# Patient Record
Sex: Male | Born: 1960 | Race: Black or African American | Hispanic: No | Marital: Married | Smoking: Former smoker
Health system: Southern US, Community
[De-identification: ages and names within clinical notes are randomized; demographics above are authoritative.]

## PROBLEM LIST (undated history)

## (undated) ENCOUNTER — Emergency Department (HOSPITAL_COMMUNITY): Admission: EM | Payer: Federal, State, Local not specified - PPO

## (undated) DIAGNOSIS — H269 Unspecified cataract: Secondary | ICD-10-CM

## (undated) DIAGNOSIS — H409 Unspecified glaucoma: Secondary | ICD-10-CM

## (undated) DIAGNOSIS — K219 Gastro-esophageal reflux disease without esophagitis: Secondary | ICD-10-CM

## (undated) DIAGNOSIS — C801 Malignant (primary) neoplasm, unspecified: Secondary | ICD-10-CM

## (undated) DIAGNOSIS — I10 Essential (primary) hypertension: Secondary | ICD-10-CM

## (undated) DIAGNOSIS — A159 Respiratory tuberculosis unspecified: Secondary | ICD-10-CM

## (undated) DIAGNOSIS — F419 Anxiety disorder, unspecified: Secondary | ICD-10-CM

## (undated) DIAGNOSIS — F32A Depression, unspecified: Secondary | ICD-10-CM

## (undated) DIAGNOSIS — J189 Pneumonia, unspecified organism: Secondary | ICD-10-CM

## (undated) HISTORY — PX: HERNIA REPAIR: SHX51

## (undated) HISTORY — DX: Unspecified glaucoma: H40.9

## (undated) HISTORY — PX: EYE SURGERY: SHX253

## (undated) HISTORY — PX: TONSILLECTOMY: SUR1361

## (undated) HISTORY — DX: Unspecified cataract: H26.9

---

## 2004-06-12 ENCOUNTER — Ambulatory Visit: Payer: Self-pay | Admitting: Internal Medicine

## 2004-11-13 ENCOUNTER — Ambulatory Visit: Payer: Self-pay | Admitting: Internal Medicine

## 2005-05-14 ENCOUNTER — Ambulatory Visit: Payer: Self-pay | Admitting: Internal Medicine

## 2005-06-20 ENCOUNTER — Emergency Department (HOSPITAL_COMMUNITY): Admission: EM | Admit: 2005-06-20 | Discharge: 2005-06-20 | Payer: Self-pay | Admitting: Emergency Medicine

## 2005-08-26 ENCOUNTER — Emergency Department (HOSPITAL_COMMUNITY): Admission: EM | Admit: 2005-08-26 | Discharge: 2005-08-27 | Payer: Self-pay | Admitting: Emergency Medicine

## 2005-09-10 ENCOUNTER — Ambulatory Visit: Payer: Self-pay | Admitting: Internal Medicine

## 2006-03-05 ENCOUNTER — Ambulatory Visit: Payer: Self-pay | Admitting: Internal Medicine

## 2006-03-12 ENCOUNTER — Ambulatory Visit: Payer: Self-pay | Admitting: Internal Medicine

## 2006-05-08 ENCOUNTER — Ambulatory Visit: Payer: Self-pay | Admitting: Internal Medicine

## 2006-10-06 ENCOUNTER — Ambulatory Visit: Payer: Self-pay | Admitting: Internal Medicine

## 2006-12-26 ENCOUNTER — Encounter: Payer: Self-pay | Admitting: Internal Medicine

## 2006-12-26 DIAGNOSIS — I10 Essential (primary) hypertension: Secondary | ICD-10-CM | POA: Insufficient documentation

## 2006-12-26 DIAGNOSIS — J309 Allergic rhinitis, unspecified: Secondary | ICD-10-CM | POA: Insufficient documentation

## 2007-02-03 ENCOUNTER — Ambulatory Visit: Payer: Self-pay | Admitting: Internal Medicine

## 2007-04-09 ENCOUNTER — Encounter: Payer: Self-pay | Admitting: Internal Medicine

## 2007-05-04 ENCOUNTER — Ambulatory Visit: Payer: Self-pay | Admitting: Internal Medicine

## 2007-11-03 ENCOUNTER — Ambulatory Visit: Payer: Self-pay | Admitting: Internal Medicine

## 2007-11-11 ENCOUNTER — Telehealth: Payer: Self-pay | Admitting: Internal Medicine

## 2007-11-12 ENCOUNTER — Telehealth: Payer: Self-pay | Admitting: Internal Medicine

## 2008-05-04 ENCOUNTER — Telehealth: Payer: Self-pay | Admitting: Internal Medicine

## 2008-05-17 ENCOUNTER — Ambulatory Visit: Payer: Self-pay | Admitting: Internal Medicine

## 2008-05-17 DIAGNOSIS — N419 Inflammatory disease of prostate, unspecified: Secondary | ICD-10-CM | POA: Insufficient documentation

## 2008-05-17 LAB — CONVERTED CEMR LAB
Bilirubin Urine: NEGATIVE
Glucose, Urine, Semiquant: NEGATIVE
Protein, U semiquant: 30
Specific Gravity, Urine: 1.01
pH: 7.5

## 2008-07-26 ENCOUNTER — Telehealth: Payer: Self-pay | Admitting: Internal Medicine

## 2008-08-30 ENCOUNTER — Telehealth: Payer: Self-pay | Admitting: Internal Medicine

## 2008-08-31 ENCOUNTER — Encounter: Payer: Self-pay | Admitting: Internal Medicine

## 2008-11-23 ENCOUNTER — Ambulatory Visit: Payer: Self-pay | Admitting: Internal Medicine

## 2008-11-23 LAB — CONVERTED CEMR LAB
ALT: 21 units/L (ref 0–53)
AST: 24 units/L (ref 0–37)
Albumin: 3.9 g/dL (ref 3.5–5.2)
BUN: 13 mg/dL (ref 6–23)
Basophils Absolute: 0 10*3/uL (ref 0.0–0.1)
CO2: 33 meq/L — ABNORMAL HIGH (ref 19–32)
Chloride: 104 meq/L (ref 96–112)
Cholesterol: 155 mg/dL (ref 0–200)
Glucose, Bld: 90 mg/dL (ref 70–99)
HCT: 46.3 % (ref 39.0–52.0)
Hemoglobin: 15.4 g/dL (ref 13.0–17.0)
Lymphs Abs: 1.7 10*3/uL (ref 0.7–4.0)
MCV: 83.1 fL (ref 78.0–100.0)
Monocytes Absolute: 0.7 10*3/uL (ref 0.1–1.0)
Neutro Abs: 5.7 10*3/uL (ref 1.4–7.7)
PSA: 0.77 ng/mL (ref 0.10–4.00)
Platelets: 190 10*3/uL (ref 150.0–400.0)
Potassium: 4.5 meq/L (ref 3.5–5.1)
RDW: 13.6 % (ref 11.5–14.6)
Sodium: 142 meq/L (ref 135–145)
TSH: 1.06 microintl units/mL (ref 0.35–5.50)
Total Bilirubin: 0.7 mg/dL (ref 0.3–1.2)

## 2009-05-10 ENCOUNTER — Emergency Department (HOSPITAL_COMMUNITY): Admission: EM | Admit: 2009-05-10 | Discharge: 2009-05-11 | Payer: Self-pay | Admitting: Emergency Medicine

## 2009-08-12 HISTORY — PX: COLONOSCOPY: SHX174

## 2010-05-08 ENCOUNTER — Ambulatory Visit: Payer: Self-pay | Admitting: Internal Medicine

## 2010-05-08 LAB — CONVERTED CEMR LAB
ALT: 24 units/L (ref 0–53)
Albumin: 4.4 g/dL (ref 3.5–5.2)
Basophils Relative: 0.5 % (ref 0.0–3.0)
Bilirubin Urine: NEGATIVE
Bilirubin, Direct: 0.1 mg/dL (ref 0.0–0.3)
CO2: 30 meq/L (ref 19–32)
Chloride: 102 meq/L (ref 96–112)
Eosinophils Absolute: 0.1 10*3/uL (ref 0.0–0.7)
Eosinophils Relative: 1.7 % (ref 0.0–5.0)
HCT: 46.1 % (ref 39.0–52.0)
Hemoglobin: 15.5 g/dL (ref 13.0–17.0)
Ketones, urine, test strip: NEGATIVE
LDL Cholesterol: 103 mg/dL — ABNORMAL HIGH (ref 0–99)
Lymphs Abs: 2 10*3/uL (ref 0.7–4.0)
MCHC: 33.7 g/dL (ref 30.0–36.0)
MCV: 82.9 fL (ref 78.0–100.0)
Monocytes Absolute: 0.6 10*3/uL (ref 0.1–1.0)
Neutro Abs: 5 10*3/uL (ref 1.4–7.7)
Nitrite: NEGATIVE
PSA: 0.76 ng/mL (ref 0.10–4.00)
Potassium: 4.6 meq/L (ref 3.5–5.1)
RBC: 5.55 M/uL (ref 4.22–5.81)
Specific Gravity, Urine: 1.02
Total CHOL/HDL Ratio: 4
Total Protein: 7.1 g/dL (ref 6.0–8.3)
Triglycerides: 97 mg/dL (ref 0.0–149.0)
Urobilinogen, UA: 0.2
WBC: 7.8 10*3/uL (ref 4.5–10.5)

## 2010-05-15 ENCOUNTER — Ambulatory Visit: Payer: Self-pay | Admitting: Internal Medicine

## 2010-06-11 ENCOUNTER — Encounter: Payer: Self-pay | Admitting: Internal Medicine

## 2010-07-11 ENCOUNTER — Encounter (INDEPENDENT_AMBULATORY_CARE_PROVIDER_SITE_OTHER): Payer: Self-pay | Admitting: *Deleted

## 2010-07-12 ENCOUNTER — Ambulatory Visit: Payer: Self-pay | Admitting: Internal Medicine

## 2010-07-26 ENCOUNTER — Ambulatory Visit: Payer: Self-pay | Admitting: Internal Medicine

## 2010-08-20 ENCOUNTER — Telehealth: Payer: Self-pay | Admitting: Internal Medicine

## 2010-08-20 DIAGNOSIS — K429 Umbilical hernia without obstruction or gangrene: Secondary | ICD-10-CM | POA: Insufficient documentation

## 2010-09-05 ENCOUNTER — Ambulatory Visit
Admission: RE | Admit: 2010-09-05 | Discharge: 2010-09-05 | Payer: Self-pay | Source: Home / Self Care | Attending: Family Medicine | Admitting: Family Medicine

## 2010-09-11 NOTE — Letter (Signed)
Summary: Pre Visit Letter Revised  [facility]  [address] [place], [place] [address]   Phone: [phone]  Fax: [phone]        06/11/2010 MRN: [id number]  Jonathan Edwards [address] [place], [place]  [address]             Procedure Date: 12-15 at 9:30am            Dr Justin Mend to the Gastroenterology Division at [facility]    You are scheduled to see a nurse for your pre-procedure visit on 07-12-10 at 9:30am on the 3rd floor at [facility], [address]  We ask that you try to arrive at our office 15 minutes prior to your appointment time to allow for check-in.  Please take a minute to review the attached form.  If you answer "Yes" to one or more of the questions on the first page, we ask that you call the person listed at your earliest opportunity.  If you answer "No" to all of the questions, please complete the rest of the form and bring it to your appointment.    Your nurse visit will consist of discussing your medical and surgical history, your immediate family medical history, and your medications.   If you are unable to list all of your medications on the form, please bring the medication bottles to your appointment and we will list them.  We will need to be aware of both prescribed and over the counter drugs.  We will need to know exact dosage information as well.    Please be prepared to read and sign documents such as consent forms, a financial agreement, and acknowledgement forms.  If necessary, and with your consent, a friend or relative is welcome to sit-in on the nurse visit with you.  Please bring your insurance card so that we may make a copy of it.  If your insurance requires a referral to see a specialist, please bring your referral form from your primary care physician.  No co-pay is required for this nurse visit.     If you cannot keep your appointment, please call [phone] to cancel or reschedule prior to your  appointment date.  This allows [place] the opportunity to schedule an appointment for another patient in need of care.    Thank you for choosing [facility] for your medical needs.  We appreciate the opportunity to care for you.  Please visit [place] at our website  to learn more about our practice.  Sincerely, The Gastroenterology Division

## 2010-09-11 NOTE — Assessment & Plan Note (Signed)
Summary: CPX//SLM    Vital Signs:  Edwards profile:   50 year old male Height:      74 inches Weight:      233 pounds BMI:     30.02 Temp:     98.4 degrees F oral BP sitting:   110 / 70  (right arm) Cuff size:   regular  Vitals Entered By: Duard Brady LPN (May 15, 2010 9:36 AM) CC: cpx - doing ok , cough and congestion, Hypertension Management Is Edwards Diabetic? No   CC:  cpx - doing ok , cough and congestion, and Hypertension Management.  History of Present Illness: Jonathan Edwards who is seen today for an annual examination.  He has a history of treated hypertension, allergic rhinitis, and enjoys excellent health.  He continues to exercise regularly.  No concerns or complaints.  He does track.  Home blood pressure readings several times per month with normal readings.  He has had some mild URI symptoms have largely resolved.  No prior screening colonoscopy  Hypertension History:      Positive major cardiovascular risk factors include male age 75 years old or older and hypertension.  Negative major cardiovascular risk factors include non-tobacco-user status.     Allergies (verified): No Known Drug Allergies  Past History:  Past Medical History: Reviewed history from 12/26/2006 and no changes required. Allergic rhinitis Hypertension  Past Surgical History: Reviewed history from 12/26/2006 and no changes required. Tonsillectomy  Family History: Reviewed history from 05/04/2007 and no changes required.  both parents died age 36; history of hypertension, and lymphoma two brothers two sisters positive for hypertension  Social History: Reviewed history from 11/23/2008 and no changes required. Married; 52 y/o triplets 2 stepchildren Regular exercise-yes-health club 5 times per week  Review of Systems  The Edwards denies anorexia, fever, weight loss, weight gain, vision loss, decreased hearing, hoarseness, chest pain, syncope, dyspnea on exertion,  peripheral edema, prolonged cough, headaches, hemoptysis, abdominal pain, melena, hematochezia, severe indigestion/heartburn, hematuria, incontinence, genital sores, muscle weakness, suspicious skin lesions, transient blindness, difficulty walking, depression, unusual weight change, abnormal bleeding, enlarged lymph nodes, angioedema, breast masses, and testicular masses.    Physical Exam  General:  Well-developed,well-nourished,in no acute distress; alert,appropriate and cooperative throughout examination Head:  Normocephalic and atraumatic without obvious abnormalities. No apparent alopecia or balding. Eyes:  No corneal or conjunctival inflammation noted. EOMI. Perrla. Funduscopic exam benign, without hemorrhages, exudates or papilledema. Vision grossly normal. Ears:  External ear exam shows no significant lesions or deformities.  Otoscopic examination reveals clear canals, tympanic membranes are intact bilaterally without bulging, retraction, inflammation or discharge. Hearing is grossly normal bilaterally. Mouth:  Oral mucosa and oropharynx without lesions or exudates.  Teeth in good repair. Neck:  No deformities, masses, or tenderness noted. Chest Wall:  No deformities, masses, tenderness or gynecomastia noted. Breasts:  No masses or gynecomastia noted Lungs:  Normal respiratory effort, chest expands symmetrically. Lungs are clear to auscultation, no crackles or wheezes. Heart:  Normal rate and regular rhythm. S1 and S2 normal without gallop, murmur, click, rub or other extra sounds. Abdomen:  Bowel sounds positive,abdomen soft and non-tender without masses, organomegaly or hernias noted. Rectal:  No external abnormalities noted. Normal sphincter tone. No rectal masses or tenderness. Genitalia:  Testes bilaterally descended without nodularity, tenderness or masses. No scrotal masses or lesions. No penis lesions or urethral discharge. Prostate:  Prostate gland firm and smooth, no enlargement,  nodularity, tenderness, mass, asymmetry or induration. Msk:  No deformity or  scoliosis noted of thoracic or lumbar spine.   Pulses:  R and L carotid,radial,femoral,dorsalis pedis and posterior tibial pulses are full and equal bilaterally Extremities:  No clubbing, cyanosis, edema, or deformity noted with normal full range of motion of all joints.   Neurologic:  No cranial nerve deficits noted. Station and gait are normal. Plantar reflexes are down-going bilaterally. DTRs are symmetrical throughout. Sensory, motor and coordinative functions appear intact. Skin:  Intact without suspicious lesions or rashes Cervical Nodes:  No lymphadenopathy noted Axillary Nodes:  No palpable lymphadenopathy Inguinal Nodes:  No significant adenopathy Psych:  Cognition and judgment appear intact. Alert and cooperative with normal attention span and concentration. No apparent delusions, illusions, hallucinations   Impression & Recommendations:  Problem # 1:  PREVENTIVE HEALTH CARE (ICD-V70.0)  Orders: Gastroenterology Referral (GI)  Complete Medication List: 1)  Felodipine 5 Mg Tb24 (Felodipine) .... Take 1 tablet by mouth once a day 2)  Lisinopril-hydrochlorothiazide 20-12.5 Mg Tabs (Lisinopril-hydrochlorothiazide) .... Take 2 tablet by mouth once a day 3)  Klor-con M20 20 Meq Tbcr (Potassium chloride crys cr) .Marland Kitchen.. 1 once daily 4)  Cialis 10 Mg Tabs (Tadalafil) .... Uad  Other Orders: Flu Vaccine 86yrs + ([address]) Admin 1st Vaccine ([address])  Hypertension Assessment/Plan:      The Edwards's hypertensive risk group is category B: At least one risk factor (excluding diabetes) with no target organ damage.  His calculated 10 year risk of coronary heart disease is 9 %.  Today's blood pressure is 110/70.    Edwards Instructions: 1)  Please schedule a follow-up appointment in 1 year. 2)  Limit your Sodium (Salt). 3)  It is important that you exercise regularly at least 20 minutes 5 times a week. If you develop  chest pain, have severe difficulty breathing, or feel very tired , stop exercising immediately and seek medical attention. 4)  Schedule a colonoscopy/sigmoidoscopy to help detect colon cancer. 5)  Check your Blood Pressure regularly. If it is above: 150/90 you should make an appointment. Prescriptions: CIALIS 10 MG TABS (TADALAFIL) UAD  #12 x 6   Entered and Authorized by:   Gordy Savers  MD   Signed by:   Gordy Savers  MD on 05/15/2010   Method used:   Faxed to ...       [facility] (mail-order)             , [place]         Ph: [phone]       Fax: [phone]   RxID:   [id number] KLOR-CON M20 20 MEQ  TBCR (POTASSIUM CHLORIDE CRYS CR) 1 once daily  #90 x 6   Entered and Authorized by:   Gordy Savers  MD   Signed by:   Gordy Savers  MD on 05/15/2010   Method used:   Faxed to ...       [facility] (mail-order)             , [place]         Ph: [phone]       Fax: [phone]   RxID:   [id number] LISINOPRIL-HYDROCHLOROTHIAZIDE 20-12.5 MG TABS (LISINOPRIL-HYDROCHLOROTHIAZIDE) Take 2 tablet by mouth once a day  #180 x 6   Entered and Authorized by:   Gordy Savers  MD   Signed by:   Gordy Savers  MD on 05/15/2010   Method used:   Faxed to ...       [facility] (mail-order)             ,  [place]         Ph: [phone]       Fax: [phone]   RxID:   [id number] FELODIPINE 5 MG TB24 (FELODIPINE) Take 1 tablet by mouth once a day  #90 x 6   Entered and Authorized by:   Gordy Savers  MD   Signed by:   Gordy Savers  MD on 05/15/2010   Method used:   Faxed to ...       [facility] (mail-order)             , [place]         Ph: [phone]       Fax: [phone]   RxID:   [id number] CIALIS 10 MG TABS (TADALAFIL) UAD  #12 x 6   Entered and Authorized by:   Gordy Savers  MD   Signed by:   Gordy Savers  MD on 05/15/2010   Method used:   Electronically to        [facility] #[id number]* (retail)       [address] [place], [place]  [address]       Ph: [phone]       Fax: [phone]   RxID:   [id number] KLOR-CON M20 20 MEQ  TBCR (POTASSIUM CHLORIDE CRYS CR) 1 once daily  #90 x 6   Entered and Authorized by:   Gordy Savers  MD   Signed by:   Gordy Savers  MD on 05/15/2010   Method used:   Electronically to        [facility] #[id number]* (retail)       [address] [place], [place]  [address]       Ph: [phone]       Fax: [phone]   RxID:   [id number] LISINOPRIL-HYDROCHLOROTHIAZIDE 20-12.5 MG TABS (LISINOPRIL-HYDROCHLOROTHIAZIDE) Take 2 tablet by mouth once a day  #180 x 6   Entered and Authorized by:   Gordy Savers  MD   Signed by:   Gordy Savers  MD on 05/15/2010   Method used:   Electronically to        [facility] #[id number]* (retail)       [address] [place], [place]  [address]       Ph: [phone]       Fax: [phone]   RxID:   [id number] FELODIPINE 5 MG TB24 (FELODIPINE) Take 1 tablet by mouth once a day  #90 x 6   Entered and Authorized by:   Gordy Savers  MD   Signed by:   Gordy Savers  MD on 05/15/2010   Method used:   Electronically to        [facility] #[id number]* (retail)       [address] [place], [place]  [address]       Ph: [phone]       Fax: [phone]   RxID:   [id number]    Immunizations Administered:  Influenza Vaccine # 1:    Vaccine Type: Fluvax 3+    Site: left deltoid    Mfr: GlaxoSmithKline    Dose: 0.5 ml    Route: IM    Given by: Duard Brady LPN    Exp. Date: 02/09/2011    Lot #:  [id number]    VIS given: 03/06/10 version given May 15, 2010.    Physician counseled: yes  Flu Vaccine Consent Questions:    Do you have a history of severe allergic reactions to this vaccine? no    Any prior history of allergic reactions to egg and/or gelatin? no    Do you have a sensitivity to the preservative Thimersol? no     Do you have a past history of Guillan-Barre Syndrome? no    Do you currently have an acute febrile illness? no    Have you ever had a severe reaction to latex? no    Vaccine information given and explained to Edwards? yes

## 2010-09-11 NOTE — Miscellaneous (Signed)
Summary: [facility] Pervisit/prep  Clinical Lists Changes  Medications: Added new medication of MOVIPREP 100 GM  SOLR (PEG-KCL-NACL-NASULF-NA ASC-C) As per prep instructions. - Signed Rx of MOVIPREP 100 GM  SOLR (PEG-KCL-NACL-NASULF-NA ASC-C) As per prep instructions.;  #1 x 0;  Signed;  Entered by: Wyona Almas RN;  Authorized by: Hilarie Fredrickson MD;  Method used: Electronically to [id number]*, [address] [place], [place]  [address], Ph: [phone], Fax: [phone] Observations: Added new observation of NKA: T (07/12/2010 10:30)    Prescriptions: MOVIPREP 100 GM  SOLR (PEG-KCL-NACL-NASULF-NA ASC-C) As per prep instructions.  #1 x 0   Entered by:   Wyona Almas RN   Authorized by:   Hilarie Fredrickson MD   Signed by:   Wyona Almas RN on 07/12/2010   Method used:   Electronically to        [id number]* (retail)       [address] [place], [place]  [address]       Ph: [phone]       Fax: [phone]   RxID:   [id number]

## 2010-09-11 NOTE — Letter (Signed)
Summary: [facility] Instructions  [facility]  [address] [place], [place] [address]   Phone: [phone]  Fax: [phone]       OCIEL RETHERFORD    Dec 05, 1974    MRN: [id number]        Procedure Day /Date:THURSDAY  07/26/10     Arrival Time:  8:30AM      Procedure Time:  9:30AM     Location of Procedure:                    _ X_  [facility] (4th Floor)                      PREPARATION FOR COLONOSCOPY WITH MOVIPREP   Starting 5 days prior to your procedure 07/21/10 do not eat nuts, seeds, popcorn, corn, beans, peas,  salads, or any raw vegetables.  Do not take any fiber supplements (e.g. Metamucil, Citrucel, and Benefiber).  THE DAY BEFORE YOUR PROCEDURE         DATE: 07/25/10  DAY: WEDNESDAY  1.  Drink clear liquids the entire day-NO SOLID FOOD  2.  Do not drink anything colored red or purple.  Avoid juices with pulp.  No orange juice.  3.  Drink at least 64 oz. (8 glasses) of fluid/clear liquids during the day to prevent dehydration and help the prep work efficiently.  CLEAR LIQUIDS INCLUDE: Water Jello Ice Popsicles Tea (sugar ok, no milk/cream) Powdered fruit flavored drinks Coffee (sugar ok, no milk/cream) Gatorade Juice: apple, white grape, white cranberry  Lemonade Clear bullion, consomm, broth Carbonated beverages (any kind) Strained chicken noodle soup Hard Candy                             4.  In the morning, mix first dose of MoviPrep solution:    Empty 1 Pouch A and 1 Pouch B into the disposable container    Add lukewarm drinking water to the top line of the container. Mix to dissolve    Refrigerate (mixed solution should be used within 24 hrs)  5.  Begin drinking the prep at 5:00 p.m. The MoviPrep container is divided by 4 marks.   Every 15 minutes drink the solution down to the next mark (approximately 8 oz) until the full liter is complete.   6.  Follow completed prep with 16 oz of clear liquid of your choice  (Nothing red or purple).  Continue to drink clear liquids until bedtime.  7.  Before going to bed, mix second dose of MoviPrep solution:    Empty 1 Pouch A and 1 Pouch B into the disposable container    Add lukewarm drinking water to the top line of the container. Mix to dissolve    Refrigerate  THE DAY OF YOUR PROCEDURE      DATE: 07/26/10  DAY: THURSDAY  Beginning at 4:30AM (5 hours before procedure):         1. Every 15 minutes, drink the solution down to the next mark (approx 8 oz) until the full liter is complete.  2. Follow completed prep with 16 oz. of clear liquid of your choice.    3. You may drink clear liquids until 7:30AM (2 HOURS BEFORE PROCEDURE).   MEDICATION INSTRUCTIONS  Unless otherwise instructed, you should take regular prescription medications with a small sip of water   as early as possible the morning of your procedure.  Additional medication instructions: Hold Lisinopril/HCTZ the morning of procedure.         OTHER INSTRUCTIONS  You will need a responsible adult at least 52 years of age to accompany you and drive you home.   This person must remain in the waiting room during your procedure.  Wear loose fitting clothing that is easily removed.  Leave jewelry and other valuables at home.  However, you may wish to bring a book to read or  an iPod/MP3 player to listen to music as you wait for your procedure to start.  Remove all body piercing jewelry and leave at home.  Total time from sign-in until discharge is approximately 2-3 hours.  You should go home directly after your procedure and rest.  You can resume normal activities the  day after your procedure.  The day of your procedure you should not:   Drive   Make legal decisions   Operate machinery   Drink alcohol   Return to work  You will receive specific instructions about eating, activities and medications before you leave.    The above instructions have been reviewed and  explained to me by   Wyona Almas RN  July 12, 2010 10:56 AM     I fully understand and can verbalize these instructions _____________________________ Date _________

## 2010-09-11 NOTE — Letter (Signed)
Summary: Out of Work  Adult nurse at [employer]  [address]   [place], [place] [address]   Phone: [phone]  Fax: [phone]    May 15, 2010   Employee:  Jonathan Edwards    To Whom It May Concern:   For Medical reasons, please excuse the above named employee from work for the following dates:  Start:   05-13-10  End:   05-16-10  If you need additional information, please feel free to contact our office.         Sincerely,    Gordy Savers  MD

## 2010-09-13 NOTE — Procedures (Signed)
Summary: Colonoscopy  Patient: Jonathan Edwards Note: All result statuses are Final unless otherwise noted.  Tests: (1) Colonoscopy (COL)   COL Colonoscopy           DONE     [facility]     [address]     [place], [place]  [address]           COLONOSCOPY PROCEDURE REPORT           PATIENT:  Jonathan, Edwards  MR#:  [id number]     BIRTHDATE:  August 14, 1960, 49 yrs. old  GENDER:  male     ENDOSCOPIST:  Wilhemina Bonito. Eda Keys, MD     REF. BY:  Eleonore Chiquito, M.D.     PROCEDURE DATE:  07/26/2010     PROCEDURE:  Average-risk screening colonoscopy     G0121     ASA CLASS:  Class II     INDICATIONS:  Routine Risk Screening     MEDICATIONS:   Fentanyl 87.5 mcg IV, Versed 7.5 mg IV           DESCRIPTION OF PROCEDURE:   After the risks benefits and     alternatives of the procedure were thoroughly explained, informed     consent was obtained.  Digital rectal exam was performed and     revealed no abnormalities.   The LB 180AL [id number] endoscope was     introduced through the anus and advanced to the cecum, which was     identified by both the appendix and ileocecal valve, without     limitations.Time to cecum = 1:46 min.  The quality of the prep was     excellent, using MoviPrep.  The instrument was then slowly     withdrawn (time = 9:39 min) as the colon was fully examined.     <<PROCEDUREIMAGES>>           FINDINGS:  A normal appearing cecum, ileocecal valve, and     appendiceal orifice were identified. The ascending, hepatic     flexure, transverse, splenic flexure, descending, sigmoid colon,     and rectum appeared unremarkable.  No polyps or cancers were seen.     Retroflexed views in the rectum revealed small internal     hemorrhoids.    The scope was then withdrawn from the patient and     the procedure completed.           COMPLICATIONS:  None     ENDOSCOPIC IMPRESSION:     1) Normal colon     2) No polyps or cancers           RECOMMENDATIONS:     1) Continue current  colorectal screening recommendations for     "routine risk" patients with a repeat colonoscopy in 10 years.           ______________________________     Wilhemina Bonito. Eda Keys, MD           CC:  Gordy Savers, MD; The Patient           n.     eSIGNED:   Wilhemina Bonito. Eda Keys at 07/26/2010 09:44 AM           Paulette Blanch, [phone]  Note: An exclamation mark (!) indicates a result that was not dispersed into the flowsheet. Document Creation Date: 07/26/2010 9:45 AM _______________________________________________________________________  (1) Order result status: Final Collection or observation date-time: 07/26/2010 09:40 Requested date-time:  Receipt date-time:  Reported date-time:  Referring Physician:   Ordering Physician: Fransico Setters [phone]) Specimen Source:  Source: Launa Grill Order Number: [id number] Lab site:   Appended Document: Colonoscopy    Clinical Lists Changes  Observations: Added new observation of COLONNXTDUE: 07/2020 (07/26/2010 11:02)

## 2010-09-13 NOTE — Assessment & Plan Note (Signed)
Summary: leg pain/muscle tight/some swelling/cjr   Vital Signs:  Patient profile:   50 year old male Weight:      233 pounds Temp:     98.2 degrees F oral BP sitting:   110 / 84  (left arm) Cuff size:   large  Vitals Entered By: Sid Falcon LPN (September 05, 2010 3:57 PM)  History of Present Illness: Patient seen left thigh pain which started 2 days ago. He woke with pain Monday morning. No injury. No clear edema. Took Advil some relief. Pain is mild rated 2/10 severity. Sore quality which is worse after prolonged periods of sitting. Improves some after initial movement. No pain at rest. No associated back pain. No skin color changes. Improved since onset 2 days ago. No leg edema.  Allergies (verified): No Known Drug Allergies  Past History:  Past Medical History: Last updated: 12/26/2006 Allergic rhinitis Hypertension PMH reviewed for relevance  Physical Exam  General:  Well-developed,well-nourished,in no acute distress; alert,appropriate and cooperative throughout examination Lungs:  Normal respiratory effort, chest expands symmetrically. Lungs are clear to auscultation, no crackles or wheezes. Heart:  Normal rate and regular rhythm. S1 and S2 normal without gallop, murmur, click, rub or other extra sounds. Abdomen:  soft and non-tender.   Extremities:  no evidence for any edema legs or ankles and no visible edema thigh region. no  skin color changes. Patient has good dorsalis pedis pulse bilaterally. Some pain with adduction against resistence. Neurologic:  alert & oriented X3, cranial nerves II-XII intact, strength normal in all extremities, sensation intact to light touch, and DTRs symmetrical and normal.   Skin:  no rashes and no suspicious lesions.     Impression & Recommendations:  Problem # 1:  THIGH PAIN (ICD-729.5) suspect MSK.  No evidence for edema. Cont NSAID.  This seems to be improving.  Complete Medication List: 1)  Felodipine 5 Mg Tb24 (Felodipine)  .... Take 1 tablet by mouth once a day 2)  Lisinopril-hydrochlorothiazide 20-12.5 Mg Tabs (Lisinopril-hydrochlorothiazide) .... Take 2 tablet by mouth once a day 3)  Klor-con M20 20 Meq Tbcr (Potassium chloride crys cr) .Marland Kitchen.. 1 once daily 4)  Cialis 10 Mg Tabs (Tadalafil) .... Uad  Patient Instructions: 1)  Continue Advil or Aleve as needed  2)  Warm water soaks daily. 3)  Gentle stretches as instructed. 4)  Follow up with Dr Kirtland Bouchard if no better in 2-3 weeks and sooner as needed    Orders Added: 1)  Est. Patient Level III [30865]

## 2010-09-13 NOTE — Progress Notes (Signed)
Summary: surgical referral  Phone Note Call from Patient   Caller: Patient Call For: Jonathan Savers  MD Summary of Call: Umbilical hernia and wants a surgical referral. [phone] Initial call taken by: [facility] CMA AAMA,  August 20, 2010 8:57 AM  Follow-up for Phone Call        ok Follow-up by: Jonathan Savers  MD,  August 20, 2010 12:52 PM  New Problems: HERNIA, UMBILICAL (ICD-553.1)   New Problems: HERNIA, UMBILICAL (ICD-553.1)

## 2010-10-19 ENCOUNTER — Other Ambulatory Visit: Payer: Self-pay | Admitting: Internal Medicine

## 2010-10-19 MED ORDER — LISINOPRIL-HYDROCHLOROTHIAZIDE 20-12.5 MG PO TABS: 1.0000 | ORAL_TABLET | Freq: Every day | ORAL | Status: DC

## 2010-10-19 NOTE — Telephone Encounter (Signed)
Triage vm-----refill Lisinopril 20/12.5mg . Please send to [facility]---[phone].

## 2010-11-13 ENCOUNTER — Telehealth: Payer: Self-pay | Admitting: *Deleted

## 2010-11-13 MED ORDER — SULFACETAMIDE SODIUM 10 % OP SOLN: 2.0000 [drp] | Freq: Four times a day (QID) | OPHTHALMIC | Status: AC

## 2010-11-13 NOTE — Telephone Encounter (Signed)
Pt would like Rx called in for a stye to [facility] [place].

## 2010-11-13 NOTE — Telephone Encounter (Signed)
Called pt - wk# - TIMEWARNER CABLE - UNABLE TO REACH PT AT THAT NUMBER - called home # - spoke with wife - informed about rx sent to [facility]

## 2010-11-13 NOTE — Telephone Encounter (Signed)
Generic Bleph 10 ophthalmic drops. 5 cc apply 2 drops to the eye 4 times daily;  Ask  patient to apply warm compresses to the affected eye also 4 times daily

## 2010-11-16 LAB — CBC
HCT: 45.2 % (ref 39.0–52.0)
Platelets: 217 10*3/uL (ref 150–400)
RDW: 13.2 % (ref 11.5–15.5)
WBC: 10.4 10*3/uL (ref 4.0–10.5)

## 2010-11-16 LAB — DIFFERENTIAL
Eosinophils Relative: 2 % (ref 0–5)
Lymphocytes Relative: 26 % (ref 12–46)
Monocytes Absolute: 0.8 10*3/uL (ref 0.1–1.0)
Monocytes Relative: 8 % (ref 3–12)
Neutro Abs: 6.6 10*3/uL (ref 1.7–7.7)

## 2010-11-16 LAB — COMPREHENSIVE METABOLIC PANEL
AST: 26 U/L (ref 0–37)
Albumin: 3.8 g/dL (ref 3.5–5.2)
Alkaline Phosphatase: 51 U/L (ref 39–117)
BUN: 12 mg/dL (ref 6–23)
Creatinine, Ser: 1.24 mg/dL (ref 0.4–1.5)
GFR calc Af Amer: >60 mL/min (ref >60)
Potassium: 3.7 mEq/L (ref 3.5–5.1)
Total Protein: 6.4 g/dL (ref 6.0–8.3)

## 2010-11-16 LAB — URINALYSIS, ROUTINE W REFLEX MICROSCOPIC
Glucose, UA: NEGATIVE mg/dL
Hgb urine dipstick: NEGATIVE
Specific Gravity, Urine: 1.014 (ref 1.005–1.030)

## 2010-12-28 NOTE — Assessment & Plan Note (Signed)
[facility]                              [facility] OFFICE NOTE   NAME:Strausbaugh, Yusuke                       MRN:          [id number]  DATE:03/12/2006                            DOB:          12-26-1960    A 51 year old gentleman who is seen today for a wellness exam.  He has  hypertension, seasonal allergic rhinitis.  He otherwise does quite well.  He  has no concerns or complaints today.  He was hospitalized at age 21 with  unclear kidney problems.  He has had a remote T&A.   REVIEW OF SYSTEMS:  Negative.   SOCIAL HISTORY:  He is the father of five children including 14-year-old  triplets.   FAMILY HISTORY:  Both parents died at 6.  His mother had lymphoma.  Both  had hypertension.  Two brothers and two sisters are in good health.  Family  history of hypertension.   EXAMINATION:  GENERAL:  Well-developed, mildly overweight male, no acute  distress.  VITAL SIGNS:  Blood pressure was 120/80-86.  HEENT:  Fundi, ear, nose and throat clear.  NECK:  No bruits or adenopathy.  CHEST:  Clear.  CARDIOVASCULAR:  Normal heart sounds, no murmurs.  ABDOMEN:  Benign, no organomegaly.  GENITALIA:  External genitalia normal.  RECTAL:  Prostate benign.  Stool heme-negative.  EXTREMITIES:  Full peripheral pulses, no edema.   IMPRESSION:  Hypertension.  She has allergic rhinitis, mild exogenous  obesity.   RECOMMENDATIONS:  I have asked the patient to attempt to lose 20-25 pounds.  His medical regimen unchanged.  He will continue to track his blood  pressures at home.  Will reassess in one year.                                   Gordy Savers, MD   PFK/MedQ  DD:  03/12/2006  DT:  03/12/2006  Job #:  [id number]

## 2011-07-19 ENCOUNTER — Other Ambulatory Visit: Payer: Self-pay | Admitting: Internal Medicine

## 2011-08-15 ENCOUNTER — Other Ambulatory Visit: Payer: Self-pay | Admitting: Internal Medicine

## 2011-10-12 ENCOUNTER — Other Ambulatory Visit: Payer: Self-pay | Admitting: Internal Medicine

## 2011-10-17 ENCOUNTER — Other Ambulatory Visit: Payer: Self-pay | Admitting: Internal Medicine

## 2011-12-11 ENCOUNTER — Other Ambulatory Visit: Payer: Self-pay | Admitting: Internal Medicine

## 2011-12-20 ENCOUNTER — Other Ambulatory Visit (INDEPENDENT_AMBULATORY_CARE_PROVIDER_SITE_OTHER): Payer: 59

## 2011-12-20 DIAGNOSIS — Z0000 Encounter for general adult medical examination without abnormal findings: Secondary | ICD-10-CM

## 2011-12-20 LAB — BASIC METABOLIC PANEL
CO2: 27 mEq/L (ref 19–32)
Chloride: 102 mEq/L (ref 96–112)
Glucose, Bld: 78 mg/dL (ref 70–99)
Potassium: 4.5 mEq/L (ref 3.5–5.1)
Sodium: 139 mEq/L (ref 135–145)

## 2011-12-20 LAB — CBC WITH DIFFERENTIAL/PLATELET
Basophils Absolute: 0 10*3/uL (ref 0.0–0.1)
HCT: 47.1 % (ref 39.0–52.0)
Hemoglobin: 15.3 g/dL (ref 13.0–17.0)
Lymphs Abs: 2.2 10*3/uL (ref 0.7–4.0)
MCHC: 32.5 g/dL (ref 30.0–36.0)
MCV: 82.5 fl (ref 78.0–100.0)
Monocytes Relative: 9.5 % (ref 3.0–12.0)
Neutro Abs: 5.5 10*3/uL (ref 1.4–7.7)
RDW: 14.2 % (ref 11.5–14.6)

## 2011-12-20 LAB — LIPID PANEL: HDL: 36.2 mg/dL — ABNORMAL LOW (ref >39.00)

## 2011-12-20 LAB — TSH: TSH: 1.87 u[IU]/mL (ref 0.35–5.50)

## 2011-12-20 LAB — POCT URINALYSIS DIPSTICK
Bilirubin, UA: NEGATIVE
Ketones, UA: NEGATIVE
Leukocytes, UA: NEGATIVE
pH, UA: 6.5

## 2011-12-20 LAB — HEPATIC FUNCTION PANEL
AST: 94 U/L — ABNORMAL HIGH (ref 0–37)
Albumin: 3.9 g/dL (ref 3.5–5.2)

## 2011-12-20 LAB — PSA: PSA: 1.04 ng/mL (ref 0.10–4.00)

## 2012-01-08 ENCOUNTER — Encounter: Payer: Self-pay | Admitting: Internal Medicine

## 2012-01-08 ENCOUNTER — Ambulatory Visit (INDEPENDENT_AMBULATORY_CARE_PROVIDER_SITE_OTHER): Payer: 59 | Admitting: Internal Medicine

## 2012-01-08 VITALS — BP 112/80 | HR 74 | Temp 98.1°F | Resp 20 | Ht 74.0 in | Wt 247.0 lb

## 2012-01-08 DIAGNOSIS — I10 Essential (primary) hypertension: Secondary | ICD-10-CM

## 2012-01-08 DIAGNOSIS — Z0000 Encounter for general adult medical examination without abnormal findings: Secondary | ICD-10-CM

## 2012-01-08 DIAGNOSIS — J309 Allergic rhinitis, unspecified: Secondary | ICD-10-CM

## 2012-01-08 MED ORDER — FELODIPINE ER 5 MG PO TB24: 5.0000 mg | ORAL_TABLET | Freq: Every day | ORAL | Status: DC

## 2012-01-08 MED ORDER — LISINOPRIL-HYDROCHLOROTHIAZIDE 20-12.5 MG PO TABS: 1.0000 | ORAL_TABLET | Freq: Every day | ORAL | Status: DC

## 2012-01-08 NOTE — Progress Notes (Signed)
Subjective:    Patient ID: Jonathan Edwards, male    DOB: November 04, 1960, 51 y.o.   MRN: [id number]  HPI  51 year-old patient who is seen today for a preventive health examination. He has treated hypertension history of allergic rhinitis and does quite well. He doesn't hear to a regular exercise regimen. He eats healthy. He did have a screening colonoscopy at age 51. No concerns or complaints today. Laboratory studies reviewed  Past Surgical History:  Reviewed history from 12/26/2006 and no changes required.  Tonsillectomy   Family History:  Reviewed history from 05/04/2007 and no changes required.  both parents died age 4; history of hypertension, and lymphoma  two brothers two sisters positive for hypertension   Social History:  Reviewed history from 11/23/2008 and no changes required.  Married;  53 y/o triplets  2 stepchildren  Regular exercise-yes-health club 5 times per week      No past medical history on file.  History   Social History  . Marital Status: Married    Spouse Name: N/A    Number of Children: N/A  . Years of Education: N/A   Occupational History  . Not on file.   Social History Main Topics  . Smoking status: Former Smoker    Quit date: 08/12/1998  . Smokeless tobacco: Never Used  . Alcohol Use: No  . Drug Use: No  . Sexually Active: Not on file   Other Topics Concern  . Not on file   Social History Narrative  . No narrative on file    No past surgical history on file.  No family history on file.  No Known Allergies  Current Outpatient Prescriptions on File Prior to Visit  Medication Sig Dispense Refill  . felodipine (PLENDIL) 5 MG 24 hr tablet TAKE 1 TABLET DAILY (NEED APPOINTMENT)  30 tablet  0  . lisinopril-hydrochlorothiazide (PRINZIDE,ZESTORETIC) 20-12.5 MG per tablet TAKE 1 TABLET DAILY (MUST BE SEEN FOR FUTURE REFILLS-LAST SEEN 08/31/10)  30 tablet  0    BP 112/80  Pulse 74  Temp(Src) 98.1 F (36.7 C) (Oral)  Resp 20  Ht 6\' 2"   (1.88 m)  Wt 247 lb (112.038 kg)  BMI 31.71 kg/m2  SpO2 98%     Review of Systems  Constitutional: Negative for fever, chills, activity change, appetite change and fatigue.  HENT: Negative for hearing loss, ear pain, congestion, rhinorrhea, sneezing, mouth sores, trouble swallowing, neck pain, neck stiffness, dental problem, voice change, sinus pressure and tinnitus.   Eyes: Negative for photophobia, pain, redness and visual disturbance.  Respiratory: Negative for apnea, cough, choking, chest tightness, shortness of breath and wheezing.   Cardiovascular: Negative for chest pain, palpitations and leg swelling.  Gastrointestinal: Negative for nausea, vomiting, abdominal pain, diarrhea, constipation, blood in stool, abdominal distention, anal bleeding and rectal pain.  Genitourinary: Negative for dysuria, urgency, frequency, hematuria, flank pain, decreased urine volume, discharge, penile swelling, scrotal swelling, difficulty urinating, genital sores and testicular pain.  Musculoskeletal: Negative for myalgias, back pain, joint swelling, arthralgias and gait problem.  Skin: Negative for color change, rash and wound.  Neurological: Negative for dizziness, tremors, seizures, syncope, facial asymmetry, speech difficulty, weakness, light-headedness, numbness and headaches.  Hematological: Negative for adenopathy. Does not bruise/bleed easily.  Psychiatric/Behavioral: Negative for suicidal ideas, hallucinations, behavioral problems, confusion, sleep disturbance, self-injury, dysphoric mood, decreased concentration and agitation. The patient is not nervous/anxious.        Objective:   Physical Exam  Constitutional: He appears well-developed and well-nourished.  HENT:  Head: Normocephalic and atraumatic.  Right Ear: External ear normal.  Left Ear: External ear normal.  Nose: Nose normal.  Mouth/Throat: Oropharynx is clear and moist.  Eyes: Conjunctivae and EOM are normal. Pupils are equal,  round, and reactive to light. No scleral icterus.  Neck: Normal range of motion. Neck supple. No JVD present. No thyromegaly present.  Cardiovascular: Regular rhythm, normal heart sounds and intact distal pulses.  Exam reveals no gallop and no friction rub.   No murmur heard.      Diminished left dorsalis pedis pulse  Pulmonary/Chest: Effort normal and breath sounds normal. He exhibits no tenderness.  Abdominal: Soft. Bowel sounds are normal. He exhibits no distension and no mass. There is no tenderness.  Genitourinary: Prostate normal and penis normal.  Musculoskeletal: Normal range of motion. He exhibits no edema and no tenderness.  Lymphadenopathy:    He has no cervical adenopathy.  Neurological: He is alert. He has normal reflexes. No cranial nerve deficit. Coordination normal.  Skin: Skin is warm and dry. No rash noted.  Psychiatric: He has a normal mood and affect. His behavior is normal.          Assessment & Plan:   Hypertension well controlled Preventive health examination  Low-salt diet more regular exercise modest weight loss all encouraged. Home blood pressure monitoring recommended. We'll recheck in one year

## 2012-01-08 NOTE — Patient Instructions (Signed)
Limit your sodium (Salt) intake  Please check your blood pressure on a regular basis.  If it is consistently greater than 150/90, please make an office appointment.    It is important that you exercise regularly, at least 20 minutes 3 to 4 times per week.  If you develop chest pain or shortness of breath seek  medical attention.  You need to lose weight.  Consider a lower calorie diet and regular exercise.  Return in one year for follow-up DASH Diet The DASH diet stands for "Dietary Approaches to Stop Hypertension." It is a healthy eating plan that has been shown to reduce high blood pressure (hypertension) in as little as 14 days, while also possibly providing other significant health benefits. These other health benefits include reducing the risk of breast cancer after menopause and reducing the risk of type 2 diabetes, heart disease, colon cancer, and stroke. Health benefits also include weight loss and slowing kidney failure in patients with chronic kidney disease.   DIET GUIDELINES  Limit salt (sodium). Your diet should contain less than 1500 mg of sodium daily.   Limit refined or processed carbohydrates. Your diet should include mostly whole grains. Desserts and added sugars should be used sparingly.   Include small amounts of heart-healthy fats. These types of fats include nuts, oils, and tub margarine. Limit saturated and trans fats. These fats have been shown to be harmful in the body.  CHOOSING FOODS   The following food groups are based on a 2000 calorie diet. See your Registered Dietitian for individual calorie needs. Grains and Grain Products (6 to 8 servings daily)  Eat More Often: Whole-wheat bread, brown rice, whole-grain or wheat pasta, quinoa, popcorn without added fat or salt (air popped).   Eat Less Often: White bread, white pasta, white rice, cornbread.  Vegetables (4 to 5 servings daily)  Eat More Often: Fresh, frozen, and canned vegetables. Vegetables may be raw,  steamed, roasted, or grilled with a minimal amount of fat.   Eat Less Often/Avoid: Creamed or fried vegetables. Vegetables in a cheese sauce.  Fruit (4 to 5 servings daily)  Eat More Often: All fresh, canned (in natural juice), or frozen fruits. Dried fruits without added sugar. One hundred percent fruit juice ( cup [237 mL] daily).   Eat Less Often: Dried fruits with added sugar. Canned fruit in light or heavy syrup.  [employer], Fish, and Poultry (2 servings or less daily. One serving is 3 to 4 oz [85-114 g]).  Eat More Often: Ninety percent or leaner ground beef, tenderloin, sirloin. Round cuts of beef, chicken breast, [place] breast. All fish. Grill, bake, or broil your meat. Nothing should be fried.   Eat Less Often/Avoid: Fatty cuts of meat, [place], or chicken leg, thigh, or wing. Fried cuts of meat or fish.  Dairy (2 to 3 servings)  Eat More Often: Low-fat or fat-free milk, low-fat plain or light yogurt, reduced-fat or part-skim cheese.   Eat Less Often/Avoid: Milk (whole, 2%, skim, or chocolate). Whole milk yogurt. Full-fat cheeses.  Nuts, Seeds, and Legumes (4 to 5 servings per week)  Eat More Often: All without added salt.   Eat Less Often/Avoid: Salted nuts and seeds, canned beans with added salt.  Fats and Sweets (limited)  Eat More Often: Vegetable oils, tub margarines without trans fats, sugar-free gelatin. Mayonnaise and salad dressings.   Eat Less Often/Avoid: Coconut oils, palm oils, butter, stick margarine, cream, half and half, cookies, candy, pie.  FOR MORE INFORMATION [employer]  Diet Eating Plan: www.dashdiet.org Document Released: 07/18/2011 Document Reviewed: 07/08/2011 [facility] Patient Information 2012 [place], [place].

## 2012-09-04 ENCOUNTER — Telehealth: Payer: Self-pay | Admitting: Internal Medicine

## 2012-09-04 ENCOUNTER — Telehealth: Payer: Self-pay | Admitting: Family Medicine

## 2012-09-04 ENCOUNTER — Encounter: Payer: Self-pay | Admitting: Family Medicine

## 2012-09-04 ENCOUNTER — Ambulatory Visit (INDEPENDENT_AMBULATORY_CARE_PROVIDER_SITE_OTHER): Payer: BC Managed Care – PPO | Admitting: Family Medicine

## 2012-09-04 ENCOUNTER — Ambulatory Visit (INDEPENDENT_AMBULATORY_CARE_PROVIDER_SITE_OTHER)
Admission: RE | Admit: 2012-09-04 | Discharge: 2012-09-04 | Disposition: A | Discharge status: Home / Self Care | Payer: BC Managed Care – PPO | Source: Ambulatory Visit | Attending: Family Medicine | Admitting: Family Medicine

## 2012-09-04 VITALS — BP 124/82 | HR 106 | Temp 97.6°F | Wt 249.0 lb

## 2012-09-04 DIAGNOSIS — S93409A Sprain of unspecified ligament of unspecified ankle, initial encounter: Secondary | ICD-10-CM

## 2012-09-04 NOTE — Telephone Encounter (Signed)
Called and spoke with pt and pt is aware.  

## 2012-09-04 NOTE — Patient Instructions (Signed)
-  no strenuous activities  -ice and elvation  -will call you later today with xray reults - if no fracture daily alphabet exercises and follow up with your doctor in 3-4 weeks

## 2012-09-04 NOTE — Telephone Encounter (Signed)
Pt would like xray results. 

## 2012-09-04 NOTE — Telephone Encounter (Signed)
Pls advise.  

## 2012-09-04 NOTE — Telephone Encounter (Signed)
Please let him know - no fractures seen on the xray. Advised ice, ibuprofen according to instructions if needed for pain, elevation for 20-30 minutes bid, alphabet exercises daily and follow up in 3-4 weeks unless resolved. Can go to work.

## 2012-09-04 NOTE — Telephone Encounter (Signed)
See previous note

## 2012-09-04 NOTE — Progress Notes (Signed)
Chief Complaint  Patient presents with  . Ankle Injury    HPI:  Acute visit for Ankle Pain: -rolled L ankle (inversion) 4 days ago, has been walking on it since -was able to bear weight fine -had some swelling of ankle, no bruising - seems to have improved some -feels like maybe tweaked it again the day after on ice -denies: fevers, chills, weakness, inability to bear weight, prior injury -did ice it some and did some elevation   ROS: See pertinent positives and negatives per HPI.  No past medical history on file.  No family history on file.  History   Social History  . Marital Status: Married    Spouse Name: N/A    Number of Children: N/A  . Years of Education: N/A   Social History Main Topics  . Smoking status: Former Smoker    Quit date: 08/12/1998  . Smokeless tobacco: Never Used  . Alcohol Use: No  . Drug Use: No  . Sexually Active: None   Other Topics Concern  . None   Social History Narrative  . None    Current outpatient prescriptions:felodipine (PLENDIL) 5 MG 24 hr tablet, Take 1 tablet (5 mg total) by mouth daily., Disp: 90 tablet, Rfl: 5;  lisinopril-hydrochlorothiazide (PRINZIDE,ZESTORETIC) 20-12.5 MG per tablet, Take 1 tablet by mouth daily., Disp: 90 tablet, Rfl: 6  EXAM:  Filed Vitals:   09/04/12 1047  BP: 124/82  Pulse: 106  Temp: 97.6 F (36.4 C)    There is no height on file to calculate BMI.  GENERAL: vitals reviewed and listed above, alert, oriented, appears well hydrated and in no acute distress  HEENT: atraumatic, conjunttiva clear, no obvious abnormalities on inspection of external nose and ears  NECK: no obvious masses on inspection  LUNGS: clear to auscultation bilaterally, no wheezes, rales or rhonchi, good air movement  CV: HRRR, no peripheral edema  MS: moves all extremities without noticeable abnormality -gait normal -mild swelling around lateral malleoulous of L ankle, no ecchymosis -TTP of L ATF and CFL, some TTP  post L lat maleoulus -no increased laxity compare to R with ADT, negsqueeze test, no fifth MT, medial maleolar or or fibular TTP -NV intact distally   PSYCH: pleasant and cooperative, no obvious depression or anxiety  ASSESSMENT AND PLAN:  Discussed the following assessment and plan:  1. Inversion sprain of ankle  DG Ankle Complete Left   -likely mild ankle sprain - will get plain films to evaluate post lat maleolar TTP - though seems more likely related to swelling. If fx will refer to ortho. If no fx - HEP, ice, elevation and follow up. -Patient advised to return or notify a doctor immediately if symptoms worsen or persist or new concerns arise.  Patient Instructions  -no strenuous activities  -ice and elvation  -will call you later today with xray reults - if no fracture daily alphabet exercises and follow up with your doctor in 3-4 weeks     Karlee Staff R.

## 2012-09-23 ENCOUNTER — Telehealth: Payer: Self-pay | Admitting: Internal Medicine

## 2012-09-23 MED ORDER — CYCLOBENZAPRINE HCL 10 MG PO TABS: 10.0000 mg | ORAL_TABLET | Freq: Three times a day (TID) | ORAL | Status: DC | PRN

## 2012-09-23 NOTE — Telephone Encounter (Signed)
ok 

## 2012-09-23 NOTE — Telephone Encounter (Signed)
Patient Information:  Caller Name: Jonathan Edwards  Phone: [phone]  Patient: Jonathan Edwards, Jonathan Edwards  Gender: Male  DOB: 1960/12/26  Age: 52 Years  PCP: [facility])  Office Follow Up:  Does the office need to follow up with this patient?: Yes  Instructions For The Office: Pt asking for Flexeril refill,  See note.  Prefers [facility] [phone].  RN Note:  RN will check with office regarding Flexeril refill.  Symptoms  Reason For Call & Symptoms: Caller states that he would like a refill on Flexeril, only uses prn for neck discomfort, flare up 09/21/12, has h/o neck issues for several years, has not had refill in last 2 years, in the past has used mail order and has not used Insurance claims handler for this Rx.  Would like to use [facility] on [address]  phone [phone].  Already has scheduled appt in next 2 weeks.  Reviewed Health History In EMR: Yes  Reviewed Medications In EMR: Yes  Reviewed Allergies In EMR: Yes  Reviewed Surgeries / Procedures: Yes  Date of Onset of Symptoms: 09/21/2012  Treatments Tried: Flexeril, Ibuprofen  Treatments Tried Worked: Yes  Guideline(s) Used:  Neck Pain or Stiffness  Disposition Per Guideline:   See Within 2 Weeks in Office  Reason For Disposition Reached:   Neck pain is a chronic symptom (recurrent or ongoing AND lasting > 4 weeks)  Advice Given:  N/A  RN Overrode Recommendation:  Patient Already Has Appt, Document Patient  Patient has appt on 10/02/12.

## 2012-09-23 NOTE — Telephone Encounter (Signed)
Left message Rx for Flexeril was sent to pharmacy.

## 2012-09-26 NOTE — Telephone Encounter (Signed)
Spoke to pt told him refill for Flexeril was sent to pharmacy. Pt verbalized understanding and stated picked up Rx yesterday. Told him okay.

## 2012-10-02 ENCOUNTER — Ambulatory Visit: Payer: Self-pay | Admitting: Internal Medicine

## 2012-12-08 ENCOUNTER — Ambulatory Visit (INDEPENDENT_AMBULATORY_CARE_PROVIDER_SITE_OTHER): Payer: BC Managed Care – PPO | Admitting: Internal Medicine

## 2012-12-08 ENCOUNTER — Encounter: Payer: Self-pay | Admitting: Internal Medicine

## 2012-12-08 VITALS — BP 126/80 | HR 88 | Temp 98.6°F | Resp 20 | Wt 250.0 lb

## 2012-12-08 DIAGNOSIS — I10 Essential (primary) hypertension: Secondary | ICD-10-CM

## 2012-12-08 NOTE — Patient Instructions (Signed)
Take 400-600 mg of ibuprofen ( Advil, Motrin) with food every 4 to 6 hours as needed for pain relief Lateral Epicondylitis (Tennis Elbow) with Rehab Lateral epicondylitis involves inflammation and pain around the outer portion of the elbow. The pain is caused by inflammation of the tendons in the forearm that bring back (extend) the wrist. Lateral epicondylittis is also called tennis elbow, because it is very common in tennis players. However, it may occur in any individual who extends the wrist repetitively. If lateral epicondylitis is left untreated, it may become a chronic problem. SYMPTOMS   Pain, tenderness, and inflammation on the outer (lateral) side of the elbow.  Pain or weakness with gripping activities.  Pain that increases with wrist twisting motions (playing tennis, using a screwdriver, opening a door or a jar).  Pain with lifting objects, including a coffee cup. CAUSES  Lateral epicondylitis is caused by inflammation of the tendons that extend the wrist. Causes of injury may include:  Repetitive stress and strain on the muscles and tendons that extend the wrist.  Sudden change in activity level or intensity.  Incorrect grip in racquet sports.  Incorrect grip size of racquet (often too large).  Incorrect hitting position or technique (usually backhand, leading with the elbow).  Using a racket that is too heavy. RISK INCREASES WITH:  Sports or occupations that require repetitive and/or strenuous forearm and wrist movements (tennis, squash, racquetball, carpentry).  Poor wrist and forearm strength and flexibility.  Failure to warm up properly before activity.  Resuming activity before healing, rehabilitation, and conditioning are complete. PREVENTION   Warm up and stretch properly before activity.  Maintain physical fitness:  Strength, flexibility, and endurance.  Cardiovascular fitness.  Wear and use properly fitted equipment.  Learn and use proper technique  and have a coach correct improper technique.  Wear a tennis elbow (counterforce) brace. PROGNOSIS  The course of this condition depends on the degree of the injury. If treated properly, acute cases (symptoms lasting less than 4 weeks) are often resolved in 2 to 6 weeks. Chronic (longer lasting cases) often resolve in 3 to 6 months, but may require physical therapy. RELATED COMPLICATIONS   Frequently recurring symptoms, resulting in a chronic problem. Properly treating the problem the first time decreases frequency of recurrence.  Chronic inflammation, scarring tendon degeneration, and partial tendon tear, requiring surgery.  Delayed healing or resolution of symptoms. TREATMENT  Treatment first involves the use of ice and medicine, to reduce pain and inflammation. Strengthening and stretching exercises may help reduce discomfort, if performed regularly. These exercises may be performed at home, if the condition is an acute injury. Chronic cases may require a referral to a physical therapist for evaluation and treatment. Your caregiver may advise a corticosteroid injection, to help reduce inflammation. Rarely, surgery is needed. MEDICATION  If pain medicine is needed, nonsteroidal anti-inflammatory medicines (aspirin and ibuprofen), or other minor pain relievers (acetaminophen), are often advised.  Do not take pain medicine for 7 days before surgery.  Prescription pain relievers may be given, if your caregiver thinks they are needed. Use only as directed and only as much as you need.  Corticosteroid injections may be recommended. These injections should be reserved only for the most severe cases, because they can only be given a certain number of times. HEAT AND COLD  Cold treatment (icing) should be applied for 10 to 15 minutes every 2 to 3 hours for inflammation and pain, and immediately after activity that aggravates your symptoms. Use ice  packs or an ice massage.  Heat treatment may be  used before performing stretching and strengthening activities prescribed by your caregiver, physical therapist, or athletic trainer. Use a heat pack or a warm water soak. SEEK MEDICAL CARE IF: Symptoms get worse or do not improve in 2 weeks, despite treatment. EXERCISES  RANGE OF MOTION (ROM) AND STRETCHING EXERCISES - Epicondylitis, Lateral (Tennis Elbow) These exercises may help you when beginning to rehabilitate your injury. Your symptoms may go away with or without further involvement from your physician, physical therapist or athletic trainer. While completing these exercises, remember:   Restoring tissue flexibility helps normal motion to return to the joints. This allows healthier, less painful movement and activity.  An effective stretch should be held for at least 30 seconds.  A stretch should never be painful. You should only feel a gentle lengthening or release in the stretched tissue. RANGE OF MOTION  Wrist Flexion, Active-Assisted  Extend your right / left elbow with your fingers pointing down.*  Gently pull the back of your hand towards you, until you feel a gentle stretch on the top of your forearm.  Hold this position for __________ seconds. Repeat __________ times. Complete this exercise __________ times per day.  *If directed by your physician, physical therapist or athletic trainer, complete this stretch with your elbow bent, rather than extended. RANGE OF MOTION  Wrist Extension, Active-Assisted  Extend your right / left elbow and turn your palm upwards.*  Gently pull your palm and fingertips back, so your wrist extends and your fingers point more toward the ground.  You should feel a gentle stretch on the inside of your forearm.  Hold this position for __________ seconds. Repeat __________ times. Complete this exercise __________ times per day. *If directed by your physician, physical therapist or athletic trainer, complete this stretch with your elbow bent,  rather than extended. STRETCH - Wrist Flexion  Place the back of your right / left hand on a tabletop, leaving your elbow slightly bent. Your fingers should point away from your body.  Gently press the back of your hand down onto the table by straightening your elbow. You should feel a stretch on the top of your forearm.  Hold this position for __________ seconds. Repeat __________ times. Complete this stretch __________ times per day.  STRETCH  Wrist Extension   Place your right / left fingertips on a tabletop, leaving your elbow slightly bent. Your fingers should point backwards.  Gently press your fingers and palm down onto the table by straightening your elbow. You should feel a stretch on the inside of your forearm.  Hold this position for __________ seconds. Repeat __________ times. Complete this stretch __________ times per day.  STRENGTHENING EXERCISES - Epicondylitis, Lateral (Tennis Elbow) These exercises may help you when beginning to rehabilitate your injury. They may resolve your symptoms with or without further involvement from your physician, physical therapist or athletic trainer. While completing these exercises, remember:   Muscles can gain both the endurance and the strength needed for everyday activities through controlled exercises.  Complete these exercises as instructed by your physician, physical therapist or athletic trainer. Increase the resistance and repetitions only as guided.  You may experience muscle soreness or fatigue, but the pain or discomfort you are trying to eliminate should never worsen during these exercises. If this pain does get worse, stop and make sure you are following the directions exactly. If the pain is still present after adjustments, discontinue the exercise until you  can discuss the trouble with your caregiver. STRENGTH Wrist Flexors  Sit with your right / left forearm palm-up and fully supported on a table or countertop. Your elbow  should be resting below the height of your shoulder. Allow your wrist to extend over the edge of the surface.  Loosely holding a __________ weight, or a piece of rubber exercise band or tubing, slowly curl your hand up toward your forearm.  Hold this position for __________ seconds. Slowly lower the wrist back to the starting position in a controlled manner. Repeat __________ times. Complete this exercise __________ times per day.  STRENGTH  Wrist Extensors  Sit with your right / left forearm palm-down and fully supported on a table or countertop. Your elbow should be resting below the height of your shoulder. Allow your wrist to extend over the edge of the surface.  Loosely holding a __________ weight, or a piece of rubber exercise band or tubing, slowly curl your hand up toward your forearm.  Hold this position for __________ seconds. Slowly lower the wrist back to the starting position in a controlled manner. Repeat __________ times. Complete this exercise __________ times per day.  STRENGTH - Ulnar Deviators  Stand with a ____________________ weight in your right / left hand, or sit while holding a rubber exercise band or tubing, with your healthy arm supported on a table or countertop.  Move your wrist, so that your pinkie travels toward your forearm and your thumb moves away from your forearm.  Hold this position for __________ seconds and then slowly lower the wrist back to the starting position. Repeat __________ times. Complete this exercise __________ times per day STRENGTH - Radial Deviators  Stand with a ____________________ weight in your right / left hand, or sit while holding a rubber exercise band or tubing, with your injured arm supported on a table or countertop.  Raise your hand upward in front of you or pull up on the rubber tubing.  Hold this position for __________ seconds and then slowly lower the wrist back to the starting position. Repeat __________ times.  Complete this exercise __________ times per day. STRENGTH  Forearm Supinators   Sit with your right / left forearm supported on a table, keeping your elbow below shoulder height. Rest your hand over the edge, palm down.  Gently grip a hammer or a soup ladle.  Without moving your elbow, slowly turn your palm and hand upward to a "thumbs-up" position.  Hold this position for __________ seconds. Slowly return to the starting position. Repeat __________ times. Complete this exercise __________ times per day.  STRENGTH  Forearm Pronators   Sit with your right / left forearm supported on a table, keeping your elbow below shoulder height. Rest your hand over the edge, palm up.  Gently grip a hammer or a soup ladle.  Without moving your elbow, slowly turn your palm and hand upward to a "thumbs-up" position.  Hold this position for __________ seconds. Slowly return to the starting position. Repeat __________ times. Complete this exercise __________ times per day.  STRENGTH - Grip  Grasp a tennis ball, a dense sponge, or a large, rolled sock in your hand.  Squeeze as hard as you can, without increasing any pain.  Hold this position for __________ seconds. Release your grip slowly. Repeat __________ times. Complete this exercise __________ times per day.  STRENGTH - Elbow Extensors, Isometric  Stand or sit upright, on a firm surface. Place your right / left arm so that  your palm faces your stomach, and it is at the height of your waist.  Place your opposite hand on the underside of your forearm. Gently push up as your right / left arm resists. Push as hard as you can with both arms, without causing any pain or movement at your right / left elbow. Hold this stationary position for __________ seconds. Gradually release the tension in both arms. Allow your muscles to relax completely before repeating. Document Released: 07/29/2005 Document Revised: 10/21/2011 Document Reviewed:  11/10/2008 [facility] Patient Information 2013 [place], [place].

## 2012-12-08 NOTE — Progress Notes (Signed)
  Subjective:    Patient ID: Jonathan Edwards, male    DOB: 1961-01-04, 52 y.o.   MRN: [id number]  HPI  52 year old patient who has treated hypertension. He presents with a two-month history of right lateral elbow pain. More recently this seems to be improving. He has been using ibuprofen. He has also been using a brace just distal to the elbow region. This has been quite helpful  History reviewed. No pertinent past medical history.  History   Social History  . Marital Status: Married    Spouse Name: N/A    Number of Children: N/A  . Years of Education: N/A   Occupational History  . Not on file.   Social History Main Topics  . Smoking status: Former Smoker    Quit date: 08/12/1998  . Smokeless tobacco: Never Used  . Alcohol Use: No  . Drug Use: No  . Sexually Active: Not on file   Other Topics Concern  . Not on file   Social History Narrative  . No narrative on file    History reviewed. No pertinent past surgical history.  No family history on file.  No Known Allergies  Current Outpatient Prescriptions on File Prior to Visit  Medication Sig Dispense Refill  . cyclobenzaprine (FLEXERIL) 10 MG tablet Take 1 tablet (10 mg total) by mouth 3 (three) times daily as needed for muscle spasms.  30 tablet  0  . felodipine (PLENDIL) 5 MG 24 hr tablet Take 1 tablet (5 mg total) by mouth daily.  90 tablet  5  . lisinopril-hydrochlorothiazide (PRINZIDE,ZESTORETIC) 20-12.5 MG per tablet Take 1 tablet by mouth daily.  90 tablet  6   No current facility-administered medications on file prior to visit.    BP 126/80  Pulse 88  Temp(Src) 98.6 F (37 C) (Oral)  Resp 20  Wt 250 lb (113.399 kg)  BMI 32.08 kg/m2  SpO2 98%       Review of Systems  Constitutional: Negative for fever, chills, appetite change and fatigue.  HENT: Negative for hearing loss, ear pain, congestion, sore throat, trouble swallowing, neck stiffness, dental problem, voice change and tinnitus.   Eyes:  Negative for pain, discharge and visual disturbance.  Respiratory: Negative for cough, chest tightness, wheezing and stridor.   Cardiovascular: Negative for chest pain, palpitations and leg swelling.  Gastrointestinal: Negative for nausea, vomiting, abdominal pain, diarrhea, constipation, blood in stool and abdominal distention.  Genitourinary: Negative for urgency, hematuria, flank pain, discharge, difficulty urinating and genital sores.  Musculoskeletal: Negative for myalgias, back pain, joint swelling, arthralgias and gait problem.       Right lateral elbow pain  Skin: Negative for rash.  Neurological: Negative for dizziness, syncope, speech difficulty, weakness, numbness and headaches.  Hematological: Negative for adenopathy. Does not bruise/bleed easily.  Psychiatric/Behavioral: Negative for behavioral problems and dysphoric mood. The patient is not nervous/anxious.        Objective:   Physical Exam  Constitutional: He appears well-developed and well-nourished. No distress.  Blood pressure well controlled  Musculoskeletal:  Tenderness right lateral elbow region          Assessment & Plan:   Right lateral epicondylitis. Information dispensed we'll continue to use his brace.  The patient was given information concerning strengthening and exercises. A local lidocaine/Depo-Medrol injection offered but he wishes to defer at this time since he seems to be improving. Hypertension well controlled

## 2013-01-01 ENCOUNTER — Other Ambulatory Visit: Payer: Self-pay

## 2013-01-01 ENCOUNTER — Other Ambulatory Visit: Payer: 59

## 2013-01-07 ENCOUNTER — Other Ambulatory Visit (INDEPENDENT_AMBULATORY_CARE_PROVIDER_SITE_OTHER): Payer: BC Managed Care – PPO

## 2013-01-07 DIAGNOSIS — Z0000 Encounter for general adult medical examination without abnormal findings: Secondary | ICD-10-CM

## 2013-01-07 LAB — POCT URINALYSIS DIPSTICK
Bilirubin, UA: NEGATIVE
Blood, UA: NEGATIVE
Nitrite, UA: NEGATIVE
Spec Grav, UA: 1.02
Urobilinogen, UA: 0.2
pH, UA: 7

## 2013-01-07 LAB — CBC WITH DIFFERENTIAL/PLATELET
Basophils Relative: 0.3 % (ref 0.0–3.0)
Eosinophils Relative: 1.3 % (ref 0.0–5.0)
HCT: 41.9 % (ref 39.0–52.0)
Hemoglobin: 14.1 g/dL (ref 13.0–17.0)
Lymphs Abs: 1.9 10*3/uL (ref 0.7–4.0)
MCV: 79.8 fl (ref 78.0–100.0)
Monocytes Absolute: 0.6 10*3/uL (ref 0.1–1.0)
RBC: 5.26 Mil/uL (ref 4.22–5.81)
WBC: 7.7 10*3/uL (ref 4.5–10.5)

## 2013-01-07 LAB — TSH: TSH: 1.29 u[IU]/mL (ref 0.35–5.50)

## 2013-01-07 LAB — HEPATIC FUNCTION PANEL
ALT: 26 U/L (ref 0–53)
AST: 24 U/L (ref 0–37)
Total Protein: 6.1 g/dL (ref 6.0–8.3)

## 2013-01-07 LAB — LIPID PANEL
HDL: 33.9 mg/dL — ABNORMAL LOW (ref >39.00)
LDL Cholesterol: 96 mg/dL (ref 0–99)
Total CHOL/HDL Ratio: 5

## 2013-01-07 LAB — PSA: PSA: 1.1 ng/mL (ref 0.10–4.00)

## 2013-01-07 LAB — BASIC METABOLIC PANEL
Chloride: 106 mEq/L (ref 96–112)
Potassium: 4.1 mEq/L (ref 3.5–5.1)
Sodium: 137 mEq/L (ref 135–145)

## 2013-01-08 ENCOUNTER — Ambulatory Visit (INDEPENDENT_AMBULATORY_CARE_PROVIDER_SITE_OTHER): Payer: BC Managed Care – PPO | Admitting: Internal Medicine

## 2013-01-08 ENCOUNTER — Encounter: Payer: 59 | Admitting: Internal Medicine

## 2013-01-08 ENCOUNTER — Encounter: Payer: Self-pay | Admitting: Internal Medicine

## 2013-01-08 VITALS — BP 138/90 | HR 91 | Temp 98.5°F | Resp 20 | Ht 73.5 in | Wt 253.0 lb

## 2013-01-08 DIAGNOSIS — I10 Essential (primary) hypertension: Secondary | ICD-10-CM

## 2013-01-08 DIAGNOSIS — Z0000 Encounter for general adult medical examination without abnormal findings: Secondary | ICD-10-CM

## 2013-01-08 MED ORDER — CYCLOBENZAPRINE HCL 10 MG PO TABS: 10.0000 mg | ORAL_TABLET | Freq: Three times a day (TID) | ORAL | Status: DC | PRN

## 2013-01-08 MED ORDER — LISINOPRIL-HYDROCHLOROTHIAZIDE 20-12.5 MG PO TABS: 1.0000 | ORAL_TABLET | Freq: Every day | ORAL | Status: DC

## 2013-01-08 MED ORDER — FELODIPINE ER 5 MG PO TB24: 5.0000 mg | ORAL_TABLET | Freq: Every day | ORAL | Status: DC

## 2013-01-08 NOTE — Patient Instructions (Signed)
Limit your sodium (Salt) intake    It is important that you exercise regularly, at least 20 minutes 3 to 4 times per week.  If you develop chest pain or shortness of breath seek  medical attention.  You need to lose weight.  Consider a lower calorie diet and regular exercise.  DASH Diet The DASH diet stands for "Dietary Approaches to Stop Hypertension." It is a healthy eating plan that has been shown to reduce high blood pressure (hypertension) in as little as 14 days, while also possibly providing other significant health benefits. These other health benefits include reducing the risk of breast cancer after menopause and reducing the risk of type 2 diabetes, heart disease, colon cancer, and stroke. Health benefits also include weight loss and slowing kidney failure in patients with chronic kidney disease.  DIET GUIDELINES  Limit salt (sodium). Your diet should contain less than 1500 mg of sodium daily.  Limit refined or processed carbohydrates. Your diet should include mostly whole grains. Desserts and added sugars should be used sparingly.  Include small amounts of heart-healthy fats. These types of fats include nuts, oils, and tub margarine. Limit saturated and trans fats. These fats have been shown to be harmful in the body. CHOOSING FOODS  The following food groups are based on a 2000 calorie diet. See your Registered Dietitian for individual calorie needs. Grains and Grain Products (6 to 8 servings daily)  Eat More Often: Whole-wheat bread, brown rice, whole-grain or wheat pasta, quinoa, popcorn without added fat or salt (air popped).  Eat Less Often: White bread, white pasta, white rice, cornbread. Vegetables (4 to 5 servings daily)  Eat More Often: Fresh, frozen, and canned vegetables. Vegetables may be raw, steamed, roasted, or grilled with a minimal amount of fat.  Eat Less Often/Avoid: Creamed or fried vegetables. Vegetables in a cheese sauce. Fruit (4 to 5 servings  daily)  Eat More Often: All fresh, canned (in natural juice), or frozen fruits. Dried fruits without added sugar. One hundred percent fruit juice ( cup [237 mL] daily).  Eat Less Often: Dried fruits with added sugar. Canned fruit in light or heavy syrup. Lean Meats, Fish, and Poultry (2 servings or less daily. One serving is 3 to 4 oz [85-114 g]).  Eat More Often: Ninety percent or leaner ground beef, tenderloin, sirloin. Round cuts of beef, chicken breast, turkey breast. All fish. Grill, bake, or broil your meat. Nothing should be fried.  Eat Less Often/Avoid: Fatty cuts of meat, turkey, or chicken leg, thigh, or wing. Fried cuts of meat or fish. Dairy (2 to 3 servings)  Eat More Often: Low-fat or fat-free milk, low-fat plain or light yogurt, reduced-fat or part-skim cheese.  Eat Less Often/Avoid: Milk (whole, 2%).Whole milk yogurt. Full-fat cheeses. Nuts, Seeds, and Legumes (4 to 5 servings per week)  Eat More Often: All without added salt.  Eat Less Often/Avoid: Salted nuts and seeds, canned beans with added salt. Fats and Sweets (limited)  Eat More Often: Vegetable oils, tub margarines without trans fats, sugar-free gelatin. Mayonnaise and salad dressings.  Eat Less Often/Avoid: Coconut oils, palm oils, butter, stick margarine, cream, half and half, cookies, candy, pie. FOR MORE INFORMATION The Dash Diet Eating Plan: www.dashdiet.org Document Released: 07/18/2011 Document Revised: 10/21/2011 Document Reviewed: 07/18/2011 [employer] Patient Information 2014 [employer].  

## 2013-01-08 NOTE — Progress Notes (Signed)
Subjective:    Patient ID: Jonathan Edwards, male    DOB: February 21, 1961, 52 y.o.   MRN: [id number]  HPI  52 year old patient who is seen today for a preventive health examination  Wt Readings from Last 3 Encounters:  01/08/13 253 lb (114.76 kg)  12/08/12 250 lb (113.399 kg)  09/04/12 249 lb (112.946 kg)    Colonoscopy at age 52  Past Surgical History:  Reviewed history from 12/26/2006 and no changes required.  Tonsillectomy   Family History:  Reviewed history from 05/04/2007 and no changes required.  both parents died age 10; history of hypertension, and lymphoma  two brothers two sisters positive for hypertension   Social History:  Reviewed history from 11/23/2008 and no changes required.  Married;  54 y/o triplets  2 stepchildren  Regular exercise-[employer] 5 times per week        History reviewed. No pertinent past medical history.  History   Social History  . Marital Status: Married    Spouse Name: N/A    Number of Children: N/A  . Years of Education: N/A   Occupational History  . Not on file.   Social History Main Topics  . Smoking status: Former Smoker    Quit date: 08/12/1998  . Smokeless tobacco: Never Used  . Alcohol Use: No  . Drug Use: No  . Sexually Active: Not on file   Other Topics Concern  . Not on file   Social History Narrative  . No narrative on file    History reviewed. No pertinent past surgical history.  No family history on file.  No Known Allergies  No current outpatient prescriptions on file prior to visit.   No current facility-administered medications on file prior to visit.    BP 138/90  Pulse 91  Temp(Src) 98.5 F (36.9 C) (Oral)  Resp 20  Ht 6' 1.5" (1.867 m)  Wt 253 lb (114.76 kg)  BMI 32.92 kg/m2  SpO2 98%       Review of Systems  Constitutional: Negative for fever, chills, activity change, appetite change and fatigue.  HENT: Negative for hearing loss, ear pain, congestion, rhinorrhea, sneezing,  mouth sores, trouble swallowing, neck pain, neck stiffness, dental problem, voice change, sinus pressure and tinnitus.   Eyes: Negative for photophobia, pain, redness and visual disturbance.  Respiratory: Negative for apnea, cough, choking, chest tightness, shortness of breath and wheezing.   Cardiovascular: Negative for chest pain, palpitations and leg swelling.  Gastrointestinal: Negative for nausea, vomiting, abdominal pain, diarrhea, constipation, blood in stool, abdominal distention, anal bleeding and rectal pain.  Genitourinary: Negative for dysuria, urgency, frequency, hematuria, flank pain, decreased urine volume, discharge, penile swelling, scrotal swelling, difficulty urinating, genital sores and testicular pain.  Musculoskeletal: Negative for myalgias, back pain, joint swelling, arthralgias and gait problem.  Skin: Negative for color change, rash and wound.  Neurological: Negative for dizziness, tremors, seizures, syncope, facial asymmetry, speech difficulty, weakness, light-headedness, numbness and headaches.  Hematological: Negative for adenopathy. Does not bruise/bleed easily.  Psychiatric/Behavioral: Negative for suicidal ideas, hallucinations, behavioral problems, confusion, sleep disturbance, self-injury, dysphoric mood, decreased concentration and agitation. The patient is not nervous/anxious.        Objective:   Physical Exam  Constitutional: He appears well-developed and well-nourished.  HENT:  Head: Normocephalic and atraumatic.  Right Ear: External ear normal.  Left Ear: External ear normal.  Nose: Nose normal.  Mouth/Throat: Oropharynx is clear and moist.  Eyes: Conjunctivae and EOM are normal. Pupils are equal, round, and reactive  to light. No scleral icterus.  Neck: Normal range of motion. Neck supple. No JVD present. No thyromegaly present.  Cardiovascular: Regular rhythm, normal heart sounds and intact distal pulses.  Exam reveals no gallop and no friction rub.    No murmur heard. Pulmonary/Chest: Effort normal and breath sounds normal. He exhibits no tenderness.  Abdominal: Soft. Bowel sounds are normal. He exhibits no distension and no mass. There is no tenderness.  Genitourinary: Prostate normal and penis normal.  Musculoskeletal: Normal range of motion. He exhibits no edema and no tenderness.  Mild soft tissue swelling left lateral ankle  Lymphadenopathy:    He has no cervical adenopathy.  Neurological: He is alert. He has normal reflexes. No cranial nerve deficit. Coordination normal.  Skin: Skin is warm and dry. No rash noted.  Psychiatric: He has a normal mood and affect. His behavior is normal.          Assessment & Plan:   Preventive health exam Hypertension stable Exogenous obesity. Weight loss encouraged  Medicines updated Home blood pressure monitoring encouraged Recheck 1 year

## 2013-01-14 ENCOUNTER — Encounter: Payer: Self-pay | Admitting: Internal Medicine

## 2013-06-17 ENCOUNTER — Other Ambulatory Visit: Payer: Self-pay

## 2013-12-28 ENCOUNTER — Ambulatory Visit (INDEPENDENT_AMBULATORY_CARE_PROVIDER_SITE_OTHER): Payer: BC Managed Care – PPO | Admitting: Internal Medicine

## 2013-12-28 ENCOUNTER — Encounter: Payer: Self-pay | Admitting: Internal Medicine

## 2013-12-28 VITALS — BP 130/90 | HR 78 | Temp 98.5°F | Resp 20 | Ht 73.5 in | Wt 253.0 lb

## 2013-12-28 DIAGNOSIS — B353 Tinea pedis: Secondary | ICD-10-CM

## 2013-12-28 DIAGNOSIS — I10 Essential (primary) hypertension: Secondary | ICD-10-CM

## 2013-12-28 DIAGNOSIS — J309 Allergic rhinitis, unspecified: Secondary | ICD-10-CM

## 2013-12-28 MED ORDER — TERBINAFINE HCL 1 % EX CREA: 1.0000 "application " | TOPICAL_CREAM | Freq: Two times a day (BID) | CUTANEOUS | Status: DC

## 2013-12-28 NOTE — Patient Instructions (Addendum)
Limit your sodium (Salt) intake  Please check your blood pressure on a regular basis.  If it is consistently greater than 150/90, please make an office appointment.  Return in one year for follow-up Athlete's Foot Athlete's foot (tinea pedis) is a fungal infection of the skin on the feet. It often occurs on the skin between the toes or underneath the toes. It can also occur on the soles of the feet. Athlete's foot is more likely to occur in hot, humid weather. Not washing your feet or changing your socks often enough can contribute to athlete's foot. The infection can spread from person to person (contagious). CAUSES Athlete's foot is caused by a fungus. This fungus thrives in warm, moist places. Most people get athlete's foot by sharing shower stalls, towels, and wet floors with an infected person. People with weakened immune systems, including those with diabetes, may be more likely to get athlete's foot. SYMPTOMS   Itchy areas between the toes or on the soles of the feet.  White, flaky, or scaly areas between the toes or on the soles of the feet.  Tiny, intensely itchy blisters between the toes or on the soles of the feet.  Tiny cuts on the skin. These cuts can develop a bacterial infection.  Thick or discolored toenails. DIAGNOSIS  Your caregiver can usually tell what the problem is by doing a physical exam. Your caregiver may also take a skin sample from the rash area. The skin sample may be examined under a microscope, or it may be tested to see if fungus will grow in the sample. A sample may also be taken from your toenail for testing. TREATMENT  Over-the-counter and prescription medicines can be used to kill the fungus. These medicines are available as powders or creams. Your caregiver can suggest medicines for you. Fungal infections respond slowly to treatment. You may need to continue using your medicine for several weeks. PREVENTION   Do not share towels.  Wear sandals in wet  areas, such as shared locker rooms and shared showers.  Keep your feet dry. Wear shoes that allow air to circulate. Wear cotton or wool socks. HOME CARE INSTRUCTIONS   Take medicines as directed by your caregiver. Do not use steroid creams on athlete's foot.  Keep your feet clean and cool. Wash your feet daily and dry them thoroughly, especially between your toes.  Change your socks every day. Wear cotton or wool socks. In hot climates, you may need to change your socks 2 to 3 times per day.  Wear sandals or canvas tennis shoes with good air circulation.  If you have blisters, soak your feet in Burow's solution or Epsom salts for 20 to 30 minutes, 2 times a day to dry out the blisters. Make sure you dry your feet thoroughly afterward. SEEK MEDICAL CARE IF:   You have a fever.  You have swelling, soreness, warmth, or redness in your foot.  You are not getting better after 7 days of treatment.  You are not completely cured after 30 days.  You have any problems caused by your medicines. MAKE SURE YOU:   Understand these instructions.  Will watch your condition.  Will get help right away if you are not doing well or get worse. Document Released: 07/26/2000 Document Revised: 10/21/2011 Document Reviewed: 05/17/2011 [facility] Patient Information 2014 [place], [place].

## 2013-12-28 NOTE — Progress Notes (Signed)
Subjective:    Patient ID: Jonathan Edwards, male    DOB: 1960-12-23, 53 y.o.   MRN: [id number]  HPI  53 year old patient who has a history of treated hypertension.  He presents with a chief complaint of dry, flaky itchy skin.  Especially between the toes.  He has had what he feels has been athletes feet for a number of months.  He has tried some over-the-counter remedies without much success.  Otherwise, doing quite well  History reviewed. No pertinent past medical history.  History   Social History  . Marital Status: Married    Spouse Name: N/A    Number of Children: N/A  . Years of Education: N/A   Occupational History  . Not on file.   Social History Main Topics  . Smoking status: Former Smoker    Quit date: 08/12/1998  . Smokeless tobacco: Never Used  . Alcohol Use: No  . Drug Use: No  . Sexual Activity: Not on file   Other Topics Concern  . Not on file   Social History Narrative  . No narrative on file    History reviewed. No pertinent past surgical history.  No family history on file.  No Known Allergies  Current Outpatient Prescriptions on File Prior to Visit  Medication Sig Dispense Refill  . cyclobenzaprine (FLEXERIL) 10 MG tablet Take 1 tablet (10 mg total) by mouth 3 (three) times daily as needed for muscle spasms.  90 tablet  3  . felodipine (PLENDIL) 5 MG 24 hr tablet Take 1 tablet (5 mg total) by mouth daily.  90 tablet  3  . lisinopril-hydrochlorothiazide (PRINZIDE,ZESTORETIC) 20-12.5 MG per tablet Take 1 tablet by mouth daily.  90 tablet  3   No current facility-administered medications on file prior to visit.    BP 130/90  Pulse 78  Temp(Src) 98.5 F (36.9 C) (Oral)  Resp 20  Ht 6' 1.5" (1.867 m)  Wt 253 lb (114.76 kg)  BMI 32.92 kg/m2  SpO2 98%     Review of Systems  Constitutional: Negative for fever, chills, appetite change and fatigue.  HENT: Negative for congestion, dental problem, ear pain, hearing loss, sore throat, tinnitus,  trouble swallowing and voice change.   Eyes: Negative for pain, discharge and visual disturbance.  Respiratory: Negative for cough, chest tightness, wheezing and stridor.   Cardiovascular: Negative for chest pain, palpitations and leg swelling.  Gastrointestinal: Negative for nausea, vomiting, abdominal pain, diarrhea, constipation, blood in stool and abdominal distention.  Genitourinary: Negative for urgency, hematuria, flank pain, discharge, difficulty urinating and genital sores.  Musculoskeletal: Negative for arthralgias, back pain, gait problem, joint swelling, myalgias and neck stiffness.  Skin: Positive for rash.  Neurological: Negative for dizziness, syncope, speech difficulty, weakness, numbness and headaches.  Hematological: Negative for adenopathy. Does not bruise/bleed easily.  Psychiatric/Behavioral: Negative for behavioral problems and dysphoric mood. The patient is not nervous/anxious.        Objective:   Physical Exam  Constitutional: He is oriented to person, place, and time. He appears well-developed.  HENT:  Head: Normocephalic.  Right Ear: External ear normal.  Left Ear: External ear normal.  Eyes: Conjunctivae and EOM are normal.  Neck: Normal range of motion.  Cardiovascular: Normal rate and normal heart sounds.   Pulmonary/Chest: Breath sounds normal.  Abdominal: Bowel sounds are normal.  Musculoskeletal: Normal range of motion. He exhibits no edema and no tenderness.  Neurological: He is alert and oriented to person, place, and time.  Skin: Rash noted.  Macerated skin between his toes, especially between the right third and fourth and fourth and fifth toes  Psychiatric: He has a normal mood and affect. His behavior is normal.          Assessment & Plan:   Hypertension stable.  Continue present medicine restricted diet and home blood pressure monitoring.  No change in therapy Tinea pedis.  Will treat with antifungal therapy

## 2013-12-28 NOTE — Progress Notes (Signed)
Pre-visit discussion using our clinic review tool. No additional management support is needed unless otherwise documented below in the visit note.  

## 2013-12-29 ENCOUNTER — Telehealth: Payer: Self-pay | Admitting: Internal Medicine

## 2013-12-29 NOTE — Telephone Encounter (Signed)
Relevant patient education assigned to patient using Emmi. ° °

## 2014-01-31 ENCOUNTER — Other Ambulatory Visit: Payer: Self-pay | Admitting: Internal Medicine

## 2014-02-02 ENCOUNTER — Telehealth: Payer: Self-pay | Admitting: Internal Medicine

## 2014-02-02 NOTE — Telephone Encounter (Signed)
Pt called to say that over the counter meds is not happen his feet he is still having the issues with cracked and moisture he is req a rx for something to help  Pharmacy [facility] on [address]

## 2014-02-03 NOTE — Telephone Encounter (Signed)
Please call in a prescription for terbenafine 250 mg   #14 one daily

## 2014-02-03 NOTE — Telephone Encounter (Signed)
Please advise 

## 2014-02-04 MED ORDER — TERBINAFINE HCL 250 MG PO TABS: 250.0000 mg | ORAL_TABLET | Freq: Every day | ORAL | Status: DC

## 2014-02-04 NOTE — Telephone Encounter (Signed)
Left detailed message on personal voicemail Rx sent to pharmacy. 

## 2014-04-01 ENCOUNTER — Other Ambulatory Visit: Payer: Self-pay | Admitting: Internal Medicine

## 2014-04-26 ENCOUNTER — Encounter: Payer: Self-pay | Admitting: Internal Medicine

## 2014-05-24 ENCOUNTER — Ambulatory Visit (INDEPENDENT_AMBULATORY_CARE_PROVIDER_SITE_OTHER): Payer: BC Managed Care – PPO | Admitting: Internal Medicine

## 2014-05-24 ENCOUNTER — Encounter: Payer: Self-pay | Admitting: Internal Medicine

## 2014-05-24 VITALS — BP 110/70 | HR 93 | Temp 98.3°F | Resp 20 | Ht 73.5 in | Wt 252.0 lb

## 2014-05-24 DIAGNOSIS — I10 Essential (primary) hypertension: Secondary | ICD-10-CM

## 2014-05-24 DIAGNOSIS — J302 Other seasonal allergic rhinitis: Secondary | ICD-10-CM

## 2014-05-24 NOTE — Progress Notes (Signed)
Pre visit review using our clinic review tool, if applicable. No additional management support is needed unless otherwise documented below in the visit note. 

## 2014-05-24 NOTE — Progress Notes (Signed)
   Subjective:    Patient ID: Jonathan Edwards, male    DOB: Aug 02, 1961, 53 y.o.   MRN: [id number]  HPI  BP Readings from Last 3 Encounters:  05/24/14 110/70  12/28/13 130/90  01/08/13 138/90   Wt Readings from Last 3 Encounters:  05/24/14 252 lb (114.306 kg)  12/28/13 253 lb (114.76 kg)  01/08/13 253 lb (114.76 kg)    Review of Systems     Objective:   Physical Exam        Assessment & Plan:

## 2014-05-24 NOTE — Patient Instructions (Signed)
Limit your sodium (Salt) intake  Please check your blood pressure on a regular basis.  If it is consistently greater than 150/90, please make an office appointment.    It is important that you exercise regularly, at least 20 minutes 3 to 4 times per week.  If you develop chest pain or shortness of breath seek  medical attention.  Return in 6 months for follow-up  Acute bronchitis symptoms  are generally not helped by antibiotics.  Take over-the-counter expectorants and cough medications such as  Mucinex DM.  Call if there is no improvement in 5 to 7 days or if  you develop worsening cough, fever, or new symptoms, such as shortness of breath or chest pain.

## 2014-05-24 NOTE — Progress Notes (Signed)
Subjective:    Patient ID: Jonathan Edwards, male    DOB: 1961-01-29, 53 y.o.   MRN: [id number]  HPI  53 year old patient with a 3 to four-day history of sinus drainage, congestion, mild sore throat, and cough.  No fever.  He has been seen by podiatry recently and is completing 10 days of amoxicillin therapy He has treated hypertension, which has been stable   History reviewed. No pertinent past medical history.  History   Social History  . Marital Status: Married    Spouse Name: N/A    Number of Children: N/A  . Years of Education: N/A   Occupational History  . Not on file.   Social History Main Topics  . Smoking status: Former Smoker    Quit date: 08/12/1998  . Smokeless tobacco: Never Used  . Alcohol Use: No  . Drug Use: No  . Sexual Activity: Not on file   Other Topics Concern  . Not on file   Social History Narrative  . No narrative on file    History reviewed. No pertinent past surgical history.  No family history on file.  No Known Allergies  Current Outpatient Prescriptions on File Prior to Visit  Medication Sig Dispense Refill  . cyclobenzaprine (FLEXERIL) 10 MG tablet Take 1 tablet (10 mg total) by mouth 3 (three) times daily as needed for muscle spasms.  90 tablet  3  . felodipine (PLENDIL) 5 MG 24 hr tablet TAKE 1 TABLET DAILY  90 tablet  3  . lisinopril-hydrochlorothiazide (PRINZIDE,ZESTORETIC) 20-12.5 MG per tablet TAKE 1 TABLET DAILY  90 tablet  3  . terbinafine (LAMISIL) 1 % cream Apply 1 application topically 2 (two) times daily.  30 g  0  . terbinafine (LAMISIL) 250 MG tablet Take 1 tablet (250 mg total) by mouth daily.  14 tablet  0   No current facility-administered medications on file prior to visit.    BP 110/70  Pulse 93  Temp(Src) 98.3 F (36.8 C) (Oral)  Resp 20  Ht 6' 1.5" (1.867 m)  Wt 252 lb (114.306 kg)  BMI 32.79 kg/m2  SpO2 98%     Review of Systems  Constitutional: Positive for activity change and appetite change.  Negative for fever, chills and fatigue.  HENT: Positive for congestion, postnasal drip, sinus pressure and sore throat. Negative for dental problem, ear pain, hearing loss, tinnitus, trouble swallowing and voice change.   Eyes: Negative for pain, discharge and visual disturbance.  Respiratory: Positive for cough. Negative for chest tightness, wheezing and stridor.   Cardiovascular: Negative for chest pain, palpitations and leg swelling.  Gastrointestinal: Negative for nausea, vomiting, abdominal pain, diarrhea, constipation, blood in stool and abdominal distention.  Genitourinary: Negative for urgency, hematuria, flank pain, discharge, difficulty urinating and genital sores.  Musculoskeletal: Negative for arthralgias, back pain, gait problem, joint swelling, myalgias and neck stiffness.  Skin: Negative for rash.  Neurological: Negative for dizziness, syncope, speech difficulty, weakness, numbness and headaches.  Hematological: Negative for adenopathy. Does not bruise/bleed easily.  Psychiatric/Behavioral: Negative for behavioral problems and dysphoric mood. The patient is not nervous/anxious.        Objective:   Physical Exam  Constitutional: He is oriented to person, place, and time. He appears well-developed.  HENT:  Head: Normocephalic.  Right Ear: External ear normal.  Left Ear: External ear normal.  Oropharynx slightly injected  Eyes: Conjunctivae and EOM are normal.  Neck: Normal range of motion.  Cardiovascular: Normal rate and normal heart sounds.  Pulmonary/Chest: Breath sounds normal.  Abdominal: Bowel sounds are normal.  Musculoskeletal: Normal range of motion. He exhibits no edema and no tenderness.  Neurological: He is alert and oriented to person, place, and time.  Psychiatric: He has a normal mood and affect. His behavior is normal.          Assessment & Plan:   Viral URI.  Will treat symptomatically  hypertension well controlled Exogenous obesity.  Weight  loss encouraged  CPX 6 months

## 2014-07-22 ENCOUNTER — Ambulatory Visit (INDEPENDENT_AMBULATORY_CARE_PROVIDER_SITE_OTHER): Payer: BC Managed Care – PPO | Admitting: Family Medicine

## 2014-07-22 ENCOUNTER — Encounter: Payer: Self-pay | Admitting: Family Medicine

## 2014-07-22 VITALS — BP 130/80 | HR 92 | Temp 97.9°F | Wt 238.0 lb

## 2014-07-22 DIAGNOSIS — M25511 Pain in right shoulder: Secondary | ICD-10-CM

## 2014-07-22 NOTE — Progress Notes (Signed)
   Subjective:    Patient ID: Jonathan Edwards, male    DOB: February 03, 1961, 53 y.o.   MRN: [id number]  HPI Patient seen with right shoulder pain progressive over several months. He is a former [employer] manager. He denies any recent injury. He started working out a month ago but has been doing more aerobic exercise and not really any lifting. His pain is very poorly localized but mostly anterior right shoulder. Denies any radiculopathy symptoms. No numbness or weakness. He has occasional night pain. His pain is moderate severity. He notices some pain with certain movements of the shoulder but not consistently reproduced.  No past medical history on file. No past surgical history on file.  reports that he quit smoking about 15 years ago. He has never used smokeless tobacco. He reports that he does not drink alcohol or use illicit drugs. family history is not on file. No Known Allergies    Review of Systems  Neurological: Negative for weakness and numbness.       Objective:   Physical Exam  Constitutional: He appears well-developed and well-nourished.  Cardiovascular: Normal rate and regular rhythm.   Musculoskeletal:  Right shoulder reveals full range of motion. No biceps tenderness. Mild AC joint tenderness. He has mild pain with adduction right shoulder in [facility] joint region.           Assessment & Plan:  Right shoulder pain. Differential is impingement syndrome and also seems to have some tenderness over AC joint. Set up sports medicine referral to further evaluate. Patient agrees with this plan.

## 2014-07-22 NOTE — Patient Instructions (Signed)

## 2014-08-16 ENCOUNTER — Other Ambulatory Visit (INDEPENDENT_AMBULATORY_CARE_PROVIDER_SITE_OTHER): Payer: BLUE CROSS/BLUE SHIELD

## 2014-08-16 ENCOUNTER — Ambulatory Visit (INDEPENDENT_AMBULATORY_CARE_PROVIDER_SITE_OTHER): Payer: BLUE CROSS/BLUE SHIELD | Admitting: Family Medicine

## 2014-08-16 ENCOUNTER — Encounter: Payer: Self-pay | Admitting: Family Medicine

## 2014-08-16 ENCOUNTER — Encounter: Payer: Self-pay | Admitting: *Deleted

## 2014-08-16 VITALS — BP 120/82 | HR 87 | Ht 74.0 in | Wt 237.0 lb

## 2014-08-16 DIAGNOSIS — M7551 Bursitis of right shoulder: Secondary | ICD-10-CM

## 2014-08-16 DIAGNOSIS — M7550 Bursitis of unspecified shoulder: Secondary | ICD-10-CM | POA: Insufficient documentation

## 2014-08-16 DIAGNOSIS — M25511 Pain in right shoulder: Secondary | ICD-10-CM

## 2014-08-16 MED ORDER — MELOXICAM 15 MG PO TABS: 15.0000 mg | ORAL_TABLET | Freq: Every day | ORAL | Status: DC

## 2014-08-16 NOTE — Progress Notes (Signed)
[id number]; 15 additional minutes spent for Therapeutic exercises as stated in above notes.  This included exercises focusing on stretching, strengthening, with significant focus on eccentric aspects.   Proper technique shown and discussed handout in great detail with ATC.  All questions were discussed and answered.

## 2014-08-16 NOTE — Assessment & Plan Note (Signed)
Patient is having impingement syndromes more secondary to the chronic changes of the labrum as well as the subacromial bursitis. Patient elected to try conservative therapy including home exercises, icing protocol, patient also given up her prescription in for meloxicam. Patient tried these conservative therapies and then come back and see me again in 3-4 weeks for further evaluation. Patient did go over with a trainer and myself for an additional 15 minutes on therapeutic exercises as stated in the note.

## 2014-08-16 NOTE — Patient Instructions (Signed)
Good to meet you Ice 20 Meloxicam daily for 10 days then as needed Minutes 2 times daily. Usually after activity and before bed. Exercises 3 times a week.  Vitamin D 2000 IU daily.  See me again in 3 weeks.

## 2014-08-16 NOTE — Progress Notes (Signed)
[facility] [address] [place], [place] [address] Phone: [phone] Subjective:    I'm seeing this patient by the request  of:  Dr. Elease Hashimoto.   CC: right shoulder pain.   [id number] Jonathan Edwards is a 54 y.o. male coming in with complaint of right shoulder pain.  Patient states that this is been an insidious onset over the course of multiple years. Patient states over the course last year it seems to be getting worse. Notices with certain daily activities. When he tries to throw he has some decrease in the loss that he as well as. Patient was a Museum/gallery curator previously. Patient denies any radiation significant down the arm but states that it as a dull throbbing pain that sometimes can make it difficult to follow sleep but does not keep him from falling asleep. Not stopping him from daily activities. Patient rates the severity of pain is 6 out of 10. No numbness or weakness with daily activities. No associated neck pain.     Past medical history, social, surgical and family history all reviewed in electronic medical record.   Review of Systems: No headache, visual changes, nausea, vomiting, diarrhea, constipation, dizziness, abdominal pain, skin rash, fevers, chills, night sweats, weight loss, swollen lymph nodes, body aches, joint swelling, muscle aches, chest pain, shortness of breath, mood changes.   Objective Blood pressure 120/82, pulse 87, height 6\' 2"  (1.88 m), weight 237 lb (107.502 kg), SpO2 96 %.  General: No apparent distress alert and oriented x3 mood and affect normal, dressed appropriately.  HEENT: Pupils equal, extraocular movements intact  Respiratory: Patient's speak in full sentences and does not appear short of breath  Cardiovascular: No lower extremity edema, non tender, no erythema  Skin: Warm dry intact with no signs of infection or rash on extremities or on axial skeleton.  Abdomen: Soft nontender  Neuro: Cranial nerves II  through XII are intact, neurovascularly intact in all extremities with 2+ DTRs and 2+ pulses.  Lymph: No lymphadenopathy of posterior or anterior cervical chain or axillae bilaterally.  Gait normal with good balance and coordination.  MSK:  Non tender with full range of motion and good stability and symmetric strength and tone of elbows, wrist, hip, knee and ankles bilaterally.  Shoulder: Right Inspection reveals no abnormalities, atrophy or asymmetry. Palpation is normal with no tenderness over AC joint or bicipital groove. ROM is full in all planes passively. Rotator cuff strength normal throughout. signs of impingement with positive Neer and Hawkin's tests, but negative empty can sign. Speeds and Yergason's tests normal. No labral pathology noted with negative Obrien's, negative clunk and good stability. Normal scapular function observed. No painful arc and no drop arm sign. No apprehension sign  MSK US performed of: Right This study was ordered, performed, and interpreted by Charlann Boxer D.O.  Shoulder:   Supraspinatus:  Appears normal on long and transverse views, Bursal bulge seen with shoulder abduction on impingement view. Infraspinatus:  Appears normal on long and transverse views. Significant increase in Doppler flow Subscapularis:  Appears normal on long and transverse views. Positive bursa Teres Minor:  Appears normal on long and transverse views. AC joint:  Capsule undistended, no geyser sign. Glenohumeral Joint:  Appears normal without effusion. Glenoid Labrum: Degenerative changes noted with thickening of the posterior capsule Biceps Tendon:  Appears normal on long and transverse views, no fraying of tendon, tendon located in intertubercular groove, no subluxation with shoulder internal or external rotation.  Impression: Subacromial  bursitis, chronic degenerative changes of the labrum      Impression and Recommendations:     This case required medical decision making  of moderate complexity.

## 2014-08-26 ENCOUNTER — Telehealth: Payer: Self-pay | Admitting: Internal Medicine

## 2014-08-26 NOTE — Telephone Encounter (Signed)
ok 

## 2014-08-26 NOTE — Telephone Encounter (Signed)
Left message on voicemail to call office.  

## 2014-08-26 NOTE — Telephone Encounter (Signed)
Pt request refill cialis 10 mg. Pt states dr Raliegh Ip prescribed in 2011, but was too costly at that time. [facility]

## 2014-08-26 NOTE — Telephone Encounter (Signed)
Please see message and advise 

## 2014-08-29 MED ORDER — TADALAFIL 10 MG PO TABS: 10.0000 mg | ORAL_TABLET | Freq: Every day | ORAL | Status: DC | PRN

## 2014-08-29 NOTE — Telephone Encounter (Signed)
Left detailed message that Rx requested was sent to [facility], could not send to [facility] call if any questions.

## 2014-09-06 ENCOUNTER — Encounter: Payer: Self-pay | Admitting: Family Medicine

## 2014-09-06 ENCOUNTER — Ambulatory Visit (INDEPENDENT_AMBULATORY_CARE_PROVIDER_SITE_OTHER): Payer: BLUE CROSS/BLUE SHIELD | Admitting: Family Medicine

## 2014-09-06 ENCOUNTER — Encounter: Payer: Self-pay | Admitting: *Deleted

## 2014-09-06 VITALS — BP 118/80 | HR 78 | Ht 74.0 in | Wt 237.0 lb

## 2014-09-06 DIAGNOSIS — M7551 Bursitis of right shoulder: Secondary | ICD-10-CM

## 2014-09-06 NOTE — Progress Notes (Signed)
  [name] [address] [place], [place] [address] Phone: [phone] Subjective:   CC: right shoulder pain. Follow-up  HAL:[id number] Jonathan Edwards is a 54 y.o. male coming in with complaint of right shoulder pain.  Patient states that this is been an insidious onset over the course of multiple years.  Patient was found to have more of a degenerative labral tear as well as a subacromial bursitis. Patient tried to do more conservative therapy. Patient was given home exercises, icing protocol, as well as topical anti-inflammatory's. Patient states that he is approximately 50% better. Patient is fairly happy with these results. Patient denies any new symptoms.     Past medical history, social, surgical and family history all reviewed in electronic medical record.   Review of Systems: No headache, visual changes, nausea, vomiting, diarrhea, constipation, dizziness, abdominal pain, skin rash, fevers, chills, night sweats, weight loss, swollen lymph nodes, body aches, joint swelling, muscle aches, chest pain, shortness of breath, mood changes.   Objective Blood pressure 118/80, pulse 78, height 6\' 2"  (1.88 m), weight 237 lb (107.502 kg), SpO2 96 %.  General: No apparent distress alert and oriented x3 mood and affect normal, dressed appropriately.  HEENT: Pupils equal, extraocular movements intact  Respiratory: Patient's speak in full sentences and does not appear short of breath  Cardiovascular: No lower extremity edema, non tender, no erythema  Skin: Warm dry intact with no signs of infection or rash on extremities or on axial skeleton.  Abdomen: Soft nontender  Neuro: Cranial nerves II through XII are intact, neurovascularly intact in all extremities with 2+ DTRs and 2+ pulses.  Lymph: No lymphadenopathy of posterior or anterior cervical chain or axillae bilaterally.  Gait normal with good balance and coordination.  MSK:  Non tender with full range of motion  and good stability and symmetric strength and tone of elbows, wrist, hip, knee and ankles bilaterally.  Shoulder: Right Inspection reveals no abnormalities, atrophy or asymmetry. Palpation is normal with no tenderness over AC joint or bicipital groove. ROM is full in all planes passively. Rotator cuff strength normal throughout. signs of impingement with positive Neer and Hawkin's tests, but negative empty can sign. Speeds and Yergason's tests normal. No labral pathology noted with negative Obrien's, negative clunk and good stability. Normal scapular function observed. No painful arc and no drop arm sign. No apprehension sign Contralateral shoulder unremarkable Exam shows no significant improvement or worsening from previous exam.       Impression and Recommendations:     This case required medical decision making of moderate complexity.

## 2014-09-06 NOTE — Progress Notes (Signed)
Pre visit review using our clinic review tool, if applicable. No additional management support is needed unless otherwise documented below in the visit note. 

## 2014-09-06 NOTE — Assessment & Plan Note (Signed)
Patient continues to have some difficulty with impingement. Patient is still having some pain. Patient was given different treatment options and has elected to continue with conservative therapy at this time. Patient will continue with the home exercises, icing protocol as well as the medications that have been previously prescribed. Patient then is to follow-up again in 3 weeks. Continuing to have pain I would significantly consider a ultrasound guided intra-articular injection. Patient though has been hesitant to try this modality. Differential includes cervical radiculopathy but I think this is unlikely.

## 2014-09-06 NOTE — Patient Instructions (Signed)
Good to see you Ice 20 minutes 2 times daily. Usually after activity and before bed. Ice bath at night for 20 minutes.  Do exercises still and really do the icing Try the pennsaid twice daily Good shoes with rigid bottom.  Jalene Mullet, Merrell or New balance greater then 700 See me again in 3 weeks. We will check the foot and the shoulder.

## 2014-09-27 ENCOUNTER — Encounter: Payer: Self-pay | Admitting: *Deleted

## 2014-09-27 ENCOUNTER — Encounter: Payer: Self-pay | Admitting: Family Medicine

## 2014-09-27 ENCOUNTER — Ambulatory Visit (INDEPENDENT_AMBULATORY_CARE_PROVIDER_SITE_OTHER): Payer: BLUE CROSS/BLUE SHIELD | Admitting: Family Medicine

## 2014-09-27 VITALS — BP 144/92 | HR 74 | Ht 74.0 in | Wt 233.0 lb

## 2014-09-27 DIAGNOSIS — M722 Plantar fascial fibromatosis: Secondary | ICD-10-CM | POA: Insufficient documentation

## 2014-09-27 DIAGNOSIS — M7551 Bursitis of right shoulder: Secondary | ICD-10-CM

## 2014-09-27 NOTE — Progress Notes (Signed)
Pre visit review using our clinic review tool, if applicable. No additional management support is needed unless otherwise documented below in the visit note. 

## 2014-09-27 NOTE — Patient Instructions (Signed)
Good to see you Ice when you need it.  Start the run protocol but no more then 2 times a week for 3 weeks.  Start a walk-run progression: - Initially start one minute walking than one minute running for  30 mins  Up to 2 times a week. Then folloo ing week.   Once you have reached 30 mins: - Run 2 mins, then walk 1 min. -Then run 3 mins, and walk 1 min. -Then run 4 mins, and walk 1 min. -Then run 5 mins, and walk 1 min. -Slowly build up weekly to running 30 mins nonstop.  If painful at any of the steps, back up one step.

## 2014-09-27 NOTE — Assessment & Plan Note (Signed)
Patient is doing significant better at this time. Encourage him to continue the exercises 2-3 times a week. We discussed continuing the icing. Patient has any worsening symptoms he will follow-up on an as-needed basis.

## 2014-09-27 NOTE — Progress Notes (Signed)
  [facility] [address] [place], [place] [address] Phone: [phone] Subjective:   CC: right shoulder pain. Follow-up  Jonathan Edwards Jonathan Edwards is a 54 y.o. male coming in with complaint of right shoulder pain. 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 Patient was seen 2 months ago and was given a steroid injection. Patient was doing approximately 50% better at his follow-up. She was encouraged to continue with conservative therapy.  Patient also has a new complaint of foot pain. Patient was having more of what seemed to be a plantar fasciitis. The last exam patient was given some exercises as well as an icing protocol when he was leaving. Patient states that this has decreased the pain by 90%. Still having some mild discomfort when he is waking up in the morning.    Past medical history, social, surgical and family history all reviewed in electronic medical record.   Review of Systems: No headache, visual changes, nausea, vomiting, diarrhea, constipation, dizziness, abdominal pain, skin rash, fevers, chills, night sweats, weight loss, swollen lymph nodes, body aches, joint swelling, muscle aches, chest pain, shortness of breath, mood changes.   Objective Blood pressure 144/92, pulse 74, height 6\' 2"  (1.88 m), weight 233 lb (105.688 kg), SpO2 97 %.  General: No apparent distress alert and oriented x3 mood and affect normal, dressed appropriately.  HEENT: Pupils equal, extraocular movements intact  Respiratory: Patient's speak in full sentences and does not appear short of breath  Cardiovascular: No lower extremity edema, non tender, no erythema  Skin: Warm dry intact with no signs of infection or rash on extremities or on axial skeleton.  Abdomen: Soft nontender  Neuro: Cranial nerves II through XII are intact, neurovascularly intact in all extremities with 2+ DTRs and 2+ pulses.  Lymph: No lymphadenopathy of  posterior or anterior cervical chain or axillae bilaterally.  Gait normal with good balance and coordination.  MSK:  Non tender with full range of motion and good stability and symmetric strength and tone of elbows, wrist, hip, knee and ankles bilaterally.  Shoulder: Right Inspection reveals no abnormalities, atrophy or asymmetry. Palpation is normal with no tenderness over AC joint or bicipital groove. ROM is full in all planes passively. Rotator cuff strength normal throughout. Negative impingement signs. Speeds and Yergason's tests normal. No labral pathology noted with negative Obrien's, negative clunk and good stability. Normal scapular function observed. No painful arc and no drop arm sign. No apprehension sign Contralateral shoulder unremarkable Exam shows no significant improvement or worsening from previous exam.  Foot exam today is unremarkable.     Impression and Recommendations:     This case required medical decision making of moderate complexity.

## 2014-09-27 NOTE — Assessment & Plan Note (Signed)
Is doing much better at this time. Encourage patient to continue the same regimen. Patient will start doing more of a running protocol and was given a running progression. At this point his lungs patient does not have any pain he can do more of a follow-up as needed. Patient continues to have discomfort at follow-up we will discuss the possibility of injection as well as custom orthotics.

## 2014-10-03 ENCOUNTER — Encounter: Payer: Self-pay | Admitting: Internal Medicine

## 2014-10-03 ENCOUNTER — Ambulatory Visit (INDEPENDENT_AMBULATORY_CARE_PROVIDER_SITE_OTHER): Payer: BLUE CROSS/BLUE SHIELD | Admitting: Internal Medicine

## 2014-10-03 VITALS — BP 128/78 | HR 73 | Temp 98.3°F | Resp 16 | Ht 74.0 in | Wt 236.8 lb

## 2014-10-03 DIAGNOSIS — J209 Acute bronchitis, unspecified: Secondary | ICD-10-CM

## 2014-10-03 MED ORDER — FLUTICASONE PROPIONATE 50 MCG/ACT NA SUSP: 2.0000 | Freq: Every day | NASAL | Status: DC

## 2014-10-03 MED ORDER — AZITHROMYCIN 250 MG PO TABS: ORAL_TABLET | ORAL | Status: DC

## 2014-10-03 NOTE — Assessment & Plan Note (Signed)
Z-pack and flonase. If no improvement will call back.

## 2014-10-03 NOTE — Progress Notes (Signed)
Pre visit review using our clinic review tool, if applicable. No additional management support is needed unless otherwise documented below in the visit note. 

## 2014-10-03 NOTE — Patient Instructions (Signed)
We will send in an antibiotic called azithromycin (or a z-pack). Take 2 pills on the first day, then 1 pill daily after that until it's gone.  The other medicine we have sent in is called flonase. This is a nose spray. Use 2 puffs in each nostril daily for the next 1-2 weeks to dry up all the congestion.   Acute Bronchitis Bronchitis is inflammation of the airways that extend from the windpipe into the lungs (bronchi). The inflammation often causes mucus to develop. This leads to a cough, which is the most common symptom of bronchitis.  In acute bronchitis, the condition usually develops suddenly and goes away over time, usually in a couple weeks. Smoking, allergies, and asthma can make bronchitis worse. Repeated episodes of bronchitis may cause further lung problems.  CAUSES Acute bronchitis is most often caused by the same virus that causes a cold. The virus can spread from person to person (contagious) through coughing, sneezing, and touching contaminated objects. SIGNS AND SYMPTOMS   Cough.   Fever.   Coughing up mucus.   Body aches.   Chest congestion.   Chills.   Shortness of breath.   Sore throat.  DIAGNOSIS  Acute bronchitis is usually diagnosed through a physical exam. Your health care provider will also ask you questions about your medical history. Tests, such as chest X-rays, are sometimes done to rule out other conditions.  TREATMENT  Acute bronchitis usually goes away in a couple weeks. Oftentimes, no medical treatment is necessary. Medicines are sometimes given for relief of fever or cough. Antibiotic medicines are usually not needed but may be prescribed in certain situations. In some cases, an inhaler may be recommended to help reduce shortness of breath and control the cough. A cool mist vaporizer may also be used to help thin bronchial secretions and make it easier to clear the chest.  HOME CARE INSTRUCTIONS  Get plenty of rest.   Drink enough fluids to  keep your urine clear or pale yellow (unless you have a medical condition that requires fluid restriction). Increasing fluids may help thin your respiratory secretions (sputum) and reduce chest congestion, and it will prevent dehydration.   Take medicines only as directed by your health care provider.  If you were prescribed an antibiotic medicine, finish it all even if you start to feel better.  Avoid smoking and secondhand smoke. Exposure to cigarette smoke or irritating chemicals will make bronchitis worse. If you are a smoker, consider using nicotine gum or skin patches to help control withdrawal symptoms. Quitting smoking will help your lungs heal faster.   Reduce the chances of another bout of acute bronchitis by washing your hands frequently, avoiding people with cold symptoms, and trying not to touch your hands to your mouth, nose, or eyes.   Keep all follow-up visits as directed by your health care provider.  SEEK MEDICAL CARE IF: Your symptoms do not improve after 1 week of treatment.  SEEK IMMEDIATE MEDICAL CARE IF:  You develop an increased fever or chills.   You have chest pain.   You have severe shortness of breath.  You have bloody sputum.   You develop dehydration.  You faint or repeatedly feel like you are going to pass out.  You develop repeated vomiting.  You develop a severe headache. MAKE SURE YOU:   Understand these instructions.  Will watch your condition.  Will get help right away if you are not doing well or get worse. Document Released: 09/05/2004 Document Revised:  12/13/2013 Document Reviewed: 01/19/2013 [employer] Patient Information 2015 [place], [place]. This information is not intended to replace advice given to you by your health care provider. Make sure you discuss any questions you have with your health care provider.

## 2014-10-03 NOTE — Progress Notes (Signed)
   Subjective:    Patient ID: Jonathan Edwards, male    DOB: 12-Jul-1961, 54 y.o.   MRN: [id number]  HPI The patient is a 54 YO man who is coming in today for wheezing. It is a new problem happening for the last 1-2 weeks. He denies SOB with walking. He does have cough although non-productive. He also is having nasal congestion. Denies headaches, ear pain. It is staying about the same and not improving. They have tried mucinex over the counter as well as alleve sinus. Denies fevers, has fatigue and maybe chills.   Review of Systems  Constitutional: Positive for chills, activity change and fatigue. Negative for fever, appetite change and unexpected weight change.  HENT: Positive for congestion and rhinorrhea. Negative for ear discharge, ear pain, postnasal drip, sinus pressure and sore throat.   Eyes: Negative.   Respiratory: Positive for cough and wheezing. Negative for chest tightness and shortness of breath.   Cardiovascular: Negative for chest pain, palpitations and leg swelling.  Gastrointestinal: Negative.   Neurological: Negative.       Objective:   Physical Exam  Constitutional: He is oriented to person, place, and time. He appears well-developed and well-nourished.  HENT:  Head: Normocephalic and atraumatic.  Right Ear: External ear normal.  Left Ear: External ear normal.  Oropharynx mildly inflamed, turbinates red and swollen  Eyes: EOM are normal.  Neck: Normal range of motion.  Cardiovascular: Normal rate and regular rhythm.   No murmur heard. Pulmonary/Chest: Effort normal. No respiratory distress. He has wheezes. He has no rales.  Diffuse wheezing and mild rhonchi. No focal wheezing or rales  Abdominal: Soft.  Neurological: He is alert and oriented to person, place, and time.  Skin: Skin is warm and dry.   Filed Vitals:   10/03/14 1509  BP: 128/78  Pulse: 73  Temp: 98.3 F (36.8 C)  TempSrc: Oral  Resp: 16  Height: 6\' 2"  (1.88 m)  Weight: 236 lb 12.8 oz (107.412  kg)  SpO2: 97%      Assessment & Plan:

## 2014-11-16 ENCOUNTER — Other Ambulatory Visit: Payer: BC Managed Care – PPO

## 2014-11-23 ENCOUNTER — Ambulatory Visit: Payer: BC Managed Care – PPO | Admitting: Internal Medicine

## 2014-12-28 ENCOUNTER — Other Ambulatory Visit (INDEPENDENT_AMBULATORY_CARE_PROVIDER_SITE_OTHER): Payer: BLUE CROSS/BLUE SHIELD

## 2014-12-28 DIAGNOSIS — Z0000 Encounter for general adult medical examination without abnormal findings: Secondary | ICD-10-CM | POA: Diagnosis not present

## 2014-12-28 LAB — BASIC METABOLIC PANEL
BUN: 13 mg/dL (ref 6–23)
CHLORIDE: 105 meq/L (ref 96–112)
CO2: 31 mEq/L (ref 19–32)
Calcium: 9.6 mg/dL (ref 8.4–10.5)
Creatinine, Ser: 1.29 mg/dL (ref 0.40–1.50)
GFR: 74.2 mL/min (ref >60.00)
Glucose, Bld: 93 mg/dL (ref 70–99)
Potassium: 4.9 mEq/L (ref 3.5–5.1)
Sodium: 140 mEq/L (ref 135–145)

## 2014-12-28 LAB — CBC WITH DIFFERENTIAL/PLATELET
BASOS ABS: 0 10*3/uL (ref 0.0–0.1)
Basophils Relative: 0.5 % (ref 0.0–3.0)
EOS PCT: 1.1 % (ref 0.0–5.0)
Eosinophils Absolute: 0.1 10*3/uL (ref 0.0–0.7)
HEMATOCRIT: 44.1 % (ref 39.0–52.0)
Hemoglobin: 14.4 g/dL (ref 13.0–17.0)
LYMPHS ABS: 1.9 10*3/uL (ref 0.7–4.0)
Lymphocytes Relative: 21.4 % (ref 12.0–46.0)
MCHC: 32.7 g/dL (ref 30.0–36.0)
MCV: 80.6 fl (ref 78.0–100.0)
MONO ABS: 0.6 10*3/uL (ref 0.1–1.0)
Monocytes Relative: 7.3 % (ref 3.0–12.0)
Neutro Abs: 6.2 10*3/uL (ref 1.4–7.7)
Neutrophils Relative %: 69.7 % (ref 43.0–77.0)
Platelets: 215 10*3/uL (ref 150.0–400.0)
RBC: 5.48 Mil/uL (ref 4.22–5.81)
RDW: 14.6 % (ref 11.5–15.5)
WBC: 8.9 10*3/uL (ref 4.0–10.5)

## 2014-12-28 LAB — LIPID PANEL
CHOLESTEROL: 150 mg/dL (ref 0–200)
HDL: 46 mg/dL (ref >39.00)
LDL Cholesterol: 93 mg/dL (ref 0–99)
NONHDL: 104
TRIGLYCERIDES: 55 mg/dL (ref 0.0–149.0)
Total CHOL/HDL Ratio: 3
VLDL: 11 mg/dL (ref 0.0–40.0)

## 2014-12-28 LAB — POCT URINALYSIS DIPSTICK
Bilirubin, UA: NEGATIVE
Blood, UA: NEGATIVE
GLUCOSE UA: NEGATIVE
KETONES UA: NEGATIVE
Leukocytes, UA: NEGATIVE
Nitrite, UA: NEGATIVE
PH UA: 6
Protein, UA: NEGATIVE
Spec Grav, UA: 1.02
Urobilinogen, UA: 0.2

## 2014-12-28 LAB — HEPATIC FUNCTION PANEL
ALT: 20 U/L (ref 0–53)
AST: 22 U/L (ref 0–37)
Albumin: 4.1 g/dL (ref 3.5–5.2)
Alkaline Phosphatase: 47 U/L (ref 39–117)
BILIRUBIN DIRECT: 0.1 mg/dL (ref 0.0–0.3)
BILIRUBIN TOTAL: 0.3 mg/dL (ref 0.2–1.2)
Total Protein: 6.7 g/dL (ref 6.0–8.3)

## 2014-12-28 LAB — PSA: PSA: 1.89 ng/mL (ref 0.10–4.00)

## 2014-12-28 LAB — TSH: TSH: 1.86 u[IU]/mL (ref 0.35–4.50)

## 2014-12-29 ENCOUNTER — Telehealth: Payer: Self-pay | Admitting: Internal Medicine

## 2014-12-29 NOTE — Telephone Encounter (Signed)
Left message on voicemail to call office.  

## 2014-12-29 NOTE — Telephone Encounter (Signed)
Pt needs note for work. Pt had blood work only. Fax to [phone]

## 2014-12-30 NOTE — Telephone Encounter (Signed)
Left message on voicemail to call office.  

## 2014-12-30 NOTE — Telephone Encounter (Signed)
Pt called back, asked him if he wants time of appointment on note and did he go to work? Pt said you can put the time on and I did go to work. Told pt okay will fax note over to [facility]. Pt verbalized understanding. Note faxed.

## 2015-01-04 ENCOUNTER — Encounter: Payer: Self-pay | Admitting: Internal Medicine

## 2015-01-04 ENCOUNTER — Ambulatory Visit (INDEPENDENT_AMBULATORY_CARE_PROVIDER_SITE_OTHER): Payer: BLUE CROSS/BLUE SHIELD | Admitting: Internal Medicine

## 2015-01-04 ENCOUNTER — Telehealth: Payer: Self-pay | Admitting: Internal Medicine

## 2015-01-04 VITALS — BP 124/90 | HR 83 | Temp 98.1°F | Resp 20 | Ht 73.25 in | Wt 238.0 lb

## 2015-01-04 DIAGNOSIS — Z0000 Encounter for general adult medical examination without abnormal findings: Secondary | ICD-10-CM | POA: Diagnosis not present

## 2015-01-04 DIAGNOSIS — I10 Essential (primary) hypertension: Secondary | ICD-10-CM | POA: Diagnosis not present

## 2015-01-04 DIAGNOSIS — Z23 Encounter for immunization: Secondary | ICD-10-CM

## 2015-01-04 NOTE — Patient Instructions (Signed)
Limit your sodium (Salt) intake  Please check your blood pressure on a regular basis.  If it is consistently greater than 150/90, please make an office appointment.    It is important that you exercise regularly, at least 20 minutes 3 to 4 times per week.  If you develop chest pain or shortness of breath seek  medical attention.  Health Maintenance A healthy lifestyle and preventative care can promote health and wellness.  Maintain regular health, dental, and eye exams.  Eat a healthy diet. Foods like vegetables, fruits, whole grains, low-fat dairy products, and lean protein foods contain the nutrients you need and are low in calories. Decrease your intake of foods high in solid fats, added sugars, and salt. Get information about a proper diet from your health care provider, if necessary.  Regular physical exercise is one of the most important things you can do for your health. Most adults should get at least 150 minutes of moderate-intensity exercise (any activity that increases your heart rate and causes you to sweat) each week. In addition, most adults need muscle-strengthening exercises on 2 or more days a week.   Maintain a healthy weight. The body mass index (BMI) is a screening tool to identify possible weight problems. It provides an estimate of body fat based on height and weight. Your health care provider can find your BMI and can help you achieve or maintain a healthy weight. For males 20 years and older:  A BMI below 18.5 is considered underweight.  A BMI of 18.5 to 24.9 is normal.  A BMI of 25 to 29.9 is considered overweight.  A BMI of 30 and above is considered obese.  Maintain normal blood lipids and cholesterol by exercising and minimizing your intake of saturated fat. Eat a balanced diet with plenty of fruits and vegetables. Blood tests for lipids and cholesterol should begin at age 33 and be repeated every 5 years. If your lipid or cholesterol levels are high, you are  over age 1, or you are at high risk for heart disease, you may need your cholesterol levels checked more frequently.Ongoing high lipid and cholesterol levels should be treated with medicines if diet and exercise are not working.  If you smoke, find out from your health care provider how to quit. If you do not use tobacco, do not start.  Lung cancer screening is recommended for adults aged 27-80 years who are at high risk for developing lung cancer because of a history of smoking. A yearly low-dose CT scan of the lungs is recommended for people who have at least a 30-pack-year history of smoking and are current smokers or have quit within the past 15 years. A pack year of smoking is smoking an average of 1 pack of cigarettes a day for 1 year (for example, a 30-pack-year history of smoking could mean smoking 1 pack a day for 30 years or 2 packs a day for 15 years). Yearly screening should continue until the smoker has stopped smoking for at least 15 years. Yearly screening should be stopped for people who develop a health problem that would prevent them from having lung cancer treatment.  If you choose to drink alcohol, do not have more than 2 drinks per day. One drink is considered to be 12 oz (360 mL) of beer, 5 oz (150 mL) of wine, or 1.5 oz (45 mL) of liquor.  Avoid the use of street drugs. Do not share needles with anyone. Ask for help if you  need support or instructions about stopping the use of drugs.  High blood pressure causes heart disease and increases the risk of stroke. Blood pressure should be checked at least every 1-2 years. Ongoing high blood pressure should be treated with medicines if weight loss and exercise are not effective.  If you are 71-23 years old, ask your health care provider if you should take aspirin to prevent heart disease.  Diabetes screening involves taking a blood sample to check your fasting blood sugar level. This should be done once every 3 years after age 68 if  you are at a normal weight and without risk factors for diabetes. Testing should be considered at a younger age or be carried out more frequently if you are overweight and have at least 1 risk factor for diabetes.  Colorectal cancer can be detected and often prevented. Most routine colorectal cancer screening begins at the age of 35 and continues through age 66. However, your health care provider may recommend screening at an earlier age if you have risk factors for colon cancer. On a yearly basis, your health care provider may provide home test kits to check for hidden blood in the stool. A small camera at the end of a tube may be used to directly examine the colon (sigmoidoscopy or colonoscopy) to detect the earliest forms of colorectal cancer. Talk to your health care provider about this at age 57 when routine screening begins. A direct exam of the colon should be repeated every 5-10 years through age 57, unless early forms of precancerous polyps or small growths are found.  People who are at an increased risk for hepatitis B should be screened for this virus. You are considered at high risk for hepatitis B if:  You were born in a country where hepatitis B occurs often. Talk with your health care provider about which countries are considered high risk.  Your parents were born in a high-risk country and you have not received a shot to protect against hepatitis B (hepatitis B vaccine).  You have HIV or AIDS.  You use needles to inject street drugs.  You live with, or have sex with, someone who has hepatitis B.  You are a man who has sex with other men (MSM).  You get hemodialysis treatment.  You take certain medicines for conditions like cancer, organ transplantation, and autoimmune conditions.  Hepatitis C blood testing is recommended for all people born from [birth year] through 1967 and any individual with known risk factors for hepatitis C.  Healthy men should no longer receive prostate-specific  antigen (PSA) blood tests as part of routine cancer screening. Talk to your health care provider about prostate cancer screening.  Testicular cancer screening is not recommended for adolescents or adult males who have no symptoms. Screening includes self-exam, a health care provider exam, and other screening tests. Consult with your health care provider about any symptoms you have or any concerns you have about testicular cancer.  Practice safe sex. Use condoms and avoid high-risk sexual practices to reduce the spread of sexually transmitted infections (STIs).  You should be screened for STIs, including gonorrhea and chlamydia if:  You are sexually active and are younger than 24 years.  You are older than 24 years, and your health care provider tells you that you are at risk for this type of infection.  Your sexual activity has changed since you were last screened, and you are at an increased risk for chlamydia or gonorrhea. Ask your health  care provider if you are at risk.  If you are at risk of being infected with HIV, it is recommended that you take a prescription medicine daily to prevent HIV infection. This is called pre-exposure prophylaxis (PrEP). You are considered at risk if:  You are a man who has sex with other men (MSM).  You are a heterosexual man who is sexually active with multiple partners.  You take drugs by injection.  You are sexually active with a partner who has HIV.  Talk with your health care provider about whether you are at high risk of being infected with HIV. If you choose to begin PrEP, you should first be tested for HIV. You should then be tested every 3 months for as long as you are taking PrEP.  Use sunscreen. Apply sunscreen liberally and repeatedly throughout the day. You should seek shade when your shadow is shorter than you. Protect yourself by wearing long sleeves, pants, a wide-brimmed hat, and sunglasses year round whenever you are outdoors.  Tell  your health care provider of new moles or changes in moles, especially if there is a change in shape or color. Also, tell your health care provider if a mole is larger than the size of a pencil eraser.  A one-time screening for abdominal aortic aneurysm (AAA) and surgical repair of large AAAs by ultrasound is recommended for men aged 23-75 years who are current or former smokers.  Stay current with your vaccines (immunizations). Document Released: 01/25/2008 Document Revised: 08/03/2013 Document Reviewed: 12/24/2010 [facility] Patient Information 2015 [place], [place]. This information is not intended to replace advice given to you by your health care provider. Make sure you discuss any questions you have with your health care provider.

## 2015-01-04 NOTE — Progress Notes (Signed)
Pre visit review using our clinic review tool, if applicable. No additional management support is needed unless otherwise documented below in the visit note. 

## 2015-01-04 NOTE — Telephone Encounter (Signed)
Pt was seen today and  needs a  work note  fax to [employer] [phone]

## 2015-01-04 NOTE — Progress Notes (Signed)
Subjective:    Patient ID: Jonathan Edwards, male    DOB: 06-Mar-1961, 54 y.o.   MRN: [id number]  HPI   Subjective:    Patient ID: Jonathan Edwards, male    DOB: 06/18/1961, 54 y.o.   MRN: [id number]  HPI  54 year old patient who is seen today for a preventive health examination  Wt Readings from Last 3 Encounters:  01/04/15 238 lb (107.956 kg)  10/03/14 236 lb 12.8 oz (107.412 kg)  09/27/14 233 lb (105.688 kg)    Colonoscopy at age 52  Past Surgical History:  Reviewed history from 12/26/2006 and no changes required.  Tonsillectomy   Family History:  Reviewed history from 05/04/2007 and no changes required.  both parents died age 75; history of hypertension, and lymphoma  two brothers two sisters positive for hypertension   Social History:  Reviewed history from 11/23/2008 and no changes required.  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  History   Social History  . Marital Status: Married    Spouse Name: N/A  . Number of Children: N/A  . Years of Education: N/A   Occupational History  . Not on file.   Social History Main Topics  . Smoking status: Former Smoker    Quit date: 08/12/1998  . Smokeless tobacco: Never Used  . Alcohol Use: No  . Drug Use: No  . Sexual Activity: Not on file   Other Topics Concern  . Not on file   Social History Narrative    No past surgical history on file.  No family history on file.  No Known Allergies  Current Outpatient Prescriptions on File Prior to Visit  Medication Sig Dispense Refill  . cyclobenzaprine (FLEXERIL) 10 MG tablet Take 1 tablet (10 mg total) by mouth 3 (three) times daily as needed for muscle spasms. 90 tablet 3  . felodipine (PLENDIL) 5 MG 24 hr tablet TAKE 1 TABLET DAILY 90 tablet 3  . fluticasone (FLONASE) 50 MCG/ACT nasal spray Place 2 sprays into both nostrils daily. 16 g 6  .  lisinopril-hydrochlorothiazide (PRINZIDE,ZESTORETIC) 20-12.5 MG per tablet TAKE 1 TABLET DAILY 90 tablet 3  . LUZU 1 % CREA Apply 1 application topically as needed.     . tadalafil (CIALIS) 10 MG tablet Take 1 tablet (10 mg total) by mouth daily as needed for erectile dysfunction. 10 tablet 2   No current facility-administered medications on file prior to visit.    BP 124/90 mmHg  Pulse 83  Temp(Src) 98.1 F (36.7 C) (Oral)  Resp 20  Ht 6' 1.25" (1.861 m)  Wt 238 lb (107.956 kg)  BMI 31.17 kg/m2  SpO2 98%       Review of Systems  Constitutional: Negative for fever, chills, activity change, appetite change and fatigue.  HENT: Negative for hearing loss, ear pain, congestion, rhinorrhea, sneezing, mouth sores, trouble swallowing, neck pain, neck stiffness, dental problem, voice change, sinus pressure and tinnitus.   Eyes: Negative for photophobia, pain, redness and visual disturbance.  Respiratory: Negative for apnea, cough, choking, chest tightness, shortness of breath and wheezing.   Cardiovascular: Negative for chest pain, palpitations and leg swelling.  Gastrointestinal: Negative for nausea, vomiting, abdominal pain, diarrhea, constipation, blood in stool, abdominal distention, anal bleeding and rectal pain.  Genitourinary: Negative for dysuria, urgency, frequency, hematuria, flank pain, decreased urine volume, discharge, penile swelling, scrotal swelling, difficulty urinating,  genital sores and testicular pain.  Musculoskeletal: Negative for myalgias, back pain, joint swelling, arthralgias and gait problem.  Skin: Negative for color change, rash and wound.  Neurological: Negative for dizziness, tremors, seizures, syncope, facial asymmetry, speech difficulty, weakness, light-headedness, numbness and headaches.  Hematological: Negative for adenopathy. Does not bruise/bleed easily.  Psychiatric/Behavioral: Negative for suicidal ideas, hallucinations, behavioral problems, confusion,  sleep disturbance, self-injury, dysphoric mood, decreased concentration and agitation. The patient is not nervous/anxious.        Objective:   Physical Exam  Constitutional: He appears well-developed and well-nourished.  HENT:  Head: Normocephalic and atraumatic.  Right Ear: External ear normal.  Left Ear: External ear normal.  Nose: Nose normal.  Mouth/Throat: Oropharynx is clear and moist.  Eyes: Conjunctivae and EOM are normal. Pupils are equal, round, and reactive to light. No scleral icterus.  Neck: Normal range of motion. Neck supple. No JVD present. No thyromegaly present.  Cardiovascular: Regular rhythm, normal heart sounds and intact distal pulses.  Exam reveals no gallop and no friction rub.   No murmur heard. Pulmonary/Chest: Effort normal and breath sounds normal. He exhibits no tenderness.  Abdominal: Soft. Bowel sounds are normal. He exhibits no distension and no mass. There is no tenderness.  Genitourinary: Prostate normal and penis normal.  Musculoskeletal: Normal range of motion. He exhibits no edema and no tenderness.   Lymphadenopathy:    He has no cervical adenopathy.  Neurological: He is alert. He has normal reflexes. No cranial nerve deficit. Coordination normal.  Skin: Skin is warm and dry. No rash noted.  Psychiatric: He has a normal mood and affect. His behavior is normal.          Assessment & Plan:   Preventive health exam Hypertension stable Exogenous obesity. Weight loss encouraged  Medicines updated Home blood pressure monitoring encouraged Recheck 1 year  Review of Systems     Objective:   Physical Exam  Constitutional:  Blood pressure 120/82  Cardiovascular:  Decreased left dorsalis pedis pulse          Assessment & Plan:

## 2015-01-05 ENCOUNTER — Telehealth: Payer: Self-pay | Admitting: Internal Medicine

## 2015-01-05 MED ORDER — TADALAFIL 10 MG PO TABS: 10.0000 mg | ORAL_TABLET | Freq: Every day | ORAL | Status: DC | PRN

## 2015-01-05 NOTE — Telephone Encounter (Signed)
Pt notified Rx sent to pharmacy

## 2015-01-05 NOTE — Telephone Encounter (Signed)
Note for work faxed to [employer] at [phone].

## 2015-01-05 NOTE — Telephone Encounter (Signed)
Pt needs refill on cialis 10 mg send to [facility] [address]

## 2015-03-07 ENCOUNTER — Other Ambulatory Visit: Payer: Self-pay | Admitting: Internal Medicine

## 2015-04-04 DIAGNOSIS — Z0279 Encounter for issue of other medical certificate: Secondary | ICD-10-CM

## 2015-04-05 NOTE — Progress Notes (Unsigned)
   Subjective:    Patient ID: Jonathan Edwards, male    DOB: Jun 29, 1961, 54 y.o.   MRN: [id number]  HPI    Review of Systems     Objective:   Physical Exam        Assessment & Plan:

## 2015-07-10 ENCOUNTER — Ambulatory Visit (INDEPENDENT_AMBULATORY_CARE_PROVIDER_SITE_OTHER): Payer: BLUE CROSS/BLUE SHIELD | Admitting: Adult Health

## 2015-07-10 ENCOUNTER — Encounter: Payer: Self-pay | Admitting: Adult Health

## 2015-07-10 VITALS — BP 124/90 | Temp 98.2°F | Ht 73.25 in | Wt 239.4 lb

## 2015-07-10 DIAGNOSIS — R42 Dizziness and giddiness: Secondary | ICD-10-CM

## 2015-07-10 MED ORDER — TADALAFIL 10 MG PO TABS: 10.0000 mg | ORAL_TABLET | Freq: Every day | ORAL | Status: DC | PRN

## 2015-07-10 MED ORDER — LISINOPRIL-HYDROCHLOROTHIAZIDE 20-12.5 MG PO TABS: 1.0000 | ORAL_TABLET | Freq: Every day | ORAL | Status: DC

## 2015-07-10 MED ORDER — DOXYCYCLINE HYCLATE 100 MG PO CAPS: 100.0000 mg | ORAL_CAPSULE | Freq: Two times a day (BID) | ORAL | Status: DC

## 2015-07-10 MED ORDER — FELODIPINE ER 5 MG PO TB24: 5.0000 mg | ORAL_TABLET | Freq: Every day | ORAL | Status: DC

## 2015-07-10 NOTE — Patient Instructions (Signed)
It was a pleasure meeting you today!  I believe that your dizziness is related to your sinus infection.   I have sent in a prescription for Doxycycline. Take this twice a day for 7 days.   You can also use Flonase and/or normal saline spray.   Follow up if no improvement in the next 2-3 days.

## 2015-07-10 NOTE — Progress Notes (Signed)
Subjective:    Patient ID: Jonathan Edwards, male    DOB: 10/17/1960, 54 y.o.   MRN: [id number]  HPI  54 year old male who presents to the office today with the acute complaint of dizziness that he first had this morning. Per patient, he woke up and felt dizzy when he got out of bed. His wife took his blood pressure and it was 120/80's. He then took dramamine and went back to bed. When he woke up several hours later he was still dizzy but it had improved. He describes this as " the room spinning". He has not had a syncopal episode.  He endorses that he has had sinus pain/pressure and drainage for the last month. He denies having a fever but does have nausea from the post nasal drip. His pressure is located in the frontal and maxillary sinus cavities. Denies any headaches or blurred vision. He does not have any ear discomfort.    Review of Systems  Constitutional: Negative.   HENT: Positive for postnasal drip, rhinorrhea and sinus pressure. Negative for ear discharge, ear pain, sore throat, tinnitus, trouble swallowing and voice change.   Eyes: Negative.   Respiratory: Negative.   Cardiovascular: Negative.   Allergic/Immunologic: Negative.   Neurological: Positive for dizziness.  Hematological: Negative.   All other systems reviewed and are negative.  No past medical history on file.  Social History   Social History  . Marital Status: Married    Spouse Name: N/A  . Number of Children: N/A  . Years of Education: N/A   Occupational History  . Not on file.   Social History Main Topics  . Smoking status: Former Smoker    Quit date: 08/12/1998  . Smokeless tobacco: Never Used  . Alcohol Use: No  . Drug Use: No  . Sexual Activity: Not on file   Other Topics Concern  . Not on file   Social History Narrative    No past surgical history on file.  No family history on file.  No Known Allergies  Current Outpatient Prescriptions on File Prior to Visit  Medication Sig  Dispense Refill  . LUZU 1 % CREA Apply 1 application topically as needed.     . tadalafil (CIALIS) 10 MG tablet Take 1 tablet (10 mg total) by mouth daily as needed for erectile dysfunction. 10 tablet 2   No current facility-administered medications on file prior to visit.    BP 124/90 mmHg  Temp(Src) 98.2 F (36.8 C) (Oral)  Ht 6' 1.25" (1.861 m)  Wt 239 lb 6.4 oz (108.591 kg)  BMI 31.35 kg/m2       Objective:   Physical Exam  Constitutional: He is oriented to person, place, and time. He appears well-developed and well-nourished. No distress.  HENT:  Head: Normocephalic and atraumatic.  Right Ear: External ear normal.  Left Ear: External ear normal.  Nose: Nose normal.  Mouth/Throat: No oropharyngeal exudate.  TM's visualized. No signs of infection + PND  Eyes: Conjunctivae and EOM are normal. Pupils are equal, round, and reactive to light. Right eye exhibits no discharge. Left eye exhibits no discharge.  Neck: Normal range of motion. Neck supple. No thyromegaly present.  Cardiovascular: Normal rate, regular rhythm, normal heart sounds and intact distal pulses.  Exam reveals no gallop and no friction rub.   No murmur heard. Pulmonary/Chest: Effort normal and breath sounds normal. No respiratory distress. He has no wheezes. He has no rales. He exhibits no tenderness.  Lymphadenopathy:  He has no cervical adenopathy.  Neurological: He is alert and oriented to person, place, and time.  Skin: Skin is warm and dry. No rash noted. He is not diaphoretic. No erythema. No pallor.  Psychiatric: He has a normal mood and affect. His behavior is normal. Judgment and thought content normal.  Nursing note and vitals reviewed.      Assessment & Plan:  1. Dizziness - May be due to sinus infection. Cannot rule out vertigo at this point. Will try antibiotic to help clear sinus infection and see if dizziness improves. Continue to take dramamine as needed. Stay well hydrated.  -  doxycycline (VIBRAMYCIN) 100 MG capsule; Take 1 capsule (100 mg total) by mouth 2 (two) times daily.  Dispense: 14 capsule; Refill: 0 - Consider vestibular rehab if no improvement.

## 2015-07-10 NOTE — Addendum Note (Signed)
Addended by: Colleen Can on: 07/10/2015 04:59 PM   Modules accepted: Orders

## 2015-08-29 LAB — BASIC METABOLIC PANEL: GLUCOSE: 90 mg/dL

## 2015-08-29 LAB — LIPID PANEL
Cholesterol: 144 mg/dL (ref 0–200)
HDL: 37 mg/dL (ref 35–70)
LDL Cholesterol: 88 mg/dL
LDl/HDL Ratio: 3.9
Triglycerides: 97 mg/dL (ref 40–160)

## 2015-11-30 ENCOUNTER — Telehealth: Payer: Self-pay | Admitting: Internal Medicine

## 2015-11-30 MED ORDER — LISINOPRIL-HYDROCHLOROTHIAZIDE 20-12.5 MG PO TABS: 1.0000 | ORAL_TABLET | Freq: Every day | ORAL | Status: DC

## 2015-11-30 MED ORDER — TADALAFIL 10 MG PO TABS: 10.0000 mg | ORAL_TABLET | Freq: Every day | ORAL | Status: DC | PRN

## 2015-11-30 MED ORDER — FELODIPINE ER 5 MG PO TB24: 5.0000 mg | ORAL_TABLET | Freq: Every day | ORAL | Status: DC

## 2015-11-30 NOTE — Telephone Encounter (Signed)
Pt is no longer using mail order pharm. Pt needs new rxs send to [facility] [address]. Lisinopril-hctz 20-12.5 mg #90, felodipine 5 mg#90 and cialis #10 for 90 day supply w/refills

## 2015-11-30 NOTE — Telephone Encounter (Signed)
Rx's sent to [facility] [address] as requested.

## 2015-12-29 ENCOUNTER — Other Ambulatory Visit: Payer: BLUE CROSS/BLUE SHIELD

## 2016-01-05 ENCOUNTER — Ambulatory Visit (INDEPENDENT_AMBULATORY_CARE_PROVIDER_SITE_OTHER): Payer: 59 | Admitting: Internal Medicine

## 2016-01-05 ENCOUNTER — Encounter: Payer: Self-pay | Admitting: Internal Medicine

## 2016-01-05 VITALS — BP 132/90 | HR 78 | Temp 98.5°F | Ht 73.25 in | Wt 240.0 lb

## 2016-01-05 DIAGNOSIS — I10 Essential (primary) hypertension: Secondary | ICD-10-CM | POA: Diagnosis not present

## 2016-01-05 DIAGNOSIS — J3089 Other allergic rhinitis: Secondary | ICD-10-CM | POA: Diagnosis not present

## 2016-01-05 DIAGNOSIS — Z0000 Encounter for general adult medical examination without abnormal findings: Secondary | ICD-10-CM

## 2016-01-05 NOTE — Progress Notes (Signed)
Subjective:    Patient ID: Jonathan Edwards, male    DOB: 15-May-1961, 55 y.o.   MRN: [id number]  HPI    Subjective:    Patient ID: Jonathan Edwards, male    DOB: 1961/08/07, 55 y.o.   MRN: [id number]  HPI  55 year old patient who is seen today for a preventive health examination  Wt Readings from Last 3 Encounters:  01/05/16 240 lb (108.863 kg)  07/10/15 239 lb 6.4 oz (108.591 kg)  01/04/15 238 lb (107.956 kg)    Colonoscopy at age 8  Past Surgical History:  Reviewed history from 12/26/2006 and no changes required.  Tonsillectomy   Family History:  Reviewed history from 05/04/2007 and no changes required.  both parents died age 57; history of hypertension, and lymphoma  two brothers two sisters positive for hypertension   Social History:  Reviewed history from 11/23/2008 and no changes required.  Married;  55  y/o triplets  2 stepchildren  Regular exercise-[employer] 5 times per week        No past medical history on file.  Social History   Social History  . Marital Status: Married    Spouse Name: N/A  . Number of Children: N/A  . Years of Education: N/A   Occupational History  . Not on file.   Social History Main Topics  . Smoking status: Former Smoker    Quit date: 08/12/1998  . Smokeless tobacco: Never Used  . Alcohol Use: No  . Drug Use: No  . Sexual Activity: Not on file   Other Topics Concern  . Not on file   Social History Narrative    No past surgical history on file.  No family history on file.  No Known Allergies  Current Outpatient Prescriptions on File Prior to Visit  Medication Sig Dispense Refill  . doxycycline (VIBRAMYCIN) 100 MG capsule Take 1 capsule (100 mg total) by mouth 2 (two) times daily. 14 capsule 0  . felodipine (PLENDIL) 5 MG 24 hr tablet Take 1 tablet (5 mg total) by mouth daily. 90 tablet 1  . lisinopril-hydrochlorothiazide (PRINZIDE,ZESTORETIC) 20-12.5 MG tablet Take 1 tablet by mouth daily. 90 tablet 1    . tadalafil (CIALIS) 10 MG tablet Take 1 tablet (10 mg total) by mouth daily as needed for erectile dysfunction. 10 tablet 3   No current facility-administered medications on file prior to visit.    BP 132/90 mmHg  Pulse 78  Temp(Src) 98.5 F (36.9 C) (Oral)  Ht 6' 1.25" (1.861 m)  Wt 240 lb (108.863 kg)  BMI 31.43 kg/m2  SpO2 97%       Review of Systems  Constitutional: Negative for fever, chills, activity change, appetite change and fatigue.  HENT: Negative for hearing loss, ear pain, congestion, rhinorrhea, sneezing, mouth sores, trouble swallowing, neck pain, neck stiffness, dental problem, voice change, sinus pressure and tinnitus.   Eyes: Negative for photophobia, pain, redness and visual disturbance.  Respiratory: Negative for apnea, cough, choking, chest tightness, shortness of breath and wheezing.   Cardiovascular: Negative for chest pain, palpitations and leg swelling.  Gastrointestinal: Negative for nausea, vomiting, abdominal pain, diarrhea, constipation, blood in stool, abdominal distention, anal bleeding and rectal pain.  Genitourinary: Negative for dysuria, urgency, frequency, hematuria, flank pain, decreased urine volume, discharge, penile swelling, scrotal swelling, difficulty urinating, genital sores and testicular pain.  Musculoskeletal: Negative for myalgias, back pain, joint swelling, arthralgias and gait problem.  Skin: Negative for color change, rash and wound.  Neurological: Negative for dizziness,  tremors, seizures, syncope, facial asymmetry, speech difficulty, weakness, light-headedness, numbness and headaches.  Hematological: Negative for adenopathy. Does not bruise/bleed easily.  Psychiatric/Behavioral: Negative for suicidal ideas, hallucinations, behavioral problems, confusion, sleep disturbance, self-injury, dysphoric mood, decreased concentration and agitation. The patient is not nervous/anxious.        Objective:   Physical Exam  Constitutional:  He appears well-developed and well-nourished.  HENT:  Head: Normocephalic and atraumatic.  Right Ear: External ear normal.  Left Ear: External ear normal.  Nose: Nose normal.  Mouth/Throat: Oropharynx is clear and moist.  Eyes: Conjunctivae and EOM are normal. Pupils are equal, round, and reactive to light. No scleral icterus.  Neck: Normal range of motion. Neck supple. No JVD present. No thyromegaly present.  Cardiovascular: Regular rhythm, normal heart sounds and intact distal pulses.  Exam reveals no gallop and no friction rub.   No murmur heard. Pulmonary/Chest: Effort normal and breath sounds normal. He exhibits no tenderness.  Abdominal: Soft. Bowel sounds are normal. He exhibits no distension and no mass. There is no tenderness.  Genitourinary: Prostate normal and penis normal.  Musculoskeletal: Normal range of motion. He exhibits no edema and no tenderness.   Lymphadenopathy:    He has no cervical adenopathy.  Neurological: He is alert. He has normal reflexes. No cranial nerve deficit. Coordination normal.  Skin: Skin is warm and dry. No rash noted.  Psychiatric: He has a normal mood and affect. His behavior is normal.          Assessment & Plan:   Preventive health exam Hypertension stable Exogenous obesity. Weight loss encouraged  Medicines updated Home blood pressure monitoring encouraged Recheck 1 year  Review of Systems      Objective:   Physical Exam  Constitutional:  Blood pressure 120/82  Cardiovascular:  Decreased left dorsalis pedis pulse          Assessment & Plan:   Preventive health exam Hypertension, stable Mild obesity.  Weight loss encouraged  Laboratory update reviewed

## 2016-01-05 NOTE — Progress Notes (Signed)
   Subjective:    Patient ID: Jonathan Edwards, male    DOB: [birth year], 55 y.o.   MRN: [id number]  HPI    Review of Systems     Objective:   Physical Exam        Assessment & Plan:

## 2016-01-05 NOTE — Progress Notes (Signed)
Pre visit review using our clinic review tool, if applicable. No additional management support is needed unless otherwise documented below in the visit note. 

## 2016-01-05 NOTE — Patient Instructions (Signed)

## 2016-01-06 LAB — HIV ANTIBODY (ROUTINE TESTING W REFLEX): HIV: NONREACTIVE

## 2016-01-06 LAB — HEPATITIS C ANTIBODY: HCV AB: NEGATIVE

## 2016-01-09 ENCOUNTER — Encounter: Payer: Self-pay | Admitting: Internal Medicine

## 2016-01-25 ENCOUNTER — Encounter: Payer: Self-pay | Admitting: Internal Medicine

## 2016-04-02 ENCOUNTER — Ambulatory Visit (INDEPENDENT_AMBULATORY_CARE_PROVIDER_SITE_OTHER): Payer: 59 | Admitting: Family Medicine

## 2016-04-02 ENCOUNTER — Encounter: Payer: Self-pay | Admitting: Family Medicine

## 2016-04-02 VITALS — BP 102/64 | HR 72 | Temp 98.3°F | Ht 73.25 in | Wt 243.7 lb

## 2016-04-02 DIAGNOSIS — M545 Low back pain, unspecified: Secondary | ICD-10-CM

## 2016-04-02 NOTE — Patient Instructions (Signed)
Heat 15 minutes twice daily  Aleve as needed per instruction for pain  Muscle relaxer as needed per instructions  Please do gentle stretching and activity as we discussed  I hope you feel better soon!  Follow up if worsening, new symptoms or symptoms persist in 2-3 weeks

## 2016-04-02 NOTE — Progress Notes (Signed)
  HPI:  Acute visit for:  Back pain: -Went swimming over the weekend, a new activity for him, and has developed some "muscle soreness" in the right mid back, mod sharp pain with certain movements -Pain is worse with certain movements -No pain at rest -Better with muscle relaxer, avoiding aggravating movements and Aleve -Feel somewhat better today, but wife made him come in to get checked out -Denies: Fevers, malaise, urinary symptoms, radiation of pain, numbness or weakness, bowel or bladder dysfunction   ROS:  See pertinent positives and negatives per HPI.  No past medical history on file.  No past surgical history on file.  No family history on file.  Social History   Social History  . Marital status: Married    Spouse name: N/A  . Number of children: N/A  . Years of education: N/A   Social History Main Topics  . Smoking status: Former Smoker    Quit date: 08/12/1998  . Smokeless tobacco: Never Used  . Alcohol use No  . Drug use: No  . Sexual activity: Not Asked   Other Topics Concern  . None   Social History Narrative  . None     Current Outpatient Prescriptions:  .  cyclobenzaprine (FLEXERIL) 5 MG tablet, Take 5 mg by mouth 3 (three) times daily as needed for muscle spasms., Disp: , Rfl:  .  felodipine (PLENDIL) 5 MG 24 hr tablet, Take 1 tablet (5 mg total) by mouth daily., Disp: 90 tablet, Rfl: 1 .  lisinopril-hydrochlorothiazide (PRINZIDE,ZESTORETIC) 20-12.5 MG tablet, Take 1 tablet by mouth daily., Disp: 90 tablet, Rfl: 1 .  tadalafil (CIALIS) 10 MG tablet, Take 1 tablet (10 mg total) by mouth daily as needed for erectile dysfunction., Disp: 10 tablet, Rfl: 3  EXAM:  Vitals:   04/02/16 1605  BP: 102/64  Pulse: 72  Temp: 98.3 F (36.8 C)    Body mass index is 31.93 kg/m.  GENERAL: vitals reviewed and listed above, alert, oriented, appears well hydrated and in no acute distress  HEENT: atraumatic, conjunttiva clear, no obvious abnormalities on  inspection of external nose and ears  NECK: no obvious masses on inspection  MS: moves all extremities without noticeable abnormality; normal inspection of back, TTP in the muscle belly of the lat dors muscle on the R, otherwise normal exam  PSYCH: pleasant and cooperative, no obvious depression or anxiety  ASSESSMENT AND PLAN:  Discussed the following assessment and plan:  Right-sided low back pain without sciatica  -we discussed possible serious and likely etiologies, workup and treatment, treatment risks and return precautions -suspect muscle strain from swimming -opted for symptomatic care, he has muscle relaxer (flexeril 5mg ) at home, stretching, heat and follow up if worsens or persists -Patient advised to return or notify a doctor immediately if symptoms worsen or persist or new concerns arise.  Patient Instructions  Heat 15 minutes twice daily  Aleve as needed per instruction for pain  Muscle relaxer as needed per instructions  Please do gentle stretching and activity as we discussed  I hope you feel better soon!  Follow up if worsening, new symptoms or symptoms persist in 2-3 weeks   Colin Benton R., DO

## 2016-04-02 NOTE — Progress Notes (Signed)
Pre visit review using our clinic review tool, if applicable. No additional management support is needed unless otherwise documented below in the visit note. 

## 2016-04-22 ENCOUNTER — Encounter: Payer: Self-pay | Admitting: Internal Medicine

## 2016-04-22 ENCOUNTER — Ambulatory Visit (INDEPENDENT_AMBULATORY_CARE_PROVIDER_SITE_OTHER): Payer: 59 | Admitting: Internal Medicine

## 2016-04-22 VITALS — BP 124/80 | HR 68 | Temp 98.1°F | Resp 20 | Ht 73.25 in | Wt 245.0 lb

## 2016-04-22 DIAGNOSIS — I10 Essential (primary) hypertension: Secondary | ICD-10-CM

## 2016-04-22 DIAGNOSIS — M545 Low back pain, unspecified: Secondary | ICD-10-CM

## 2016-04-22 MED ORDER — TADALAFIL 10 MG PO TABS: 10.0000 mg | ORAL_TABLET | Freq: Every day | ORAL | 3 refills | Status: DC | PRN

## 2016-04-22 NOTE — Progress Notes (Signed)
   Subjective:    Patient ID: Jonathan Edwards, male    DOB: 1960-09-03, 55 y.o.   MRN: [id number]  HPI  55 year old patient who has essential hypertension.  He presents today for follow-up of low back pain.  This continues to improve, but he still has some minor discomfort.  This seems worse at night after prolonged inactivity.  No radicular symptoms.  No constitutional complaints No prior history of chronic low back pain  No past medical history on file.   Social History   Social History  . Marital status: Married    Spouse name: N/A  . Number of children: N/A  . Years of education: N/A   Occupational History  . Not on file.   Social History Main Topics  . Smoking status: Former Smoker    Quit date: 08/12/1998  . Smokeless tobacco: Never Used  . Alcohol use No  . Drug use: No  . Sexual activity: Not on file   Other Topics Concern  . Not on file   Social History Narrative  . No narrative on file    No past surgical history on file.  No family history on file.  Allergies  Allergen Reactions  . Cyclobenzaprine Other (See Comments)    Constipation    Current Outpatient Prescriptions on File Prior to Visit  Medication Sig Dispense Refill  . felodipine (PLENDIL) 5 MG 24 hr tablet Take 1 tablet (5 mg total) by mouth daily. 90 tablet 1  . lisinopril-hydrochlorothiazide (PRINZIDE,ZESTORETIC) 20-12.5 MG tablet Take 1 tablet by mouth daily. 90 tablet 1   No current facility-administered medications on file prior to visit.     BP 124/80 (BP Location: Left Arm, Patient Position: Sitting, Cuff Size: Large)   Pulse 68   Temp 98.1 F (36.7 C) (Oral)   Resp 20   Ht 6' 1.25" (1.861 m)   Wt 245 lb (111.1 kg)   SpO2 98%   BMI 32.10 kg/m     Review of Systems  Constitutional: Negative for appetite change, chills, fatigue and fever.  HENT: Negative for congestion, dental problem, ear pain, hearing loss, sore throat, tinnitus, trouble swallowing and voice change.   Eyes:  Negative for pain, discharge and visual disturbance.  Respiratory: Negative for cough, chest tightness, wheezing and stridor.   Cardiovascular: Negative for chest pain, palpitations and leg swelling.  Gastrointestinal: Negative for abdominal distention, abdominal pain, blood in stool, constipation, diarrhea, nausea and vomiting.  Genitourinary: Negative for difficulty urinating, discharge, flank pain, genital sores, hematuria and urgency.  Musculoskeletal: Positive for back pain. Negative for arthralgias, gait problem, joint swelling, myalgias and neck stiffness.  Skin: Negative for rash.  Neurological: Negative for dizziness, syncope, speech difficulty, weakness, numbness and headaches.  Hematological: Negative for adenopathy. Does not bruise/bleed easily.  Psychiatric/Behavioral: Negative for behavioral problems and dysphoric mood. The patient is not nervous/anxious.        Objective:   Physical Exam  Constitutional: He appears well-developed and well-nourished. No distress.  Musculoskeletal:  Negative straight leg test Full range of motion of both hips          Assessment & Plan:   Lumbosacral strain.  Will continue gentle stretching, heat therapy.  Naproxen twice daily recommended.  Patient is improving.  Will call if he develops new worsening or refractory symptoms  Nyoka Cowden

## 2016-04-22 NOTE — Patient Instructions (Addendum)
Most patients with low back pain will improve with time over the next two to 6 weeks.  Keep active but avoid any activities that cause pain.  Apply moist heat to the low back area several times daily.  Take 400-600 mg of ibuprofen ( Advil, Motrin) with food every 4 to 6 hours as needed for pain relief    Lumbosacral Strain Lumbosacral strain is a strain of any of the parts that make up your lumbosacral vertebrae. Your lumbosacral vertebrae are the bones that make up the lower third of your backbone. Your lumbosacral vertebrae are held together by muscles and tough, fibrous tissue (ligaments).  CAUSES  A sudden blow to your back can cause lumbosacral strain. Also, anything that causes an excessive stretch of the muscles in the low back can cause this strain. This is typically seen when people exert themselves strenuously, fall, lift heavy objects, bend, or crouch repeatedly. RISK FACTORS  Physically demanding work.  Participation in pushing or pulling sports or sports that require a sudden twist of the back (tennis, golf, baseball).  Weight lifting.  Excessive lower back curvature.  Forward-tilted pelvis.  Weak back or abdominal muscles or both.  Tight hamstrings. SIGNS AND SYMPTOMS  Lumbosacral strain may cause pain in the area of your injury or pain that moves (radiates) down your leg.  DIAGNOSIS Your health care provider can often diagnose lumbosacral strain through a physical exam. In some cases, you may need tests such as X-ray exams.  TREATMENT  Treatment for your lower back injury depends on many factors that your clinician will have to evaluate. However, most treatment will include the use of anti-inflammatory medicines. HOME CARE INSTRUCTIONS   Avoid hard physical activities (tennis, racquetball, waterskiing) if you are not in proper physical condition for it. This may aggravate or create problems.  If you have a back problem, avoid sports requiring sudden body movements.  Swimming and walking are generally safer activities.  Maintain good posture.  Maintain a healthy weight.  For acute conditions, you may put ice on the injured area.  Put ice in a plastic bag.  Place a towel between your skin and the bag.  Leave the ice on for 20 minutes, 2-3 times a day.  When the low back starts healing, stretching and strengthening exercises may be recommended. SEEK MEDICAL CARE IF:  Your back pain is getting worse.  You experience severe back pain not relieved with medicines. SEEK IMMEDIATE MEDICAL CARE IF:   You have numbness, tingling, weakness, or problems with the use of your arms or legs.  There is a change in bowel or bladder control.  You have increasing pain in any area of the body, including your belly (abdomen).  You notice shortness of breath, dizziness, or feel faint.  You feel sick to your stomach (nauseous), are throwing up (vomiting), or become sweaty.  You notice discoloration of your toes or legs, or your feet get very cold. MAKE SURE YOU:   Understand these instructions.  Will watch your condition.  Will get help right away if you are not doing well or get worse.   This information is not intended to replace advice given to you by your health care provider. Make sure you discuss any questions you have with your health care provider.   Document Released: 05/08/2005 Document Revised: 08/19/2014 Document Reviewed: 03/17/2013 Elsevier Interactive Patient Education [employer].

## 2016-04-22 NOTE — Progress Notes (Signed)
Pre visit review using our clinic review tool, if applicable. No additional management support is needed unless otherwise documented below in the visit note. 

## 2016-06-20 ENCOUNTER — Other Ambulatory Visit: Payer: Self-pay | Admitting: Internal Medicine

## 2016-08-19 DIAGNOSIS — Z719 Counseling, unspecified: Secondary | ICD-10-CM | POA: Diagnosis not present

## 2016-12-09 ENCOUNTER — Encounter: Payer: Self-pay | Admitting: Internal Medicine

## 2016-12-09 ENCOUNTER — Ambulatory Visit (INDEPENDENT_AMBULATORY_CARE_PROVIDER_SITE_OTHER): Payer: 59 | Admitting: Internal Medicine

## 2016-12-09 VITALS — BP 122/68 | HR 91 | Temp 98.7°F | Ht 73.25 in | Wt 242.4 lb

## 2016-12-09 DIAGNOSIS — I10 Essential (primary) hypertension: Secondary | ICD-10-CM

## 2016-12-09 DIAGNOSIS — J301 Allergic rhinitis due to pollen: Secondary | ICD-10-CM

## 2016-12-09 MED ORDER — FELODIPINE ER 5 MG PO TB24: 5.0000 mg | ORAL_TABLET | Freq: Every day | ORAL | 1 refills | Status: DC

## 2016-12-09 MED ORDER — LISINOPRIL-HYDROCHLOROTHIAZIDE 20-12.5 MG PO TABS: 1.0000 | ORAL_TABLET | Freq: Every day | ORAL | 1 refills | Status: DC

## 2016-12-09 NOTE — Patient Instructions (Addendum)
WE NOW OFFER    [facility] FAST TRACK!!!  SAME DAY Appointments for ACUTE CARE  Such as: Sprains, Injuries, cuts, abrasions, rashes, muscle pain, joint pain, back pain Colds, flu, sore throats, headache, allergies, cough, fever  Ear pain, sinus and eye infections Abdominal pain, nausea, vomiting, diarrhea, upset stomach Animal/insect bites  3 Easy Ways to Schedule: Walk-In Scheduling Call in scheduling Mychart Sign-up: [email or web address]   Limit your sodium (Salt) intake  Please check your blood pressure on a regular basis.  If it is consistently greater than 150/90, please make an office appointment.    It is important that you exercise regularly, at least 20 minutes 3 to 4 times per week.  If you develop chest pain or shortness of breath seek  medical attention.  You need to lose weight.  Consider a lower calorie diet and regular exercise.  Return in 6 months for follow-up

## 2016-12-09 NOTE — Progress Notes (Signed)
Pre visit review using our clinic review tool, if applicable. No additional management support is needed unless otherwise documented below in the visit note. 

## 2016-12-09 NOTE — Progress Notes (Signed)
   Subjective:    Patient ID: Jonathan Edwards, male    DOB: Nov 06, 1960, 56 y.o.   MRN: [id number]  HPI  56 year old patient who has essential hypertension.  He has had some right knee pain that has responded to Tylenol and ibuprofen. He generally is doing well He is scheduled to see sports medicine in a couple weeks.  No past medical history on file.   Social History   Social History  . Marital status: Married    Spouse name: N/A  . Number of children: N/A  . Years of education: N/A   Occupational History  . Not on file.   Social History Main Topics  . Smoking status: Former Smoker    Quit date: 08/12/1998  . Smokeless tobacco: Never Used  . Alcohol use No  . Drug use: No  . Sexual activity: Not on file   Other Topics Concern  . Not on file   Social History Narrative  . No narrative on file    No past surgical history on file.  No family history on file.  Allergies  Allergen Reactions  . Cyclobenzaprine Other (See Comments)    Constipation    Current Outpatient Prescriptions on File Prior to Visit  Medication Sig Dispense Refill  . tadalafil (CIALIS) 10 MG tablet Take 1 tablet (10 mg total) by mouth daily as needed for erectile dysfunction. 30 tablet 3   No current facility-administered medications on file prior to visit.     BP 122/68 (BP Location: Left Arm, Patient Position: Sitting, Cuff Size: Normal)   Pulse 91   Temp 98.7 F (37.1 C) (Oral)   Ht 6' 1.25" (1.861 m)   Wt 242 lb 6.4 oz (110 kg)   SpO2 98%   BMI 31.76 kg/m      Review of Systems  Constitutional: Negative for appetite change, chills, fatigue and fever.  HENT: Negative for congestion, dental problem, ear pain, hearing loss, sore throat, tinnitus, trouble swallowing and voice change.   Eyes: Negative for pain, discharge and visual disturbance.  Respiratory: Negative for cough, chest tightness, wheezing and stridor.   Cardiovascular: Negative for chest pain, palpitations and leg  swelling.  Gastrointestinal: Negative for abdominal distention, abdominal pain, blood in stool, constipation, diarrhea, nausea and vomiting.  Genitourinary: Negative for difficulty urinating, discharge, flank pain, genital sores, hematuria and urgency.  Musculoskeletal: Positive for arthralgias. Negative for back pain, gait problem, joint swelling, myalgias and neck stiffness.       Improving right knee pain for one week  Skin: Negative for rash.  Neurological: Negative for dizziness, syncope, speech difficulty, weakness, numbness and headaches.  Hematological: Negative for adenopathy. Does not bruise/bleed easily.  Psychiatric/Behavioral: Negative for behavioral problems and dysphoric mood. The patient is not nervous/anxious.        Objective:   Physical Exam  Constitutional: He appears well-developed and well-nourished. No distress.  Musculoskeletal:  No effusion or active inflammatory changes          Assessment & Plan:   Improving right knee pain.  Probable strain Essential hypertension, stable  Sports medicine.  Follow-up if needed  CPX as scheduled  Nyoka Cowden

## 2016-12-24 ENCOUNTER — Other Ambulatory Visit: Payer: Self-pay | Admitting: Internal Medicine

## 2016-12-25 ENCOUNTER — Ambulatory Visit: Payer: 59 | Admitting: Family Medicine

## 2016-12-31 ENCOUNTER — Emergency Department (HOSPITAL_BASED_OUTPATIENT_CLINIC_OR_DEPARTMENT_OTHER): Payer: 59

## 2016-12-31 ENCOUNTER — Emergency Department (HOSPITAL_BASED_OUTPATIENT_CLINIC_OR_DEPARTMENT_OTHER)
Admission: EM | Admit: 2016-12-31 | Discharge: 2016-12-31 | Disposition: A | Discharge status: Home / Self Care | Payer: 59 | Attending: Emergency Medicine | Admitting: Emergency Medicine

## 2016-12-31 DIAGNOSIS — S8991XA Unspecified injury of right lower leg, initial encounter: Secondary | ICD-10-CM | POA: Diagnosis present

## 2016-12-31 DIAGNOSIS — Z87891 Personal history of nicotine dependence: Secondary | ICD-10-CM | POA: Insufficient documentation

## 2016-12-31 DIAGNOSIS — Z79899 Other long term (current) drug therapy: Secondary | ICD-10-CM | POA: Diagnosis not present

## 2016-12-31 DIAGNOSIS — I10 Essential (primary) hypertension: Secondary | ICD-10-CM | POA: Insufficient documentation

## 2016-12-31 DIAGNOSIS — M25561 Pain in right knee: Secondary | ICD-10-CM | POA: Diagnosis not present

## 2016-12-31 DIAGNOSIS — R52 Pain, unspecified: Secondary | ICD-10-CM

## 2016-12-31 DIAGNOSIS — S838X1A Sprain of other specified parts of right knee, initial encounter: Secondary | ICD-10-CM | POA: Diagnosis not present

## 2016-12-31 DIAGNOSIS — Y939 Activity, unspecified: Secondary | ICD-10-CM | POA: Diagnosis not present

## 2016-12-31 DIAGNOSIS — S83401A Sprain of unspecified collateral ligament of right knee, initial encounter: Secondary | ICD-10-CM | POA: Diagnosis not present

## 2016-12-31 DIAGNOSIS — X58XXXA Exposure to other specified factors, initial encounter: Secondary | ICD-10-CM | POA: Insufficient documentation

## 2016-12-31 DIAGNOSIS — Y999 Unspecified external cause status: Secondary | ICD-10-CM | POA: Diagnosis not present

## 2016-12-31 DIAGNOSIS — Y929 Unspecified place or not applicable: Secondary | ICD-10-CM | POA: Diagnosis not present

## 2016-12-31 MED ORDER — IBUPROFEN 800 MG PO TABS
800.0000 mg | ORAL_TABLET | Freq: Once | ORAL | Status: AC
  Administered 2016-12-31: 800 mg via ORAL
  Filled 2016-12-31: qty 1

## 2016-12-31 MED ORDER — IBUPROFEN 600 MG PO TABS: 600.0000 mg | ORAL_TABLET | Freq: Four times a day (QID) | ORAL | 0 refills | Status: DC | PRN

## 2016-12-31 NOTE — ED Provider Notes (Signed)
[facility]PT [facility] Provider Note   CSN: [id number] Arrival date & time: 12/31/16  2116  By signing my name below, I, Mayer Masker, attest that this documentation has been prepared under the direction and in the presence of Horton, Barbette Hair, MD. Electronically Signed: Mayer Masker, Scribe. 12/31/16. 11:37 PM.  History   Chief Complaint Chief Complaint  Patient presents with  . Knee Pain   The history is provided by the patient. No language interpreter was used.  HPI Comments: Jonathan Edwards is a 56 y.o. male who presents to the Emergency Department complaining of constant knee pain since 2 months. He states tonight he stepped funny And had increasing pain. Current pain is 7 out of 10. Worse with weightbearing. Denies any numbness or tingling. He states sometimes he takes ibuprofen with relief. He has been able to weight-bear. Denies other injury.  No past medical history on file.  Patient Active Problem List   Diagnosis Date Noted  . Midline low back pain without sciatica 04/22/2016  . Acute bronchitis 10/03/2014  . Plantar fasciitis of right foot 09/27/2014  . Subacromial bursitis 08/16/2014  . HERNIA, UMBILICAL 94/85/4627  . PROSTATITIS, MILD 05/17/2008  . Essential hypertension 12/26/2006  . Allergic rhinitis 12/26/2006    No past surgical history on file.     Home Medications    Prior to Admission medications   Medication Sig Start Date End Date Taking? Authorizing Provider  felodipine (PLENDIL) 5 MG 24 hr tablet Take 1 tablet (5 mg total) by mouth daily. 12/09/16   Marletta Lor, MD  felodipine (PLENDIL) 5 MG 24 hr tablet TAKE 1 TABLET (5 MG TOTAL) BY MOUTH DAILY. 12/24/16   Marletta Lor, MD  ibuprofen (ADVIL,MOTRIN) 600 MG tablet Take 1 tablet (600 mg total) by mouth every 6 (six) hours as needed. 12/31/16   Horton, Barbette Hair, MD  lisinopril-hydrochlorothiazide (PRINZIDE,ZESTORETIC) 20-12.5 MG tablet Take 1 tablet by mouth daily. 12/09/16    Marletta Lor, MD  lisinopril-hydrochlorothiazide (PRINZIDE,ZESTORETIC) 20-12.5 MG tablet TAKE 1 TABLET BY MOUTH DAILY. 12/24/16   Marletta Lor, MD  tadalafil (CIALIS) 10 MG tablet Take 1 tablet (10 mg total) by mouth daily as needed for erectile dysfunction. 04/22/16   Marletta Lor, MD    Family History No family history on file.  Social History Social History  Substance Use Topics  . Smoking status: Former Smoker    Quit date: 08/12/1998  . Smokeless tobacco: Never Used  . Alcohol use No     Allergies   Patient has no active allergies.   Review of Systems Review of Systems  Constitutional: Negative for fever.  Musculoskeletal:       Right knee pain  Neurological: Negative for weakness and numbness.  All other systems reviewed and are negative.    Physical Exam Updated Vital Signs BP (!) 139/92 (BP Location: Right Arm)   Pulse 75   Temp 98.3 F (36.8 C) (Oral)   Resp 18   Ht 6' 1.5" (1.867 m)   Wt 108.4 kg (239 lb)   SpO2 98%   BMI 31.10 kg/m   Physical Exam  Constitutional: He is oriented to person, place, and time. He appears well-developed and well-nourished. No distress.  HENT:  Head: Normocephalic and atraumatic.  Cardiovascular: Normal rate and regular rhythm.   Pulmonary/Chest: Effort normal. No respiratory distress.  Musculoskeletal:  Pain with range of motion the right knee, no significant effusion, tenderness palpation over the lateral joint line, no overlying skin  changes, warmth, or erythema  Neurological: He is alert and oriented to person, place, and time.  Skin: Skin is warm and dry.  Psychiatric: He has a normal mood and affect.  Nursing note and vitals reviewed.    ED Treatments / Results  DIAGNOSTIC STUDIES: Oxygen Saturation is 98% on RA, normal by my interpretation.    COORDINATION OF CARE: 11:11 PM Discussed treatment plan with pt at bedside and pt agreed to plan. Labs (all labs ordered are listed, but only  abnormal results are displayed) Labs Reviewed - No data to display  EKG  EKG Interpretation None       Radiology Dg Knee Complete 4 Views Right  Result Date: 12/31/2016 CLINICAL DATA:  Lateral knee pain for 2 months EXAM: RIGHT KNEE - COMPLETE 4+ VIEW COMPARISON:  None. FINDINGS: No acute fracture or malalignment. Os or old injury adjacent to the inferior pole of patella. Trace suprapatellar effusion. Mild narrowing of the medial and lateral joint space compartments with minimal spurring. IMPRESSION: Minimal degenerative changes.  No acute osseous abnormality. Electronically Signed   By: Donavan Foil M.D.   On: 12/31/2016 22:11    Procedures Procedures (including critical care time)  Medications Ordered in ED Medications  ibuprofen (ADVIL,MOTRIN) tablet 800 mg (800 mg Oral Given 12/31/16 2222)     Initial Impression / Assessment and Plan / ED Course  I have reviewed the triage vital signs and the nursing notes.  Pertinent labs & imaging results that were available during my care of the patient were reviewed by me and considered in my medical decision making (see chart for details).     Patient presents with acute on chronic right knee pain. Worsened with stepping today. He may have sprained his knee or has a meniscus injury. X-rays negative for fracture. Continue ibuprofen as needed at home for pain. Ice and elevate. He was provided with a knee immobilizer. Follow-up with sports medicine in 2-3 weeks if not improved.  After history, exam, and medical workup I feel the patient has been appropriately medically screened and is safe for discharge home. Pertinent diagnoses were discussed with the patient. Patient was given return precautions.   Final Clinical Impressions(s) / ED Diagnoses   Final diagnoses:  Sprain of other ligament of right knee, initial encounter    New Prescriptions New Prescriptions   IBUPROFEN (ADVIL,MOTRIN) 600 MG TABLET    Take 1 tablet (600 mg total)  by mouth every 6 (six) hours as needed.   I personally performed the services described in this documentation, which was scribed in my presence. The recorded information has been reviewed and is accurate.     Merryl Hacker, MD 01/01/17 [phone]

## 2016-12-31 NOTE — ED Notes (Signed)
Pt verbalizes understanding of d/c instructions and denies any further needs at this time. 

## 2016-12-31 NOTE — Discharge Instructions (Signed)
You likely have a ligamentous sprain of your knee. Continue to ice and elevate. Ibuprofen as needed for pain. Follow-up with sports medicine. You may need an MRI.

## 2016-12-31 NOTE — ED Triage Notes (Signed)
Pt reports pain in left knee x several months. Pt states pain worsened today after taking a mis-step.

## 2017-01-03 ENCOUNTER — Ambulatory Visit (INDEPENDENT_AMBULATORY_CARE_PROVIDER_SITE_OTHER): Payer: 59 | Admitting: Family Medicine

## 2017-01-03 ENCOUNTER — Encounter: Payer: Self-pay | Admitting: Family Medicine

## 2017-01-03 DIAGNOSIS — M25561 Pain in right knee: Secondary | ICD-10-CM | POA: Diagnosis not present

## 2017-01-03 NOTE — Patient Instructions (Addendum)
You have a lateral meniscus tear. Ice the knee 15 minutes at a time 3-4 times a day. Aleve 2 tabs twice a day with food for pain and inflammation - typically take for 7-10 days then as needed. Knee brace when up and walking around for support as you have been. Start physical therapy and do home exercises on days you don't go to therapy. Consider a cortisone injection if pain is severe. Avoid deep squats, lunges, twisting maneuvers as much as possible. Follow up with me in 6 weeks.   If not improving would consider an MRI.

## 2017-01-07 ENCOUNTER — Other Ambulatory Visit: Payer: 59

## 2017-01-07 DIAGNOSIS — M25561 Pain in right knee: Secondary | ICD-10-CM | POA: Insufficient documentation

## 2017-01-07 NOTE — Progress Notes (Signed)
PCP: Marletta Lor, MD  Subjective:   HPI: Patient is a 56 y.o. male here for right knee pain.  Patient reports he's had about 2 months of anterior, lateral right knee pain. Sits a lot and notices more after this. Pain is 1/10 at rest, up to 4/10 at worst and sharp. Also bothers with prolonged standing and lifting his leg. No skin changes, numbness.  No past medical history on file.  Current Outpatient Prescriptions on File Prior to Visit  Medication Sig Dispense Refill  . felodipine (PLENDIL) 5 MG 24 hr tablet TAKE 1 TABLET (5 MG TOTAL) BY MOUTH DAILY. 90 tablet 1  . ibuprofen (ADVIL,MOTRIN) 600 MG tablet Take 1 tablet (600 mg total) by mouth every 6 (six) hours as needed. 30 tablet 0  . lisinopril-hydrochlorothiazide (PRINZIDE,ZESTORETIC) 20-12.5 MG tablet TAKE 1 TABLET BY MOUTH DAILY. 90 tablet 1  . tadalafil (CIALIS) 10 MG tablet Take 1 tablet (10 mg total) by mouth daily as needed for erectile dysfunction. 30 tablet 3   No current facility-administered medications on file prior to visit.     No past surgical history on file.  No Known Allergies  Social History   Social History  . Marital status: Married    Spouse name: N/A  . Number of children: N/A  . Years of education: N/A   Occupational History  . Not on file.   Social History Main Topics  . Smoking status: Former Smoker    Quit date: 08/12/1998  . Smokeless tobacco: Never Used  . Alcohol use No  . Drug use: No  . Sexual activity: Not on file   Other Topics Concern  . Not on file   Social History Narrative  . No narrative on file    No family history on file.  BP 126/84   Pulse 81   Ht 6\' 1"  (1.854 m)   Wt 239 lb (108.4 kg)   BMI 31.53 kg/m   Review of Systems: See HPI above.     Objective:  Physical Exam:  Gen: NAD, comfortable in exam room  Right knee: No gross deformity, ecchymoses, effusion. TTP lateral joint line. FROM. Negative ant/post drawers. Negative valgus/varus  testing. Negative lachmanns. Positive mcmurrays, apleys, thessalys.  Negative patellar apprehension. NV intact distally.  Left knee: FROM without pain.   Assessment & Plan:  1. Right knee pain - independently reviewed radiographs and no evidence abnormalities.  Exam consistent with lateral meniscus tear.  He will start with conservative treatment - physical therapy, home exercises, aleve, knee brace for support.  Consider injection if pain worsens.  MRI if not improving.  F/u in 6 weeks.

## 2017-01-07 NOTE — Assessment & Plan Note (Signed)
independently reviewed radiographs and no evidence abnormalities.  Exam consistent with lateral meniscus tear.  He will start with conservative treatment - physical therapy, home exercises, aleve, knee brace for support.  Consider injection if pain worsens.  MRI if not improving.  F/u in 6 weeks.

## 2017-01-10 ENCOUNTER — Encounter: Payer: 59 | Admitting: Internal Medicine

## 2017-01-11 ENCOUNTER — Encounter (HOSPITAL_BASED_OUTPATIENT_CLINIC_OR_DEPARTMENT_OTHER): Payer: Self-pay | Admitting: Emergency Medicine

## 2017-01-11 ENCOUNTER — Emergency Department (HOSPITAL_BASED_OUTPATIENT_CLINIC_OR_DEPARTMENT_OTHER)
Admission: EM | Admit: 2017-01-11 | Discharge: 2017-01-11 | Disposition: A | Discharge status: Home / Self Care | Payer: BLUE CROSS/BLUE SHIELD | Attending: Emergency Medicine | Admitting: Emergency Medicine

## 2017-01-11 DIAGNOSIS — T161XXA Foreign body in right ear, initial encounter: Secondary | ICD-10-CM | POA: Diagnosis not present

## 2017-01-11 DIAGNOSIS — Y939 Activity, unspecified: Secondary | ICD-10-CM | POA: Diagnosis not present

## 2017-01-11 DIAGNOSIS — Z87891 Personal history of nicotine dependence: Secondary | ICD-10-CM | POA: Insufficient documentation

## 2017-01-11 DIAGNOSIS — Y929 Unspecified place or not applicable: Secondary | ICD-10-CM | POA: Insufficient documentation

## 2017-01-11 DIAGNOSIS — I10 Essential (primary) hypertension: Secondary | ICD-10-CM | POA: Diagnosis not present

## 2017-01-11 DIAGNOSIS — Y998 Other external cause status: Secondary | ICD-10-CM | POA: Insufficient documentation

## 2017-01-11 DIAGNOSIS — Z79899 Other long term (current) drug therapy: Secondary | ICD-10-CM | POA: Insufficient documentation

## 2017-01-11 DIAGNOSIS — X58XXXA Exposure to other specified factors, initial encounter: Secondary | ICD-10-CM | POA: Insufficient documentation

## 2017-01-11 NOTE — ED Provider Notes (Signed)
[facility] Provider Note   CSN: [id number] Arrival date & time: 01/11/17  2146   By signing my name below, I, Eunice Blase, attest that this documentation has been prepared under the direction and in the presence of Eliezer Mccoy, PA-C. Electronically Signed: Eunice Blase, Scribe. 01/11/17. 10:28 PM.   History   Chief Complaint Chief Complaint  Patient presents with  . Foreign Body in [place]   The history is provided by the patient and medical records. No language interpreter was used.    Jonathan Edwards is a 56 y.o. male presenting to the Emergency Department with chief complaint of foreign body sensation to the R ear onset 30 minutes PTA. He states a bug flew in his ear and he has been unable to remove it; he adds he has heard the insect in his ear intermittently since this. Wife allegedly placed hydrogen peroxide in the ear with minimal relief; the insect's movement has decreased since this. Pt c/o ear pain to the affected. No other complaints at this time.   History reviewed. No pertinent past medical history.  Patient Active Problem List   Diagnosis Date Noted  . Right knee pain 01/07/2017  . Midline low back pain without sciatica 04/22/2016  . Acute bronchitis 10/03/2014  . Plantar fasciitis of right foot 09/27/2014  . Subacromial bursitis 08/16/2014  . HERNIA, UMBILICAL 48/54/6270  . PROSTATITIS, MILD 05/17/2008  . Essential hypertension 12/26/2006  . Allergic rhinitis 12/26/2006    History reviewed. No pertinent surgical history.     Home Medications    Prior to Admission medications   Medication Sig Start Date End Date Taking? Authorizing Provider  felodipine (PLENDIL) 5 MG 24 hr tablet TAKE 1 TABLET (5 MG TOTAL) BY MOUTH DAILY. 12/24/16   Marletta Lor, MD  ibuprofen (ADVIL,MOTRIN) 600 MG tablet Take 1 tablet (600 mg total) by mouth every 6 (six) hours as needed. 12/31/16   Horton, Barbette Hair, MD  lisinopril-hydrochlorothiazide  (PRINZIDE,ZESTORETIC) 20-12.5 MG tablet TAKE 1 TABLET BY MOUTH DAILY. 12/24/16   Marletta Lor, MD  tadalafil (CIALIS) 10 MG tablet Take 1 tablet (10 mg total) by mouth daily as needed for erectile dysfunction. 04/22/16   Marletta Lor, MD    Family History No family history on file.  Social History Social History  Substance Use Topics  . Smoking status: Former Smoker    Quit date: 08/12/1998  . Smokeless tobacco: Never Used  . Alcohol use No     Allergies   Patient has no known allergies.   Review of Systems Review of Systems  HENT: Positive for ear pain. Negative for ear discharge.   All other systems reviewed and are negative.    Physical Exam Updated Vital Signs BP (!) 145/106 (BP Location: Left Arm)   Pulse 80   Temp 98.7 F (37.1 C) (Oral)   Resp 18   SpO2 100%   Physical Exam  Constitutional: He appears well-developed and well-nourished. No distress.  HENT:  Head: Normocephalic and atraumatic.  Mouth/Throat: Oropharynx is clear and moist. No oropharyngeal exudate.  Initially, insect seen in R canal  After removing insect, small amount of blood at 6:00 in front of TM, no other erythema or injury noted  Eyes: Conjunctivae are normal. Pupils are equal, round, and reactive to light. Right eye exhibits no discharge. Left eye exhibits no discharge. No scleral icterus.  Neck: Normal range of motion. Neck supple. No thyromegaly present.  Cardiovascular: Normal rate, regular rhythm, normal heart sounds  and intact distal pulses.  Exam reveals no gallop and no friction rub.   No murmur heard. Pulmonary/Chest: Effort normal and breath sounds normal. No stridor. No respiratory distress. He has no wheezes. He has no rales.  Musculoskeletal: He exhibits no edema.  Lymphadenopathy:    He has no cervical adenopathy.  Neurological: He is alert. Coordination normal.  Skin: Skin is warm and dry. No rash noted. He is not diaphoretic. No pallor.  Psychiatric: He has  a normal mood and affect.  Nursing note and vitals reviewed.    ED Treatments / Results  DIAGNOSTIC STUDIES: Oxygen Saturation is 100% on RA, NL by my interpretation.    COORDINATION OF CARE: 10:23 PM-Discussed next steps with pt. Pt verbalized understanding and is agreeable with the plan. Will order medications.   Labs (all labs ordered are listed, but only abnormal results are displayed) Labs Reviewed - No data to display  EKG  EKG Interpretation None       Radiology No results found.  Procedures .Foreign Body Removal Date/Time: 01/11/2017 10:19 PM Performed by: Frederica Kuster Authorized by: Frederica Kuster  Consent: Verbal consent obtained. Written consent not obtained. Risks and benefits: risks, benefits and alternatives were discussed Consent given by: patient Patient understanding: patient states understanding of the procedure being performed Patient identity confirmed: verbally with patient Body area: ear Localization method: ENT speculum Removal mechanism: alligator forceps and suction Complexity: simple 1 objects recovered. Objects recovered: Flying insect Post-procedure assessment: foreign body removed Patient tolerance: Patient tolerated the procedure well with no immediate complications   (including critical care time)  Medications Ordered in ED Medications - No data to display   Initial Impression / Assessment and Plan / ED Course  I have reviewed the triage vital signs and the nursing notes.  Pertinent labs & imaging results that were available during my care of the patient were reviewed by me and considered in my medical decision making (see chart for details).     Insect removed from patient's right here. No significant injury noted to the canal or TM. Patient advised to follow up with PCP as needed if he continues to have any ear pain. Return precautions discussed. Patient understands and agrees with plan. Patient vitals stable throughout ED  course and discharged in satisfactory condition. Patient also evaluated by Dr. Johnney Killian who guided the patient's management and agrees with plan.  Final Clinical Impressions(s) / ED Diagnoses   Final diagnoses:  Foreign body of right ear, initial encounter    New Prescriptions New Prescriptions   No medications on file  I personally performed the services described in this documentation, which was scribed in my presence. The recorded information has been reviewed and is accurate.    Frederica Kuster, PA-C 01/11/17 2236    Charlesetta Shanks, MD 01/16/17 [phone]

## 2017-01-11 NOTE — ED Notes (Signed)
Dr. Colvin Caroli into room.

## 2017-01-11 NOTE — ED Triage Notes (Signed)
PT presents with c/o bug in his right ear

## 2017-01-11 NOTE — ED Notes (Signed)
Alert, NAD, calm, interactive, resps e/u, speaking in clear complete sentences, no dyspnea noted, skin W&D, c/o foreign body/ bug in R ear, bright green bug noted, attempted H202 wash PTA, (denies: pain, sob, nausea, dizziness or visual changes), EDPA at [facility]. Family at [facility]  Bug removed with frazier suction and alligator forceps. Scant blood noted. Minimal wax present. TM clear intact.

## 2017-01-11 NOTE — Discharge Instructions (Signed)
Please follow-up with your primary care provider if you have any problems with your ear. Please return to the emergency department if you develop any new or worsening symptoms.

## 2017-01-15 ENCOUNTER — Ambulatory Visit: Payer: 59 | Admitting: Physical Therapy

## 2017-01-17 ENCOUNTER — Ambulatory Visit: Payer: 59 | Admitting: Physical Therapy

## 2017-01-18 NOTE — Progress Notes (Deleted)
  [facility] [place], [place] [address] Phone: [phone] Subjective:    I'm seeing this patient by the request  of:    CC: Knee pain  QBH:[id number]  Jonathan Edwards is a 56 y.o. male coming in with complaint of knee pain and stiffness. Patient and send this pain now going on 2-3 months. Seems to be in the anterior lateral aspect of the knee. Patient states certain things he seems to do well but twisting sensation causes worsening pain. States that even prolonged standing and when he lifts his leg she has worsening pain. Provider and Was Diagnosed with Potential Meniscal Injury. Patient was to start doing the over-the-counter anti-inflammatories, given a brace and to start formal physical therapy.    No past medical history on file. No past surgical history on file. Social History   Social History  . Marital status: Married    Spouse name: N/A  . Number of children: N/A  . Years of education: N/A   Social History Main Topics  . Smoking status: Former Smoker    Quit date: 08/12/1998  . Smokeless tobacco: Never Used  . Alcohol use No  . Drug use: No  . Sexual activity: Not on file   Other Topics Concern  . Not on file   Social History Narrative  . No narrative on file   No Known Allergies No family history on file.  Past medical history, social, surgical and family history all reviewed in electronic medical record.  No pertanent information unless stated regarding to the chief complaint.   Review of Systems:Review of systems updated and as accurate as of 01/18/17  No headache, visual changes, nausea, vomiting, diarrhea, constipation, dizziness, abdominal pain, skin rash, fevers, chills, night sweats, weight loss, swollen lymph nodes, body aches, joint swelling, muscle aches, chest pain, shortness of breath, mood changes.   Objective  There were no vitals taken for this visit. Systems examined below as of 01/18/17   General: No  apparent distress alert and oriented x3 mood and affect normal, dressed appropriately.  HEENT: Pupils equal, extraocular movements intact  Respiratory: Patient's speak in full sentences and does not appear short of breath  Cardiovascular: No lower extremity edema, non tender, no erythema  Skin: Warm dry intact with no signs of infection or rash on extremities or on axial skeleton.  Abdomen: Soft nontender  Neuro: Cranial nerves II through XII are intact, neurovascularly intact in all extremities with 2+ DTRs and 2+ pulses.  Lymph: No lymphadenopathy of posterior or anterior cervical chain or axillae bilaterally.  Gait normal with good balance and coordination.  MSK:  Non tender with full range of motion and good stability and symmetric strength and tone of shoulders, elbows, wrist, hip, knee and ankles bilaterally.     Impression and Recommendations:     This case required medical decision making of moderate complexity.      Note: This dictation was prepared with Dragon dictation along with smaller phrase technology. Any transcriptional errors that result from this process are unintentional.

## 2017-01-20 ENCOUNTER — Ambulatory Visit: Payer: 59 | Admitting: Family Medicine

## 2017-01-24 ENCOUNTER — Ambulatory Visit: Payer: BLUE CROSS/BLUE SHIELD | Attending: Family Medicine | Admitting: Physical Therapy

## 2017-01-24 ENCOUNTER — Encounter: Payer: Self-pay | Admitting: Physical Therapy

## 2017-01-24 ENCOUNTER — Telehealth: Payer: Self-pay | Admitting: Family Medicine

## 2017-01-24 DIAGNOSIS — M25561 Pain in right knee: Secondary | ICD-10-CM | POA: Diagnosis not present

## 2017-01-24 NOTE — Telephone Encounter (Signed)
Pt called stating that he saw on his MyChart that the appointment he had with Dr Tamala Julian on 01/20/2017 was marked as a no-show. She said that he called the office and spoke with the [place] to tell them that he was not coming a couple days before the appointment. He wanted to let us know so that he would not received a fee or have it marked as "no-show.

## 2017-01-24 NOTE — Telephone Encounter (Signed)
I have not.  Should be fine.

## 2017-01-24 NOTE — Telephone Encounter (Signed)
I have changed to a cancellation, just FYI to Tammy in case she has already run the NS report.

## 2017-01-24 NOTE — Therapy (Signed)
[facility] [address] [place], [place], [address] Phone: [phone]   Fax:  [phone]  Physical Therapy Evaluation  Patient Details  Name: Jonathan Edwards MRN: [id number] Date of Birth: 05-26-1961 Referring Provider: Barbaraann Barthel  Encounter Date: 01/24/2017      PT End of Session - 01/24/17 1004    Visit Number 1   Date for PT Re-Evaluation 02/23/17   PT Start Time 0930   PT Stop Time 1005   PT Time Calculation (min) 35 min   Activity Tolerance Patient tolerated treatment well   Behavior During Therapy [facility] for tasks assessed/performed      History reviewed. No pertinent past medical history.  History reviewed. No pertinent surgical history.  There were no vitals filed for this visit.       Subjective Assessment - 01/24/17 0940    Subjective Patient reports that he stepped wrong and felt pain in the lateral knee, X-rays showed mild arthritis, he was in an immobilizer for about two days.  Reports that he was not able to get up and down from sitting due to pain.  He reports that he is getting better since the pain started.     Limitations Standing;Walking;House hold activities;Lifting   Patient Stated Goals have no problems with the knee   Currently in Pain? Yes   Pain Score 0-No pain   Pain Location Knee   Pain Orientation Right   Pain Onset 1 to 4 weeks ago   Pain Frequency Intermittent   Aggravating Factors  getting up from sitting, squatting pain was up to 7-8/10   Pain Relieving Factors rest pain is a 0/10   Effect of Pain on Daily Activities limited walking and getting up from sitting            [facility] PT Assessment - 01/24/17 0001      Assessment   Medical Diagnosis right knee pain   Referring Provider [place]   Onset Date/Surgical Date 01/03/17   Prior Therapy no     Precautions   Precautions None     Balance Screen   Has the patient fallen in the past 6 months No   Has the patient had  a decrease in activity level because of a fear of falling?  No   Is the patient reluctant to leave their home because of a fear of falling?  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#   Leisure was running 2-3x/week 3-4 miles at a time     ROM / Strength   AROM / PROM / Strength AROM;Strength     AROM   Overall AROM Comments AROM of the right knee 8-110 degrees with pain for flexion     Strength   Overall Strength Comments 4+/5 with no pain     Flexibility   Soft Tissue Assessment /Muscle Length yes   Hamstrings very tight   Quadriceps tight   ITB very tight   Piriformis very tight with some knee pain     Palpation   Palpation comment mild tenderness lateral joint line     Special Tests    Special Tests --  pain with LCL stress test, no pain with compression test     Ambulation/Gait   Gait Comments no device, no deviation  Objective measurements completed on examination: See above findings.                  PT Education - 01/24/17 1004    Education provided Yes   Education Details LE flexibility   Person(s) Educated Patient   Methods Explanation;Demonstration;Handout   Comprehension Verbalized understanding;Returned demonstration             PT Long Term Goals - 01/24/17 1007      PT LONG TERM GOAL #1   Title independent with flexibility   Time 1   Period Weeks   Status Achieved                Plan - 01/24/17 1005    Clinical Impression Statement Patient reports pain about 3 weeks ago, MD felt like he may have a lateral meniscus tear.  He reports he is 80% better since the injury occurred and reports that he has not had much pain in the past 2 weeks.  He is very tight in the LE mms.  Had some pain with LCL test, no pain with meniscal compression test    Clinical Presentation Stable   Clinical Decision Making Low   Rehab Potential Excellent   PT Frequency 1x / week   PT Duration 4 weeks   PT Treatment/Interventions Cryotherapy;Electrical Stimulation;Iontophoresis 4mg /ml Dexamethasone;Moist Heat;Therapeutic activities;Therapeutic exercise;Patient/family education;Manual techniques;Taping   PT Next Visit Plan patient is to try the flexibility tests, he is to try to start jogging after a week of flexibility, then adding time every week if no issues   Consulted and Agree with Plan of Care Patient      Patient will benefit from skilled therapeutic intervention in order to improve the following deficits and impairments:  Pain, Impaired flexibility  Visit Diagnosis: Acute pain of right knee - Plan: PT plan of care cert/re-cert     Problem List Patient Active Problem List   Diagnosis Date Noted  . Right knee pain 01/07/2017  . Midline low back pain without sciatica 04/22/2016  . Acute bronchitis 10/03/2014  . Plantar fasciitis of right foot 09/27/2014  . Subacromial bursitis 08/16/2014  . HERNIA, UMBILICAL 16/05/9603  . PROSTATITIS, MILD 05/17/2008  . Essential hypertension 12/26/2006  . Allergic rhinitis 12/26/2006    Sumner Boast., PT 01/24/2017, 10:10 AM  [address] [place], [place], [address] Phone: [phone]   Fax:  [phone]  Name: Amedeo Detweiler MRN: [id number] Date of Birth: [birth year]

## 2017-02-14 ENCOUNTER — Ambulatory Visit (INDEPENDENT_AMBULATORY_CARE_PROVIDER_SITE_OTHER): Payer: BLUE CROSS/BLUE SHIELD | Admitting: Family Medicine

## 2017-02-14 ENCOUNTER — Encounter: Payer: Self-pay | Admitting: Family Medicine

## 2017-02-14 DIAGNOSIS — M25561 Pain in right knee: Secondary | ICD-10-CM

## 2017-02-14 NOTE — Patient Instructions (Signed)
You have a lateral meniscus tear. You are clinically much improved. Ice the knee 15 minutes at a time 3-4 times a day. Aleve 2 tabs twice a day with food for pain and inflammation as needed. Continue home exercises a few times a week until completely improved. Avoid deep squats, lunges, twisting maneuvers as much as possible. Follow up with me as needed. Consider injection, MRI if you're struggling.

## 2017-02-18 NOTE — Progress Notes (Signed)
PCP: Marletta Lor, MD  Subjective:   HPI: Patient is a 56 y.o. male here for right knee pain.  5/25: Patient reports he's had about 2 months of anterior, lateral right knee pain. Sits a lot and notices more after this. Pain is 1/10 at rest, up to 4/10 at worst and sharp. Also bothers with prolonged standing and lifting his leg. No skin changes, numbness.  7/6: Patient reports he feels well. Occasionally taking aleve. Feels stiff at times but much improved. Doing home exercises. Pain level 0/10. No skin changes, numbness.  No past medical history on file.  Current Outpatient Prescriptions on File Prior to Visit  Medication Sig Dispense Refill  . felodipine (PLENDIL) 5 MG 24 hr tablet TAKE 1 TABLET (5 MG TOTAL) BY MOUTH DAILY. 90 tablet 1  . ibuprofen (ADVIL,MOTRIN) 600 MG tablet Take 1 tablet (600 mg total) by mouth every 6 (six) hours as needed. 30 tablet 0  . lisinopril-hydrochlorothiazide (PRINZIDE,ZESTORETIC) 20-12.5 MG tablet TAKE 1 TABLET BY MOUTH DAILY. 90 tablet 1  . tadalafil (CIALIS) 10 MG tablet Take 1 tablet (10 mg total) by mouth daily as needed for erectile dysfunction. 30 tablet 3   No current facility-administered medications on file prior to visit.     No past surgical history on file.  No Known Allergies  Social History   Social History  . Marital status: Married    Spouse name: N/A  . Number of children: N/A  . Years of education: N/A   Occupational History  . Not on file.   Social History Main Topics  . Smoking status: Former Smoker    Quit date: 08/12/1998  . Smokeless tobacco: Never Used  . Alcohol use No  . Drug use: No  . Sexual activity: Not on file   Other Topics Concern  . Not on file   Social History Narrative  . No narrative on file    No family history on file.  BP 130/85   Pulse 87   Ht 6\' 2"  (1.88 m)   Wt 225 lb (102.1 kg)   BMI 28.89 kg/m   Review of Systems: See HPI above.     Objective:  Physical  Exam:  Gen: NAD, comfortable in exam room  Right knee: No gross deformity, ecchymoses.  Mild effusion. No TTP currently. FROM. Negative ant/post drawers. Negative valgus/varus testing. Negative lachmanns. Negative mcmurrays, apleys, thessalys.  Negative patellar apprehension. NV intact distally.  Left knee: FROM without pain.   Assessment & Plan:  1. Right knee pain - radiographs negative.  Consistent with meniscus tear laterally.  Icing, aleve, continue home exercises.  Avoid deep squats, lunges, twisting.  Consider injection, MRI if he doesn't continue to improve.  F/u prn.

## 2017-02-18 NOTE — Assessment & Plan Note (Signed)
radiographs negative.  Consistent with meniscus tear laterally.  Icing, aleve, continue home exercises.  Avoid deep squats, lunges, twisting.  Consider injection, MRI if he doesn't continue to improve.  F/u prn.

## 2017-05-01 ENCOUNTER — Encounter: Payer: Self-pay | Admitting: Internal Medicine

## 2017-05-15 ENCOUNTER — Encounter: Payer: BLUE CROSS/BLUE SHIELD | Admitting: Internal Medicine

## 2017-06-05 ENCOUNTER — Other Ambulatory Visit: Payer: Self-pay | Admitting: Internal Medicine

## 2017-06-23 NOTE — Progress Notes (Signed)
[name] [address], [place] [address] Phone: [phone] Subjective:     CC: Right knee pain.  [id number]  Jonathan Edwards is a 56 y.o. male coming in with complaint of right knee pain.  To me.  Had seen another provider and diagnosed with a potential meniscal tear this was May 2018.  Had x-rays at that time.  These were independently visualized by me showed very minimal osteoarthritic changes patient was sent to physical therapy.  Patient did follow-up with another provider back in July and was making some improvement.  Patient states it feels like his knee gives out. He says he walks with a limp.   Onset- May Location- Lateral knee  Duration- At night  Character- Dull  Aggravating factors- bending, stairs  Reliving factors-  Therapies tried- Ice, heat, aleve  Severity-     No past medical history on file. No past surgical history on file. Social History   Socioeconomic History  . Marital status: Married    Spouse name: None  . Number of children: None  . Years of education: None  . Highest education level: None  Social Needs  . Financial resource strain: None  . Food insecurity - worry: None  . Food insecurity - inability: None  . Transportation needs - medical: None  . Transportation needs - non-medical: None  Occupational History  . None  Tobacco Use  . Smoking status: Former Smoker    Last attempt to quit: 08/12/1998    Years since quitting: 18.8  . Smokeless tobacco: Never Used  Substance and Sexual Activity  . Alcohol use: No  . Drug use: No  . Sexual activity: None  Other Topics Concern  . None  Social History Narrative  . None   No Known Allergies No family history on file.  No history of autoimmune disease or gout   Past medical history, social, surgical and family history all reviewed in electronic medical record.  No pertanent information unless stated regarding to the chief complaint.   Review of  Systems:Review of systems updated and as accurate as of 06/24/17  No headache, visual changes, nausea, vomiting, diarrhea, constipation, dizziness, abdominal pain, skin rash, fevers, chills, night sweats, weight loss, swollen lymph nodes, body aches, joint swelling, muscle aches, chest pain, shortness of breath, mood changes.   Objective  Blood pressure 124/70, pulse 76, height 6\' 1"  (1.854 m), weight 240 lb (108.9 kg), SpO2 94 %. Systems examined below as of 06/24/17   General: No apparent distress alert and oriented x3 mood and affect normal, dressed appropriately.  HEENT: Pupils equal, extraocular movements intact  Respiratory: Patient's speak in full sentences and does not appear short of breath  Cardiovascular: No lower extremity edema, non tender, no erythema  Skin: Warm dry intact with no signs of infection or rash on extremities or on axial skeleton.  Abdomen: Soft nontender  Neuro: Cranial nerves II through XII are intact, neurovascularly intact in all extremities with 2+ DTRs and 2+ pulses.  Lymph: No lymphadenopathy of posterior or anterior cervical chain or axillae bilaterally.  Gait normal with good balance and coordination.  MSK:  Non tender with full range of motion and good stability and symmetric strength and tone of shoulders, elbows, wrist, hip, and ankles bilaterally.  Knee: Right Effusion noted Palpation normal with no warmth, joint line tenderness, patellar tenderness, or condyle tenderness. ROM full in flexion and extension and lower leg rotation. Ligaments with solid consistent endpoints including  ACL, PCL, LCL, MCL. Positive Mcmurray's, Apley's, and Thessalonian tests. Mild painful patellar compression. Patellar glide with mild crepitus. Patellar and quadriceps tendons unremarkable. Hamstring and quadriceps strength is normal. Contralateral knee unremarkable  MSK US performed of: Right knee This study was ordered, performed, and interpreted by Charlann Boxer  D.O.  Knee: Knee effusion noted.  Patient does also have what appears to be significant synovitis.  Lateral meniscus does have a tear in approximately 50% with mild 25% displacement.  LCL degenerative tearing but seems to be mostly intact.  Mild arthritic changes  IMPRESSION: Mild arthritis, synovitis with knee effusion and lateral meniscal tear  Procedure: Real-time Ultrasound Guided Injection of right knee Device: GE Logiq Q7 Ultrasound guided injection is preferred based studies that show increased duration, increased effect, greater accuracy, decreased procedural pain, increased response rate, and decreased cost with ultrasound guided versus blind injection.  Verbal informed consent obtained.  Time-out conducted.  Noted no overlying erythema, induration, or other signs of local infection.  Skin prepped in a sterile fashion.  Local anesthesia: Topical Ethyl chloride.  With sterile technique and under real time ultrasound guidance: With a 22-gauge 2 inch needle patient was injected with 4 cc of 0.5% Marcaine and aspirated 50 cc of straw light colored fluid then injected 1 cc of Kenalog 40 mg/dL. This was from a superior lateral approach.  Completed without difficulty  Pain immediately resolved suggesting accurate placement of the medication.  Advised to call if fevers/chills, erythema, induration, drainage, or persistent bleeding.  Images permanently stored and available for review in the ultrasound unit.  Impression: Technically successful ultrasound guided injection.    Impression and Recommendations:     This case required medical decision making of moderate complexity.      Note: This dictation was prepared with Dragon dictation along with smaller phrase technology. Any transcriptional errors that result from this process are unintentional.

## 2017-06-24 ENCOUNTER — Ambulatory Visit: Payer: Self-pay

## 2017-06-24 ENCOUNTER — Encounter: Payer: Self-pay | Admitting: Family Medicine

## 2017-06-24 ENCOUNTER — Other Ambulatory Visit: Payer: BLUE CROSS/BLUE SHIELD

## 2017-06-24 ENCOUNTER — Ambulatory Visit (INDEPENDENT_AMBULATORY_CARE_PROVIDER_SITE_OTHER): Payer: BLUE CROSS/BLUE SHIELD | Admitting: Family Medicine

## 2017-06-24 VITALS — BP 124/70 | HR 76 | Ht 73.0 in | Wt 240.0 lb

## 2017-06-24 DIAGNOSIS — S83261A Peripheral tear of lateral meniscus, current injury, right knee, initial encounter: Secondary | ICD-10-CM | POA: Diagnosis not present

## 2017-06-24 DIAGNOSIS — G8929 Other chronic pain: Secondary | ICD-10-CM | POA: Diagnosis not present

## 2017-06-24 DIAGNOSIS — M25561 Pain in right knee: Principal | ICD-10-CM

## 2017-06-24 DIAGNOSIS — M659 Synovitis and tenosynovitis, unspecified: Secondary | ICD-10-CM

## 2017-06-24 DIAGNOSIS — S83289A Other tear of lateral meniscus, current injury, unspecified knee, initial encounter: Secondary | ICD-10-CM | POA: Insufficient documentation

## 2017-06-24 NOTE — Patient Instructions (Signed)
Good to see you  Ice 20 minutes 2 times daily. Usually after activity and before bed. Exercises 3 times a week.  Lets see how the knee does.  I will send this to labs See me again in 3 weeks and if swelling comes back we may need MRI

## 2017-06-24 NOTE — Assessment & Plan Note (Signed)
Continue consider conservative therapy.  Possible MRI will be needed.  Having some mild locking.

## 2017-06-24 NOTE — Assessment & Plan Note (Signed)
Patient did have aspiration done.  We discussed with patient in great length about icing regimen, home exercises, which activities doing which wants to avoid.  Discussed with patient about compression, home exercises, icing regimen.  See patient again back in 3 weeks.  Worsening symptoms we would need MRI with patient having fairly dendritic changes of the lining.  Would need to rule out poly-villonodular synovitis

## 2017-06-25 LAB — SYNOVIAL CELL COUNT + DIFF, W/ CRYSTALS
Basophils, %: 0 %
Eosinophils-Synovial: 0 % (ref 0–2)
LYMPHOCYTES-SYNOVIAL FLD: 6 % (ref 0–74)
MONOCYTE/MACROPHAGE: 88 % — AB (ref 0–69)
Neutrophil, Synovial: 1 % (ref 0–24)
Synoviocytes, %: 5 % (ref 0–15)
WBC, SYNOVIAL: 217 {cells}/uL — AB (ref <150)

## 2017-06-25 LAB — TIQ-NTM

## 2017-07-07 ENCOUNTER — Ambulatory Visit (INDEPENDENT_AMBULATORY_CARE_PROVIDER_SITE_OTHER): Payer: BLUE CROSS/BLUE SHIELD | Admitting: Internal Medicine

## 2017-07-07 ENCOUNTER — Encounter: Payer: Self-pay | Admitting: Internal Medicine

## 2017-07-07 VITALS — BP 128/84 | HR 83 | Temp 98.1°F | Ht 73.0 in | Wt 241.8 lb

## 2017-07-07 DIAGNOSIS — Z125 Encounter for screening for malignant neoplasm of prostate: Secondary | ICD-10-CM

## 2017-07-07 DIAGNOSIS — Z0000 Encounter for general adult medical examination without abnormal findings: Secondary | ICD-10-CM

## 2017-07-07 DIAGNOSIS — Z23 Encounter for immunization: Secondary | ICD-10-CM | POA: Diagnosis not present

## 2017-07-07 LAB — CBC WITH DIFFERENTIAL/PLATELET
BASOS ABS: 0.1 10*3/uL (ref 0.0–0.1)
Basophils Relative: 0.8 % (ref 0.0–3.0)
EOS ABS: 0.1 10*3/uL (ref 0.0–0.7)
Eosinophils Relative: 1.9 % (ref 0.0–5.0)
HCT: 45.8 % (ref 39.0–52.0)
Hemoglobin: 15 g/dL (ref 13.0–17.0)
LYMPHS ABS: 1.8 10*3/uL (ref 0.7–4.0)
Lymphocytes Relative: 25.4 % (ref 12.0–46.0)
MCHC: 32.7 g/dL (ref 30.0–36.0)
MCV: 83.3 fl (ref 78.0–100.0)
MONO ABS: 0.7 10*3/uL (ref 0.1–1.0)
Monocytes Relative: 9.6 % (ref 3.0–12.0)
NEUTROS ABS: 4.5 10*3/uL (ref 1.4–7.7)
NEUTROS PCT: 62.3 % (ref 43.0–77.0)
Platelets: 178 10*3/uL (ref 150.0–400.0)
RBC: 5.5 Mil/uL (ref 4.22–5.81)
RDW: 14.7 % (ref 11.5–15.5)
WBC: 7.2 10*3/uL (ref 4.0–10.5)

## 2017-07-07 LAB — COMPREHENSIVE METABOLIC PANEL
ALBUMIN: 3.9 g/dL (ref 3.5–5.2)
ALK PHOS: 40 U/L (ref 39–117)
ALT: 22 U/L (ref 0–53)
AST: 23 U/L (ref 0–37)
BILIRUBIN TOTAL: 0.5 mg/dL (ref 0.2–1.2)
BUN: 18 mg/dL (ref 6–23)
CALCIUM: 9.1 mg/dL (ref 8.4–10.5)
CO2: 27 meq/L (ref 19–32)
CREATININE: 1.09 mg/dL (ref 0.40–1.50)
Chloride: 106 mEq/L (ref 96–112)
GFR: 89.31 mL/min (ref >60.00)
Glucose, Bld: 96 mg/dL (ref 70–99)
Potassium: 4.2 mEq/L (ref 3.5–5.1)
Sodium: 142 mEq/L (ref 135–145)
TOTAL PROTEIN: 6.2 g/dL (ref 6.0–8.3)

## 2017-07-07 LAB — LIPID PANEL
CHOLESTEROL: 158 mg/dL (ref 0–200)
HDL: 42 mg/dL (ref >39.00)
LDL Cholesterol: 102 mg/dL — ABNORMAL HIGH (ref 0–99)
NonHDL: 116.36
TRIGLYCERIDES: 70 mg/dL (ref 0.0–149.0)
Total CHOL/HDL Ratio: 4
VLDL: 14 mg/dL (ref 0.0–40.0)

## 2017-07-07 LAB — PSA: PSA: 3.28 ng/mL (ref 0.10–4.00)

## 2017-07-07 LAB — TSH: TSH: 1.63 u[IU]/mL (ref 0.35–4.50)

## 2017-07-07 NOTE — Progress Notes (Signed)
Subjective:    Patient ID: Jonathan Edwards, male    DOB: [birth year], 56 y.o.   MRN: [id number]  HPI  56 year old patient who is seen today for preventive health examination  He has been seen by orthopedics recently for right knee pain associated with effusion.  He has been felt to have a meniscal tear.  He has essential hypertension which has been well controlled on medications.  Colonoscopy 2011  He does have some history of occasional low back and flank discomfort.  Otherwise no concerns or complaints  Family history both parents died at age 42 mother with hypertension diabetes and died of complications of lymphoma.  Father died of advanced COPD  History reviewed. No pertinent past medical history.   Social History   Socioeconomic History  . Marital status: Married    Spouse name: Not on file  . Number of children: Not on file  . Years of education: Not on file  . Highest education level: Not on file  Social Needs  . Financial resource strain: Not on file  . Food insecurity - worry: Not on file  . Food insecurity - inability: Not on file  . Transportation needs - medical: Not on file  . Transportation needs - non-medical: Not on file  Occupational History  . Not on file  Tobacco Use  . Smoking status: Former Smoker    Last attempt to quit: 08/12/1998    Years since quitting: 18.9  . Smokeless tobacco: Never Used  Substance and Sexual Activity  . Alcohol use: No  . Drug use: No  . Sexual activity: Not on file  Other Topics Concern  . Not on file  Social History Narrative  . Not on file    History reviewed. No pertinent surgical history.  History reviewed. No pertinent family history.  No Known Allergies  Current Outpatient Medications on File Prior to Visit  Medication Sig Dispense Refill  . felodipine (PLENDIL) 5 MG 24 hr tablet TAKE 1 TABLET BY MOUTH ONCE DAILY 90 tablet 1  . ibuprofen (ADVIL,MOTRIN) 600 MG tablet Take 1 tablet (600 mg total) by mouth every  6 (six) hours as needed. 30 tablet 0  . lisinopril-hydrochlorothiazide (PRINZIDE,ZESTORETIC) 20-12.5 MG tablet TAKE 1 TABLET BY MOUTH DAILY 90 tablet 1  . tadalafil (CIALIS) 10 MG tablet Take 1 tablet (10 mg total) by mouth daily as needed for erectile dysfunction. 30 tablet 3   No current facility-administered medications on file prior to visit.     BP 128/84 (BP Location: Left Arm, Patient Position: Sitting, Cuff Size: Normal)   Pulse 83   Temp 98.1 F (36.7 C) (Oral)   Ht 6\' 1"  (1.854 m)   Wt 241 lb 12.8 oz (109.7 kg)   SpO2 (!) 83%   BMI 31.90 kg/m     Review of Systems  Constitutional: Negative for appetite change, chills, fatigue and fever.  HENT: Negative for congestion, dental problem, ear pain, hearing loss, sore throat, tinnitus, trouble swallowing and voice change.   Eyes: Negative for pain, discharge and visual disturbance.       Occasional floater involving the left eye  Respiratory: Negative for cough, chest tightness, wheezing and stridor.   Cardiovascular: Negative for chest pain, palpitations and leg swelling.  Gastrointestinal: Negative for abdominal distention, abdominal pain, blood in stool, constipation, diarrhea, nausea and vomiting.  Genitourinary: Negative for difficulty urinating, discharge, flank pain, genital sores, hematuria and urgency.  Musculoskeletal: Positive for back pain. Negative for arthralgias, gait problem, joint  swelling, myalgias and neck stiffness.  Skin: Negative for rash.  Neurological: Negative for dizziness, syncope, speech difficulty, weakness, numbness and headaches.  Hematological: Negative for adenopathy. Does not bruise/bleed easily.  Psychiatric/Behavioral: Negative for behavioral problems and dysphoric mood. The patient is not nervous/anxious.        Objective:   Physical Exam  Constitutional: He is oriented to person, place, and time. He appears well-developed.  HENT:  Head: Normocephalic.  Right Ear: External ear normal.    Left Ear: External ear normal.  Eyes: Conjunctivae and EOM are normal.  Neck: Normal range of motion.  Cardiovascular: Normal rate and normal heart sounds.  Decreased left dorsalis pedis pulse  Pulmonary/Chest: Breath sounds normal.  Abdominal: Bowel sounds are normal.  Genitourinary: Prostate normal.  Musculoskeletal: Normal range of motion. He exhibits no edema or tenderness.  Neurological: He is alert and oriented to person, place, and time.  Psychiatric: He has a normal mood and affect. His behavior is normal.          Assessment & Plan:   Preventive health examination Essential hypertension well-controlled Obesity.  Weight loss encouraged Low back pain.  Sounds more muscular ligamentous.  Will start some stretching/rehab type exercises  Home blood pressure monitoring encouraged Check screening lab Follow-up one year or as needed  Nyoka Cowden

## 2017-07-07 NOTE — Patient Instructions (Addendum)
Limit your sodium (Salt) intake  Please check your blood pressure on a regular basis.  If it is consistently greater than 150/90, please make an office appointment.    It is important that you exercise regularly, at least 20 minutes 3 to 4 times per week.  If you develop chest pain or shortness of breath seek  medical attention.  You need to lose weight.  Consider a lower calorie diet and regular exercise.   Back Exercises The following exercises strengthen the muscles that help to support the back. They also help to keep the lower back flexible. Doing these exercises can help to prevent back pain or lessen existing pain. If you have back pain or discomfort, try doing these exercises 2-3 times each day or as told by your health care provider. When the pain goes away, do them once each day, but increase the number of times that you repeat the steps for each exercise (do more repetitions). If you do not have back pain or discomfort, do these exercises once each day or as told by your health care provider. Exercises Single Knee to Chest  Repeat these steps 3-5 times for each leg: 1. Lie on your back on a firm bed or the floor with your legs extended. 2. Bring one knee to your chest. Your other leg should stay extended and in contact with the floor. 3. Hold your knee in place by grabbing your knee or thigh. 4. Pull on your knee until you feel a gentle stretch in your lower back. 5. Hold the stretch for 10-30 seconds. 6. Slowly release and straighten your leg.  Pelvic Tilt  Repeat these steps 5-10 times: 1. Lie on your back on a firm bed or the floor with your legs extended. 2. Bend your knees so they are pointing toward the ceiling and your feet are flat on the floor. 3. Tighten your lower abdominal muscles to press your lower back against the floor. This motion will tilt your pelvis so your tailbone points up toward the ceiling instead of pointing to your feet or the floor. 4. With gentle  tension and even breathing, hold this position for 5-10 seconds.  Cat-Cow  Repeat these steps until your lower back becomes more flexible: 1. Get into a hands-and-knees position on a firm surface. Keep your hands under your shoulders, and keep your knees under your hips. You may place padding under your knees for comfort. 2. Let your head hang down, and point your tailbone toward the floor so your lower back becomes rounded like the back of a cat. 3. Hold this position for 5 seconds. 4. Slowly lift your head and point your tailbone up toward the ceiling so your back forms a sagging arch like the back of a cow. 5. Hold this position for 5 seconds.  Press-Ups  Repeat these steps 5-10 times: 1. Lie on your abdomen (face-down) on the floor. 2. Place your palms near your head, about shoulder-width apart. 3. While you keep your back as relaxed as possible and keep your hips on the floor, slowly straighten your arms to raise the top half of your body and lift your shoulders. Do not use your back muscles to raise your upper torso. You may adjust the placement of your hands to make yourself more comfortable. 4. Hold this position for 5 seconds while you keep your back relaxed. 5. Slowly return to lying flat on the floor.  Bridges  Repeat these steps 10 times: 1. Lie on  your back on a firm surface. 2. Bend your knees so they are pointing toward the ceiling and your feet are flat on the floor. 3. Tighten your buttocks muscles and lift your buttocks off of the floor until your waist is at almost the same height as your knees. You should feel the muscles working in your buttocks and the back of your thighs. If you do not feel these muscles, slide your feet 1-2 inches farther away from your buttocks. 4. Hold this position for 3-5 seconds. 5. Slowly lower your hips to the starting position, and allow your buttocks muscles to relax completely.  If this exercise is too easy, try doing it with your arms  crossed over your chest. Abdominal Crunches  Repeat these steps 5-10 times: 1. Lie on your back on a firm bed or the floor with your legs extended. 2. Bend your knees so they are pointing toward the ceiling and your feet are flat on the floor. 3. Cross your arms over your chest. 4. Tip your chin slightly toward your chest without bending your neck. 5. Tighten your abdominal muscles and slowly raise your trunk (torso) high enough to lift your shoulder blades a tiny bit off of the floor. Avoid raising your torso higher than that, because it can put too much stress on your low back and it does not help to strengthen your abdominal muscles. 6. Slowly return to your starting position.  Back Lifts Repeat these steps 5-10 times: 1. Lie on your abdomen (face-down) with your arms at your sides, and rest your forehead on the floor. 2. Tighten the muscles in your legs and your buttocks. 3. Slowly lift your chest off of the floor while you keep your hips pressed to the floor. Keep the back of your head in line with the curve in your back. Your eyes should be looking at the floor. 4. Hold this position for 3-5 seconds. 5. Slowly return to your starting position.  Contact a health care provider if:  Your back pain or discomfort gets much worse when you do an exercise.  Your back pain or discomfort does not lessen within 2 hours after you exercise. If you have any of these problems, stop doing these exercises right away. Do not do them again unless your health care provider says that you can. Get help right away if:  You develop sudden, severe back pain. If this happens, stop doing the exercises right away. Do not do them again unless your health care provider says that you can. This information is not intended to replace advice given to you by your health care provider. Make sure you discuss any questions you have with your health care provider. Document Released: 09/05/2004 Document Revised:  12/06/2015 Document Reviewed: 09/22/2014 Elsevier Interactive Patient Education  2017 [employer].

## 2017-07-14 NOTE — Progress Notes (Signed)
[name] [address] [place], [place] [address] Phone: [phone] Subjective:    I'm seeing this patient by the request  of:    CC: Right knee pain follow-up  JKK:[id number]  Jonathan Edwards is a 56 y.o. male coming in with complaint of right knee pain.  Found to have a significant effusion as well as what appeared to be a synovitis and a lateral meniscal.  Patient did have aspiration of the fluid and then injected.  Patient was to do compression, home exercises, and icing regimen.  Patient's knee is 95% better.  States that if he does a lot of activity he notices it but nothing severe.  No clicking, locking or giving out on a   Patient states he has lower back pain. Says it is getting better. Chronic.  She has had more of a dull throbbing aching pain.  Does do a lot of driving.  Notices after sitting a long amount of time he.  Patient denies any radiation.  Any weakness.  Rates the severity of pain overall is 4 out of 10   Patient did have x-rays taken Dec 31, 2016 there were independently visualized by me showing mild osteoarthritic changes.  Also revealed patient's previous ultrasound on June 27, 2017     No past medical history on file. No past surgical history on file. Social History   Socioeconomic History  . Marital status: Married    Spouse name: Not on file  . Number of children: Not on file  . Years of education: Not on file  . Highest education level: Not on file  Social Needs  . Financial resource strain: Not on file  . Food insecurity - worry: Not on file  . Food insecurity - inability: Not on file  . Transportation needs - medical: Not on file  . Transportation needs - non-medical: Not on file  Occupational History  . Not on file  Tobacco Use  . Smoking status: Former Smoker    Last attempt to quit: 08/12/1998    Years since quitting: 18.9  . Smokeless tobacco: Never Used  Substance and Sexual Activity  . Alcohol use: No    . Drug use: No  . Sexual activity: Not on file  Other Topics Concern  . Not on file  Social History Narrative  . Not on file   No Known Allergies No family history on file.  No family history of autoimmune disease   Past medical history, social, surgical and family history all reviewed in electronic medical record.  No pertanent information unless stated regarding to the chief complaint.   Review of Systems:Review of systems updated and as accurate as of 07/14/17  No headache, visual changes, nausea, vomiting, diarrhea, constipation, dizziness, abdominal pain, skin rash, fevers, chills, night sweats, weight loss, swollen lymph nodes, body aches, joint swelling, muscle aches, chest pain, shortness of breath, mood changes.   Objective  There were no vitals taken for this visit. Systems examined below as of 07/14/17   General: No apparent distress alert and oriented x3 mood and affect normal, dressed appropriately.  HEENT: Pupils equal, extraocular movements intact  Respiratory: Patient's speak in full sentences and does not appear short of breath  Cardiovascular: No lower extremity edema, non tender, no erythema  Skin: Warm dry intact with no signs of infection or rash on extremities or on axial skeleton.  Abdomen: Soft nontender  Neuro: Cranial nerves II through XII are intact, neurovascularly intact in  all extremities with 2+ DTRs and 2+ pulses.  Lymph: No lymphadenopathy of posterior or anterior cervical chain or axillae bilaterally.  Gait normal with good balance and coordination.  MSK:  Non tender with full range of motion and good stability and symmetric strength and tone of shoulders, elbows, wrist, hip, and ankles bilaterally.  Knee: Right Normal to inspection with no erythema or effusion or obvious bony abnormalities. Palpation normal with no warmth, joint line tenderness, patellar tenderness, or condyle tenderness. ROM full in flexion and extension and lower leg  rotation. Ligaments with solid consistent endpoints including ACL, PCL, LCL, MCL. Negative Mcmurray's, Apley's, and Thessalonian tests. Non painful patellar compression. Patellar glide mild crepitus. Patellar and quadriceps tendons unremarkable. Hamstring and quadriceps strength is normal. Contralateral knee unremarkable  Back Exam:  Inspection: Mild loss of lordosis Motion: Flexion 45 deg, Extension 25 deg, Side Bending to 45 deg bilaterally,  Rotation to 45 deg bilaterally  SLR laying: Negative  XSLR laying: Negative  Palpable tenderness: Mild tenderness to palpation in the L2-L3 region on the right side. FABER: negative. Sensory change: Gross sensation intact to all lumbar and sacral dermatomes.  Reflexes: 2+ at both patellar tendons, 2+ at achilles tendons, Babinski's downgoing.  Strength at foot  Plantar-flexion: 5/5 Dorsi-flexion: 5/5 Eversion: 5/5 Inversion: 5/5  Leg strength  Quad: 5/5 Hamstring: 5/5 Hip flexor: 5/5 Hip abductors: 5/5  Gait unremarkable.      Impression and Recommendations:     This case required medical decision making of moderate complexity.      Note: This dictation was prepared with Dragon dictation along with smaller phrase technology. Any transcriptional errors that result from this process are unintentional.

## 2017-07-15 ENCOUNTER — Ambulatory Visit (INDEPENDENT_AMBULATORY_CARE_PROVIDER_SITE_OTHER): Payer: BLUE CROSS/BLUE SHIELD | Admitting: Family Medicine

## 2017-07-15 ENCOUNTER — Encounter: Payer: Self-pay | Admitting: Family Medicine

## 2017-07-15 DIAGNOSIS — S83261A Peripheral tear of lateral meniscus, current injury, right knee, initial encounter: Secondary | ICD-10-CM | POA: Diagnosis not present

## 2017-07-15 DIAGNOSIS — M24551 Contracture, right hip: Secondary | ICD-10-CM | POA: Diagnosis not present

## 2017-07-15 NOTE — Assessment & Plan Note (Signed)
Patient was found to have more of a tight hip flexor.  Likely secondary to his occupation with the significant amount of sitting.  Given exercises to try to go with the stretching.  I do not feel that there is any significant bony abnormality and no x-rays taken today.  If any radicular symptoms this could change management.  Patient given home exercise by athletic trainer today.  Follow-up again in 4-6 weeks

## 2017-07-15 NOTE — Assessment & Plan Note (Signed)
Improved after the aspiration.  Does have some follow.  Follow-up again in 6-8 weeks with him continuing conservative therapy.

## 2017-07-15 NOTE — Patient Instructions (Signed)
Good to see you  You will do great  Ice 20 minutes 2 times daily. Usually after activity and before bed. Exercises 3 times a week.  Tyr to stretch when you come out of the car Stay hydrated at night as well  Continue the vitamins See me again in 6-8 weeks if not perfect

## 2017-09-09 ENCOUNTER — Ambulatory Visit: Payer: BLUE CROSS/BLUE SHIELD | Admitting: Family Medicine

## 2018-05-04 ENCOUNTER — Encounter: Payer: BLUE CROSS/BLUE SHIELD | Admitting: Family Medicine

## 2018-05-23 ENCOUNTER — Other Ambulatory Visit: Payer: Self-pay | Admitting: Internal Medicine

## 2018-05-25 MED ORDER — FELODIPINE ER 5 MG PO TB24: 5.0000 mg | ORAL_TABLET | Freq: Every day | ORAL | 1 refills | Status: DC

## 2018-05-25 NOTE — Telephone Encounter (Signed)
Pt aware to follow up with new PCP for further refills due to Dr.Kwiakowski's retiring. Rx sent in for 3 months. Pt will follow-up with new provider.

## 2018-07-13 ENCOUNTER — Encounter: Payer: BLUE CROSS/BLUE SHIELD | Admitting: Family Medicine

## 2018-09-01 ENCOUNTER — Encounter: Payer: Self-pay | Admitting: Internal Medicine

## 2018-10-08 ENCOUNTER — Encounter: Payer: BLUE CROSS/BLUE SHIELD | Admitting: Adult Health

## 2018-10-08 ENCOUNTER — Encounter: Payer: Self-pay | Admitting: Family Medicine

## 2018-10-08 ENCOUNTER — Ambulatory Visit (INDEPENDENT_AMBULATORY_CARE_PROVIDER_SITE_OTHER): Payer: 59 | Admitting: Family Medicine

## 2018-10-08 VITALS — BP 130/80 | HR 60 | Ht 73.0 in | Wt 247.5 lb

## 2018-10-08 DIAGNOSIS — I10 Essential (primary) hypertension: Secondary | ICD-10-CM

## 2018-10-08 DIAGNOSIS — Z0000 Encounter for general adult medical examination without abnormal findings: Secondary | ICD-10-CM | POA: Diagnosis not present

## 2018-10-08 DIAGNOSIS — R972 Elevated prostate specific antigen [PSA]: Secondary | ICD-10-CM

## 2018-10-08 DIAGNOSIS — N5201 Erectile dysfunction due to arterial insufficiency: Secondary | ICD-10-CM | POA: Insufficient documentation

## 2018-10-08 DIAGNOSIS — Z0001 Encounter for general adult medical examination with abnormal findings: Secondary | ICD-10-CM | POA: Diagnosis not present

## 2018-10-08 DIAGNOSIS — M720 Palmar fascial fibromatosis [Dupuytren]: Secondary | ICD-10-CM | POA: Diagnosis not present

## 2018-10-08 DIAGNOSIS — Z13220 Encounter for screening for lipoid disorders: Secondary | ICD-10-CM | POA: Insufficient documentation

## 2018-10-08 MED ORDER — LISINOPRIL-HYDROCHLOROTHIAZIDE 20-12.5 MG PO TABS: 1.0000 | ORAL_TABLET | Freq: Every day | ORAL | 1 refills | Status: DC

## 2018-10-08 MED ORDER — FELODIPINE ER 5 MG PO TB24: 5.0000 mg | ORAL_TABLET | Freq: Every day | ORAL | 1 refills | Status: DC

## 2018-10-08 MED ORDER — TADALAFIL 10 MG PO TABS: 10.0000 mg | ORAL_TABLET | Freq: Every day | ORAL | 3 refills | Status: DC | PRN

## 2018-10-08 NOTE — Patient Instructions (Signed)
Health Maintenance, Male A healthy lifestyle and preventive care is important for your health and wellness. Ask your health care provider about what schedule of regular examinations is right for you. What should I know about weight and diet? Eat a Healthy Diet  Eat plenty of vegetables, fruits, whole grains, low-fat dairy products, and lean protein.  Do not eat a lot of foods high in solid fats, added sugars, or salt.  Maintain a Healthy Weight Regular exercise can help you achieve or maintain a healthy weight. You should:  Do at least 150 minutes of exercise each week. The exercise should increase your heart rate and make you sweat (moderate-intensity exercise).  Do strength-training exercises at least twice a week. Watch Your Levels of Cholesterol and Blood Lipids  Have your blood tested for lipids and cholesterol every 5 years starting at 60 years of age. If you are at high risk for heart disease, you should start having your blood tested when you are 60 years old. You may need to have your cholesterol levels checked more often if: ? Your lipid or cholesterol levels are high. ? You are older than 60 years of age. ? You are at high risk for heart disease. What should I know about cancer screening? Many types of cancers can be detected early and may often be prevented. Lung Cancer  You should be screened every year for lung cancer if: ? You are a current smoker who has smoked for at least 30 years. ? You are a former smoker who has quit within the past 15 years.  Talk to your health care provider about your screening options, when you should start screening, and how often you should be screened. Colorectal Cancer  Routine colorectal cancer screening usually begins at 60 years of age and should be repeated every 5-10 years until you are 60 years old. You may need to be screened more often if early forms of precancerous polyps or small growths are found. Your health care provider may  recommend screening at an earlier age if you have risk factors for colon cancer.  Your health care provider may recommend using home test kits to check for hidden blood in the stool.  A small camera at the end of a tube can be used to examine your colon (sigmoidoscopy or colonoscopy). This checks for the earliest forms of colorectal cancer. Prostate and Testicular Cancer  Depending on your age and overall health, your health care provider may do certain tests to screen for prostate and testicular cancer.  Talk to your health care provider about any symptoms or concerns you have about testicular or prostate cancer. Skin Cancer  Check your skin from head to toe regularly.  Tell your health care provider about any new moles or changes in moles, especially if: ? There is a change in a mole's size, shape, or color. ? You have a mole that is larger than a pencil eraser.  Always use sunscreen. Apply sunscreen liberally and repeat throughout the day.  Protect yourself by wearing long sleeves, pants, a wide-brimmed hat, and sunglasses when outside. What should I know about heart disease, diabetes, and high blood pressure?  If you are 18-39 years of age, have your blood pressure checked every 3-5 years. If you are 40 years of age or older, have your blood pressure checked every year. You should have your blood pressure measured twice-once when you are at a hospital or clinic, and once when you are not at a hospital   or clinic. Record the average of the two measurements. To check your blood pressure when you are not at a hospital or clinic, you can use: ? An automated blood pressure machine at a pharmacy. ? A home blood pressure monitor.  Talk to your health care provider about your target blood pressure.  If you are between 32-22 years old, ask your health care provider if you should take aspirin to prevent heart disease.  Have regular diabetes screenings by checking your fasting blood sugar  level. ? If you are at a normal weight and have a low risk for diabetes, have this test once every three years after the age of 45. ? If you are overweight and have a high risk for diabetes, consider being tested at a younger age or more often.  A one-time screening for abdominal aortic aneurysm (AAA) by ultrasound is recommended for men aged 64-75 years who are current or former smokers. What should I know about preventing infection? Hepatitis B If you have a higher risk for hepatitis B, you should be screened for this virus. Talk with your health care provider to find out if you are at risk for hepatitis B infection. Hepatitis C Blood testing is recommended for:  Everyone born from [birth year] through 1967.  Anyone with known risk factors for hepatitis C. Sexually Transmitted Diseases (STDs)  You should be screened each year for STDs including gonorrhea and chlamydia if: ? You are sexually active and are younger than 60 years of age. ? You are older than 60 years of age and your health care provider tells you that you are at risk for this type of infection. ? Your sexual activity has changed since you were last screened and you are at an increased risk for chlamydia or gonorrhea. Ask your health care provider if you are at risk.  Talk with your health care provider about whether you are at high risk of being infected with HIV. Your health care provider may recommend a prescription medicine to help prevent HIV infection. What else can I do?  Schedule regular health, dental, and eye exams.  Stay current with your vaccines (immunizations).  Do not use any tobacco products, such as cigarettes, chewing tobacco, and e-cigarettes. If you need help quitting, ask your health care provider.  Limit alcohol intake to no more than 2 drinks per day. One drink equals 12 ounces of beer, 5 ounces of wine, or 1 ounces of hard liquor.  Do not use street drugs.  Do not share needles.  Ask your health  care provider for help if you need support or information about quitting drugs.  Tell your health care provider if you often feel depressed.  Tell your health care provider if you have ever been abused or do not feel safe at home. This information is not intended to replace advice given to you by your health care provider. Make sure you discuss any questions you have with your health care provider. Document Released: 01/25/2008 Document Revised: 03/27/2016 Document Reviewed: 05/02/2015 Elsevier Interactive Patient Education  2019 [place] DASH stands for "Dietary Approaches to Stop Hypertension." The DASH eating plan is a healthy eating plan that has been shown to reduce high blood pressure (hypertension). It may also reduce your risk for type 2 diabetes, heart disease, and stroke. The DASH eating plan may also help with weight loss. What are tips for following this plan?  General guidelines  Avoid eating more than 2,300 mg (milligrams)  of salt (sodium) a day. If you have hypertension, you may need to reduce your sodium intake to 1,500 mg a day.  Limit alcohol intake to no more than 1 drink a day for nonpregnant women and 2 drinks a day for men. One drink equals 12 oz of beer, 5 oz of wine, or 1 oz of hard liquor.  Work with your health care provider to maintain a healthy body weight or to lose weight. Ask what an ideal weight is for you.  Get at least 30 minutes of exercise that causes your heart to beat faster (aerobic exercise) most days of the week. Activities may include walking, swimming, or biking.  Work with your health care provider or diet and nutrition specialist (dietitian) to adjust your eating plan to your individual calorie needs. Reading food labels   Check food labels for the amount of sodium per serving. Choose foods with less than 5 percent of the Daily Value of sodium. Generally, foods with less than 300 mg of sodium per serving fit into this  eating plan.  To find whole grains, look for the word "whole" as the first word in the ingredient list. Shopping  Buy products labeled as "low-sodium" or "no salt added."  Buy fresh foods. Avoid canned foods and premade or frozen meals. Cooking  Avoid adding salt when cooking. Use salt-free seasonings or herbs instead of table salt or sea salt. Check with your health care provider or pharmacist before using salt substitutes.  Do not fry foods. Cook foods using healthy methods such as baking, boiling, grilling, and broiling instead.  Cook with heart-healthy oils, such as olive, canola, soybean, or sunflower oil. Meal planning  Eat a balanced diet that includes: ? 5 or more servings of fruits and vegetables each day. At each meal, try to fill half of your plate with fruits and vegetables. ? Up to 6-8 servings of whole grains each day. ? Less than 6 oz of lean meat, poultry, or fish each day. A 3-oz serving of meat is about the same size as a deck of cards. One egg equals 1 oz. ? 2 servings of low-fat dairy each day. ? A serving of nuts, seeds, or beans 5 times each week. ? Heart-healthy fats. Healthy fats called Omega-3 fatty acids are found in foods such as flaxseeds and coldwater fish, like sardines, salmon, and mackerel.  Limit how much you eat of the following: ? Canned or prepackaged foods. ? Food that is high in trans fat, such as fried foods. ? Food that is high in saturated fat, such as fatty meat. ? Sweets, desserts, sugary drinks, and other foods with added sugar. ? Full-fat dairy products.  Do not salt foods before eating.  Try to eat at least 2 vegetarian meals each week.  Eat more home-cooked food and less restaurant, buffet, and fast food.  When eating at a restaurant, ask that your food be prepared with less salt or no salt, if possible. What foods are recommended? The items listed may not be a complete list. Talk with your dietitian about what dietary choices are  best for you. Grains Whole-grain or whole-wheat bread. Whole-grain or whole-wheat pasta. Brown rice. [place]. Bulgur. Whole-grain and low-sodium cereals. Pita bread. Low-fat, low-sodium crackers. Whole-wheat flour tortillas. Vegetables Fresh or frozen vegetables (raw, steamed, roasted, or grilled). Low-sodium or reduced-sodium tomato and vegetable juice. Low-sodium or reduced-sodium tomato sauce and tomato paste. Low-sodium or reduced-sodium canned vegetables. Fruits All fresh, dried, or frozen fruit. Canned  fruit in natural juice (without added sugar). Meat and other protein foods Skinless chicken or [place]. Ground chicken or [place]. Pork with fat trimmed off. Fish and seafood. Egg whites. Dried beans, peas, or lentils. Unsalted nuts, nut butters, and seeds. Unsalted canned beans. Lean cuts of beef with fat trimmed off. Low-sodium, lean deli meat. Dairy Low-fat (1%) or fat-free (skim) milk. Fat-free, low-fat, or reduced-fat cheeses. Nonfat, low-sodium ricotta or cottage cheese. Low-fat or nonfat yogurt. Low-fat, low-sodium cheese. Fats and oils Soft margarine without trans fats. Vegetable oil. Low-fat, reduced-fat, or light mayonnaise and salad dressings (reduced-sodium). Canola, safflower, olive, soybean, and sunflower oils. Avocado. Seasoning and other foods Herbs. Spices. Seasoning mixes without salt. Unsalted popcorn and pretzels. Fat-free sweets. What foods are not recommended? The items listed may not be a complete list. Talk with your dietitian about what dietary choices are best for you. Grains Baked goods made with fat, such as croissants, muffins, or some breads. Dry pasta or rice meal packs. Vegetables Creamed or fried vegetables. Vegetables in a cheese sauce. Regular canned vegetables (not low-sodium or reduced-sodium). Regular canned tomato sauce and paste (not low-sodium or reduced-sodium). Regular tomato and vegetable juice (not low-sodium or reduced-sodium). Angie Fava.  Olives. Fruits Canned fruit in a light or heavy syrup. Fried fruit. Fruit in cream or butter sauce. Meat and other protein foods Fatty cuts of meat. Ribs. Fried meat. Berniece Salines. Sausage. Bologna and other processed lunch meats. Salami. Fatback. Hotdogs. Bratwurst. Salted nuts and seeds. Canned beans with added salt. Canned or smoked fish. Whole eggs or egg yolks. Chicken or [place] with skin. Dairy Whole or 2% milk, cream, and half-and-half. Whole or full-fat cream cheese. Whole-fat or sweetened yogurt. Full-fat cheese. Nondairy creamers. Whipped toppings. Processed cheese and cheese spreads. Fats and oils Butter. Stick margarine. Lard. Shortening. Ghee. Bacon fat. Tropical oils, such as coconut, palm kernel, or palm oil. Seasoning and other foods Salted popcorn and pretzels. Onion salt, garlic salt, seasoned salt, table salt, and sea salt. Worcestershire sauce. Tartar sauce. Barbecue sauce. Teriyaki sauce. Soy sauce, including reduced-sodium. Steak sauce. Canned and packaged gravies. Fish sauce. Oyster sauce. Cocktail sauce. Horseradish that you find on the shelf. Ketchup. Mustard. Meat flavorings and tenderizers. Bouillon cubes. Hot sauce and Tabasco sauce. Premade or packaged marinades. Premade or packaged taco seasonings. Relishes. Regular salad dressings. Where to find more information:  National Heart, Lung, and [place]: https://wilson-eaton.com/  American Heart Association: www.heart.org Summary  The DASH eating plan is a healthy eating plan that has been shown to reduce high blood pressure (hypertension). It may also reduce your risk for type 2 diabetes, heart disease, and stroke.  With the DASH eating plan, you should limit salt (sodium) intake to 2,300 mg a day. If you have hypertension, you may need to reduce your sodium intake to 1,500 mg a day.  When on the DASH eating plan, aim to eat more fresh fruits and vegetables, whole grains, lean proteins, low-fat dairy, and heart-healthy  fats.  Work with your health care provider or diet and nutrition specialist (dietitian) to adjust your eating plan to your individual calorie needs. This information is not intended to replace advice given to you by your health care provider. Make sure you discuss any questions you have with your health care provider. Document Released: 07/18/2011 Document Revised: 07/22/2016 Document Reviewed: 07/22/2016 Elsevier Interactive Patient Education  2019 [place].  Dupuytren's Contracture Dupuytren's contracture is a condition in which tissue under the skin of the palm becomes thick. This causes one  or more of the fingers to curl inward (contract) toward the palm. After a while, the fingers may not be able to straighten out. This condition affects some or all of the fingers and the palm of the hand. This condition may affect one or both hands. Dupuytren's contracture is a long-term (chronic) condition that develops (progresses) slowly over time. There is no cure, but symptoms can be managed and progression can be slowed with treatment. This condition is usually not dangerous or painful, but it can interfere with everyday tasks. What are the causes?  This condition is caused by tissue (fascia) in the palm that gets thicker and tighter. When the fascia thickens, it pulls on the cords of tissue (tendons) that control finger movement. This causes the fingers to contract. The cause of fascia thickening is not known. However, the condition is often passed along from parent to child (inherited). What increases the risk? The following factors may make you more likely to develop this condition:  Being 37 years of age or older.  Being male.  Having a family history of this condition.  Using tobacco products, including cigarettes, chewing tobacco, and e-cigarettes.  Drinking alcohol excessively.  Having diabetes.  Having a seizure disorder. What are the signs or symptoms? Early symptoms of this  condition may include:  Thick, puckered skin on the hand.  One or more lumps (nodules) on the palm. Nodules may be tender when they first appear, but they are generally painless. Later symptoms of this condition may include:  Thick cords of tissue in the palm.  Fingers curled up toward the palm.  Inability to straighten the fingers into their normal position. Though this condition is usually painless, you may have discomfort when holding or grabbing objects. How is this diagnosed? This condition is diagnosed with a physical exam, which may include:  Looking at your hands and feeling your palms. This is to check for thickened fascia and nodules.  Measuring finger motion.  Doing the [place] tabletop test. You may be asked to try to put your hand on a surface, with your palm down and your fingers straight out. How is this treated? There is no cure for this condition, but treatment can relieve discomfort and make symptoms more manageable. Treatment options may include:  Physical therapy. This can strengthen your hand and increase flexibility.  Occupational therapy. This can help you with everyday tasks that may be more difficult because of your condition.  Shots (injections). Substances may be injected into your hand, such as: ? Medicines that help to decrease swelling (corticosteroids). ? Proteins (collagenase) to weaken thick tissue. After a collagenase injection, your health care provider may stretch your fingers.  Needle aponeurotomy. A needle is pushed through the skin and into the fascia. Moving the needle against the fascia can weaken or break up the thick tissue.  Surgery. This may be needed if your condition causes discomfort or interferes with everyday activities. Physical therapy is usually needed after surgery. No treatment is guaranteed to cure this condition. Recurrence of symptoms is common. Follow these instructions at home: Hand care  Take these actions to help  protect your hand from possible injury: ? Use tools that have padded grips. ? Wear protective gloves while you work with your hands. ? Avoid repetitive hand movements. General instructions  Take over-the-counter and prescription medicines only as told by your health care provider.  Manage any other conditions that you have, such as diabetes.  If physical therapy was prescribed, do exercises  as told by your health care provider.  Do not use any products that contain nicotine or tobacco, such as cigarettes, e-cigarettes, and chewing tobacco. If you need help quitting, ask your health care provider.  If you drink alcohol: ? Limit how much you use to:  0-1 drink a day for women.  0-2 drinks a day for men. ? Be aware of how much alcohol is in your drink. In the [place] one drink equals one 12 oz bottle of beer (355 mL), one 5 oz glass of wine (148 mL), or one 1 oz glass of hard liquor (44 mL).  Keep all follow-up visits as told by your health care provider. This is important. Contact a health care provider if:  You develop new symptoms, or your symptoms get worse.  You have pain that gets worse or does not get better with medicine.  You have difficulty or discomfort with everyday tasks.  You develop numbness or tingling. Get help right away if:  You have severe pain.  Your fingers change color or become unusually cold. Summary  Dupuytren's contracture is a condition in which tissue under the skin of the palm becomes thick.  This condition is caused by tissue (fascia) that thickens. When it thickens, it pulls on the cords of tissue (tendons) that control finger movement and makes the fingers to contract.  You are more likely to develop this condition if you are a man, are over 97 years of age, have a family history of the condition, and drink a lot of alcohol.  This condition can be treated with physical and occupational therapy, injections, and surgery.  Follow instructions  about how to care for your hand. Get help right away if you have severe pain or your fingers change color or become cold. This information is not intended to replace advice given to you by your health care provider. Make sure you discuss any questions you have with your health care provider. Document Released: 05/26/2009 Document Revised: 02/17/2018 Document Reviewed: 02/17/2018 Elsevier Interactive Patient Education  2019 [employer].

## 2018-10-08 NOTE — Progress Notes (Addendum)
Established Patient Office Visit  Subjective:  Patient ID: Jonathan Edwards, male    DOB: 08-15-1960  Age: 58 y.o. MRN: [id number]  CC:  Chief Complaint  Patient presents with  . Establish Care    HPI Jonathan Edwards presents for establishment of care by way of transfer, complete physical exam and follow-up of his hypertension, ED and callus formation on both of his palms.  Patient's blood pressures been well controlled on his current regimen.  He is having no issues with his medicines.  Uses Cialis as needed for ED with good result.  Recent surgery for retinal detachment that is been asymptomatic in OD.  This was found on routine ophthalmology will call exam. History reviewed. No pertinent past medical history.  History reviewed. No pertinent surgical history.  History reviewed. No pertinent family history.  Social History   Socioeconomic History  . Marital status: Married    Spouse name: Not on file  . Number of children: Not on file  . Years of education: Not on file  . Highest education level: Not on file  Occupational History  . Not on file  Social Needs  . Financial resource strain: Not on file  . Food insecurity:    Worry: Not on file    Inability: Not on file  . Transportation needs:    Medical: Not on file    Non-medical: Not on file  Tobacco Use  . Smoking status: Former Smoker    Last attempt to quit: 08/12/1998    Years since quitting: 20.1  . Smokeless tobacco: Never Used  Substance and Sexual Activity  . Alcohol use: No  . Drug use: No  . Sexual activity: Not on file  Lifestyle  . Physical activity:    Days per week: Not on file    Minutes per session: Not on file  . Stress: Not on file  Relationships  . Social connections:    Talks on phone: Not on file    Gets together: Not on file    Attends religious service: Not on file    Active member of club or organization: Not on file    Attends meetings of clubs or organizations: Not on file   Relationship status: Not on file  . Intimate partner violence:    Fear of current or ex partner: Not on file    Emotionally abused: Not on file    Physically abused: Not on file    Forced sexual activity: Not on file  Other Topics Concern  . Not on file  Social History Narrative  . Not on file    Outpatient Medications Prior to Visit  Medication Sig Dispense Refill  . ibuprofen (ADVIL,MOTRIN) 600 MG tablet Take 1 tablet (600 mg total) by mouth every 6 (six) hours as needed. 30 tablet 0  . felodipine (PLENDIL) 5 MG 24 hr tablet Take 1 tablet (5 mg total) by mouth daily. 90 tablet 1  . lisinopril-hydrochlorothiazide (PRINZIDE,ZESTORETIC) 20-12.5 MG tablet TAKE 1 TABLET BY MOUTH DAILY 90 tablet 1  . tadalafil (CIALIS) 10 MG tablet Take 1 tablet (10 mg total) by mouth daily as needed for erectile dysfunction. 30 tablet 3   No facility-administered medications prior to visit.     No Known Allergies  ROS Review of Systems  Constitutional: Negative.   HENT: Negative.   Eyes: Negative for photophobia and visual disturbance.  Respiratory: Negative.   Cardiovascular: Negative.   Gastrointestinal: Negative.   Endocrine: Negative for polyphagia and polyuria.  Genitourinary: Negative for difficulty urinating, frequency and urgency.  Musculoskeletal: Negative for gait problem and joint swelling.  Skin: Positive for rash.  Allergic/Immunologic: Negative for immunocompromised state.  Neurological: Negative for light-headedness and headaches.  Hematological: Does not bruise/bleed easily.  Psychiatric/Behavioral: Negative.       Objective:    Physical Exam  Constitutional: He is oriented to person, place, and time. He appears well-developed and well-nourished. No distress.  HENT:  Head: Normocephalic and atraumatic.  Right Ear: External ear normal.  Left Ear: External ear normal.  Mouth/Throat: Oropharynx is clear and moist. No oropharyngeal exudate.  Eyes: Pupils are equal, round,  and reactive to light. Conjunctivae are normal. Right eye exhibits no discharge. Left eye exhibits no discharge. No scleral icterus.  Neck: Neck supple. No JVD present. No tracheal deviation present. No thyromegaly present.  Cardiovascular: Normal rate, regular rhythm and normal heart sounds.  Pulmonary/Chest: Effort normal and breath sounds normal. No stridor.  Abdominal: Bowel sounds are normal. He exhibits no distension. There is no abdominal tenderness. There is no rebound and no guarding. A hernia is present. Hernia confirmed positive in the ventral area.  Genitourinary: Rectum:     Guaiac result negative.     No rectal mass, anal fissure, tenderness, external hemorrhoid, internal hemorrhoid or abnormal anal tone.  Prostate is enlarged. Prostate is not tender.  Musculoskeletal:        General: No edema.  Lymphadenopathy:    He has no cervical adenopathy.  Neurological: He is alert and oriented to person, place, and time.  Skin: Skin is warm and dry. He is not diaphoretic.     Psychiatric: He has a normal mood and affect.    BP 130/80   Pulse 60   Ht 6\' 1"  (1.854 m)   Wt 247 lb 8 oz (112.3 kg)   SpO2 96%   BMI 32.65 kg/m  Wt Readings from Last 3 Encounters:  10/08/18 247 lb 8 oz (112.3 kg)  07/15/17 240 lb (108.9 kg)  07/07/17 241 lb 12.8 oz (109.7 kg)   BP Readings from Last 3 Encounters:  10/08/18 130/80  07/15/17 124/90  07/07/17 128/84   Guideline developer:  UpToDate (see UpToDate for funding source) Date Released: June 2014  There are no preventive care reminders to display for this patient.  There are no preventive care reminders to display for this patient.  Lab Results  Component Value Date   TSH 1.63 07/07/2017   Lab Results  Component Value Date   WBC 7.3 10/09/2018   HGB 15.3 10/09/2018   HCT 46.6 10/09/2018   MCV 81.4 10/09/2018   PLT 210.0 10/09/2018   Lab Results  Component Value Date   NA 141 10/09/2018   K 4.1 10/09/2018   CO2 27  10/09/2018   GLUCOSE 95 10/09/2018   BUN 16 10/09/2018   CREATININE 1.08 10/09/2018   BILITOT 0.5 10/09/2018   ALKPHOS 43 10/09/2018   AST 24 10/09/2018   ALT 20 10/09/2018   PROT 6.3 10/09/2018   ALBUMIN 4.1 10/09/2018   CALCIUM 8.7 10/09/2018   GFR 84.56 10/09/2018   Lab Results  Component Value Date   CHOL 162 10/09/2018   Lab Results  Component Value Date   HDL 36.00 (L) 10/09/2018   Lab Results  Component Value Date   LDLCALC 102 (H) 10/09/2018   Lab Results  Component Value Date   TRIG 121.0 10/09/2018   Lab Results  Component Value Date   CHOLHDL 5 10/09/2018  No results found for: HGBA1C    Assessment & Plan:   Problem List Items Addressed This Visit      Cardiovascular and Mediastinum   Essential hypertension   Relevant Medications   felodipine (PLENDIL) 5 MG 24 hr tablet   lisinopril-hydrochlorothiazide (PRINZIDE,ZESTORETIC) 20-12.5 MG tablet   tadalafil (CIALIS) 10 MG tablet   Erectile dysfunction due to arterial insufficiency   Relevant Medications   felodipine (PLENDIL) 5 MG 24 hr tablet   lisinopril-hydrochlorothiazide (PRINZIDE,ZESTORETIC) 20-12.5 MG tablet   tadalafil (CIALIS) 10 MG tablet     Other   Dupuytren contracture   Relevant Orders   Ambulatory referral to [facility] maintenance - Primary   Relevant Orders   CBC (Completed)   Comprehensive metabolic panel (Completed)   Lipid panel (Completed)   PSA (Completed)   Urinalysis, Routine w reflex microscopic (Completed)   Increased prostate specific antigen (PSA) velocity      Meds ordered this encounter  Medications  . felodipine (PLENDIL) 5 MG 24 hr tablet    Sig: Take 1 tablet (5 mg total) by mouth daily.    Dispense:  90 tablet    Refill:  1  . lisinopril-hydrochlorothiazide (PRINZIDE,ZESTORETIC) 20-12.5 MG tablet    Sig: Take 1 tablet by mouth daily.    Dispense:  90 tablet    Refill:  1  . tadalafil (CIALIS) 10 MG tablet    Sig: Take 1 tablet (10  mg total) by mouth daily as needed for erectile dysfunction.    Dispense:  30 tablet    Refill:  3    Please give patient 90 day supply.    Follow-up: Return in about 6 months (around 04/08/2019).   Patient was given information on health maintenance and disease prevention as well as the DASH diet and Dupuytren's contracture.

## 2018-10-09 ENCOUNTER — Other Ambulatory Visit (INDEPENDENT_AMBULATORY_CARE_PROVIDER_SITE_OTHER): Payer: 59

## 2018-10-09 DIAGNOSIS — Z0000 Encounter for general adult medical examination without abnormal findings: Secondary | ICD-10-CM

## 2018-10-09 LAB — URINALYSIS, ROUTINE W REFLEX MICROSCOPIC
Bilirubin Urine: NEGATIVE
Hgb urine dipstick: NEGATIVE
Ketones, ur: NEGATIVE
Leukocytes,Ua: NEGATIVE
Nitrite: NEGATIVE
PH: 7.5 (ref 5.0–8.0)
RBC / HPF: NONE SEEN (ref 0–?)
Specific Gravity, Urine: 1.02 (ref 1.000–1.030)
TOTAL PROTEIN, URINE-UPE24: NEGATIVE
Urine Glucose: NEGATIVE
Urobilinogen, UA: 0.2 (ref 0.0–1.0)

## 2018-10-09 LAB — COMPREHENSIVE METABOLIC PANEL
ALK PHOS: 43 U/L (ref 39–117)
ALT: 20 U/L (ref 0–53)
AST: 24 U/L (ref 0–37)
Albumin: 4.1 g/dL (ref 3.5–5.2)
BILIRUBIN TOTAL: 0.5 mg/dL (ref 0.2–1.2)
BUN: 16 mg/dL (ref 6–23)
CALCIUM: 8.7 mg/dL (ref 8.4–10.5)
CO2: 27 mEq/L (ref 19–32)
Chloride: 107 mEq/L (ref 96–112)
Creatinine, Ser: 1.08 mg/dL (ref 0.40–1.50)
GFR: 84.56 mL/min (ref >60.00)
Glucose, Bld: 95 mg/dL (ref 70–99)
Potassium: 4.1 mEq/L (ref 3.5–5.1)
Sodium: 141 mEq/L (ref 135–145)
Total Protein: 6.3 g/dL (ref 6.0–8.3)

## 2018-10-09 LAB — CBC
HEMATOCRIT: 46.6 % (ref 39.0–52.0)
Hemoglobin: 15.3 g/dL (ref 13.0–17.0)
MCHC: 32.7 g/dL (ref 30.0–36.0)
MCV: 81.4 fl (ref 78.0–100.0)
Platelets: 210 10*3/uL (ref 150.0–400.0)
RBC: 5.73 Mil/uL (ref 4.22–5.81)
RDW: 14.3 % (ref 11.5–15.5)
WBC: 7.3 10*3/uL (ref 4.0–10.5)

## 2018-10-09 LAB — PSA: PSA: 4.19 ng/mL — ABNORMAL HIGH (ref 0.10–4.00)

## 2018-10-09 LAB — LIPID PANEL
CHOL/HDL RATIO: 5
Cholesterol: 162 mg/dL (ref 0–200)
HDL: 36 mg/dL — AB (ref >39.00)
LDL CALC: 102 mg/dL — AB (ref 0–99)
NonHDL: 126.42
TRIGLYCERIDES: 121 mg/dL (ref 0.0–149.0)
VLDL: 24.2 mg/dL (ref 0.0–40.0)

## 2018-10-12 DIAGNOSIS — R972 Elevated prostate specific antigen [PSA]: Secondary | ICD-10-CM | POA: Insufficient documentation

## 2018-10-14 ENCOUNTER — Ambulatory Visit (INDEPENDENT_AMBULATORY_CARE_PROVIDER_SITE_OTHER): Payer: 59 | Admitting: Family Medicine

## 2018-10-14 ENCOUNTER — Other Ambulatory Visit (INDEPENDENT_AMBULATORY_CARE_PROVIDER_SITE_OTHER): Payer: 59

## 2018-10-14 ENCOUNTER — Encounter: Payer: Self-pay | Admitting: Family Medicine

## 2018-10-14 ENCOUNTER — Ambulatory Visit: Payer: Self-pay

## 2018-10-14 VITALS — BP 100/80 | HR 102 | Ht 73.0 in | Wt 246.0 lb

## 2018-10-14 DIAGNOSIS — M2550 Pain in unspecified joint: Secondary | ICD-10-CM | POA: Diagnosis not present

## 2018-10-14 DIAGNOSIS — M79641 Pain in right hand: Secondary | ICD-10-CM | POA: Diagnosis not present

## 2018-10-14 DIAGNOSIS — M79642 Pain in left hand: Principal | ICD-10-CM

## 2018-10-14 DIAGNOSIS — M720 Palmar fascial fibromatosis [Dupuytren]: Secondary | ICD-10-CM | POA: Diagnosis not present

## 2018-10-14 LAB — URIC ACID: Uric Acid, Serum: 6.6 mg/dL (ref 4.0–7.8)

## 2018-10-14 LAB — TSH: TSH: 1.37 u[IU]/mL (ref 0.35–4.50)

## 2018-10-14 LAB — SEDIMENTATION RATE: Sed Rate: 8 mm/hr (ref 0–20)

## 2018-10-14 NOTE — Progress Notes (Signed)
Jonathan Edwards, [place] [address] Phone: [phone] Subjective:   I Jonathan Edwards am serving as a Education administrator for Dr. Hulan Saas.   CC: Bilateral hand exam  DXA:[id number]  Jonathan Edwards is a 58 y.o. male coming in with complaint of bilateral hand pain. States there are 3 knots in the palm of his hand. ROM is normal.  Does not remember any true injury.  More on the palm.  Has noticed that it can be a little sore if direct pressure in the area.  Onset- 7-8 months  Location- [place]  Character- achy  Aggravating factors- Severity-7 out of 10     No past medical history on file. No past surgical history on file. Social History   Socioeconomic History  . Marital status: Married    Spouse name: Not on file  . Number of children: Not on file  . Years of education: Not on file  . Highest education level: Not on file  Occupational History  . Not on file  Social Needs  . Financial resource strain: Not on file  . Food insecurity:    Worry: Not on file    Inability: Not on file  . Transportation needs:    Medical: Not on file    Non-medical: Not on file  Tobacco Use  . Smoking status: Former Smoker    Last attempt to quit: 08/12/1998    Years since quitting: 20.1  . Smokeless tobacco: Never Used  Substance and Sexual Activity  . Alcohol use: No  . Drug use: No  . Sexual activity: Not on file  Lifestyle  . Physical activity:    Days per week: Not on file    Minutes per session: Not on file  . Stress: Not on file  Relationships  . Social connections:    Talks on phone: Not on file    Gets together: Not on file    Attends religious service: Not on file    Active member of club or organization: Not on file    Attends meetings of clubs or organizations: Not on file    Relationship status: Not on file  Other Topics Concern  . Not on file  Social History Narrative  . Not on file   No Known Allergies No family history on  file.   Current Outpatient Medications (Cardiovascular):  .  felodipine (PLENDIL) 5 MG 24 hr tablet, Take 1 tablet (5 mg total) by mouth daily. Marland Kitchen  lisinopril-hydrochlorothiazide (PRINZIDE,ZESTORETIC) 20-12.5 MG tablet, Take 1 tablet by mouth daily. .  tadalafil (CIALIS) 10 MG tablet, Take 1 tablet (10 mg total) by mouth daily as needed for erectile dysfunction.   Current Outpatient Medications (Analgesics):  .  ibuprofen (ADVIL,MOTRIN) 600 MG tablet, Take 1 tablet (600 mg total) by mouth every 6 (six) hours as needed.      Past medical history, social, surgical and family history all reviewed in electronic medical record.  No pertanent information unless stated regarding to the chief complaint.   Review of Systems:  No headache, visual changes, nausea, vomiting, diarrhea, constipation, dizziness, abdominal pain, skin rash, fevers, chills, night sweats, weight loss, swollen lymph nodes, body aches, joint swelling, chest pain, shortness of breath, mood changes.  Positive muscle aches  Objective  Blood pressure 100/80, pulse (!) 102, height 6\' 1"  (1.854 m), weight 246 lb (111.6 kg), SpO2 96 %.   General: No apparent distress alert and oriented x3 mood and affect normal, dressed  appropriately.  HEENT: Pupils equal, extraocular movements intact  Respiratory: Patient's speak in full sentences and does not appear short of breath  Cardiovascular: No lower extremity edema, non tender, no erythema  Skin: Warm dry intact with no signs of infection or rash on extremities or on axial skeleton.  Abdomen: Soft nontender  Neuro: Cranial nerves II through XII are intact, neurovascularly intact in all extremities with 2+ DTRs and 2+ pulses.  Lymph: No lymphadenopathy of posterior or anterior cervical chain or axillae bilaterally.  Gait  Normal  MSK:  Non tender with full range of motion and good stability and symmetric strength and tone of shoulders, elbows,  hip, knee and ankles bilaterally.  Hand  exam shows the patient does have what appears to be puckering on the palmar aspect of the hand bilaterally right greater than left.  This is just proximal to the MP joint.  Minorly tender to palpation.  No foreign bodies felt.  Patient's flexor tendon appears to move appropriately.  Limited musculoskeletal ultrasound was performed and interpreted by Lyndal Pulley  Limited ultrasound of patient's flexor tendon sheath shows some mild calcific areas around the eye area of puckering.  Patient also has what appears to be soft tissue calcific deposits in the area corresponding with the superficial inspection.  No masses appreciated though.     Impression and Recommendations:     This case required medical decision making of moderate complexity. The above documentation has been reviewed and is accurate and complete Lyndal Pulley, DO       Note: This dictation was prepared with Dragon dictation along with smaller phrase technology. Any transcriptional errors that result from this process are unintentional.

## 2018-10-14 NOTE — Assessment & Plan Note (Signed)
Appears to have bilateral Dupuytren's contracture.  With bilateral contractures so need to rule out autoimmune disease that can be contributing.  Hypercalcemia could also be a potential cause.  Discussed warm compresses.  If worsening symptoms will consider injections but patient does not have any pain.  Patient will follow-up again in 4 to 6 weeks

## 2018-10-14 NOTE — Patient Instructions (Signed)
Good to see you man Likely Dupuytren's contracture.  Will get labs to make sure Warm compresses for 10 minutes then massage the area to try to break up the calcium  See me again in 4 weeks to make sure

## 2018-10-19 LAB — RHEUMATOID FACTOR: Rheumatoid fact SerPl-aCnc: <14 IU/mL (ref <14)

## 2018-10-19 LAB — VITAMIN D 1,25 DIHYDROXY
VITAMIN D2 1, 25 (OH): 9 pg/mL
Vitamin D 1, 25 (OH)2 Total: 41 pg/mL (ref 18–72)
Vitamin D3 1, 25 (OH)2: 32 pg/mL

## 2018-10-19 LAB — ANTI-SCLERODERMA ANTIBODY: Scleroderma (Scl-70) (ENA) Antibody, IgG: <1 AI

## 2018-10-19 LAB — PTH, INTACT AND CALCIUM
CALCIUM: 9.7 mg/dL (ref 8.6–10.3)
PTH: 13 pg/mL — ABNORMAL LOW (ref 14–64)

## 2018-10-19 LAB — CALCIUM, IONIZED: Calcium, Ion: 5.42 mg/dL (ref 4.8–5.6)

## 2018-10-19 LAB — ANGIOTENSIN CONVERTING ENZYME: Angiotensin-Converting Enzyme: 5 U/L — ABNORMAL LOW (ref 9–67)

## 2018-10-19 LAB — CYCLIC CITRUL PEPTIDE ANTIBODY, IGG: Cyclic Citrullin Peptide Ab: <16 UNITS

## 2018-10-19 LAB — ANA: Anti Nuclear Antibody(ANA): NEGATIVE

## 2018-11-10 NOTE — Progress Notes (Signed)
[name] [address] [place], [place] [address] Phone: [phone] Subjective:   I Jonathan Edwards am serving as a Education administrator for Dr. Hulan Saas.   CC: Hand pain follow-up  YHC:[id number]   10/14/2018 Appears to have bilateral Dupuytren's contracture.  With bilateral contractures so need to rule out autoimmune disease that can be contributing.  Hypercalcemia could also be a potential cause.  Discussed warm compresses.  If worsening symptoms will consider injections but patient does not have any pain.  Patient will follow-up again in 4 to 6   11/11/2018 Jonathan Edwards is a 58 y.o. male coming in with complaint of hand pain. States his hands are not as sore. Still has pain. Patient states that the pain that was improved.  Patient states it is no longer limiting him from any activities.  Feels like his grip strength has improved a little bit.  Patient did have a laboratory work-up that was unremarkable for any signs of autoimmune disease.    No past medical history on file. No past surgical history on file. Social History   Socioeconomic History  . Marital status: Married    Spouse name: Not on file  . Number of children: Not on file  . Years of education: Not on file  . Highest education level: Not on file  Occupational History  . Not on file  Social Needs  . Financial resource strain: Not on file  . Food insecurity:    Worry: Not on file    Inability: Not on file  . Transportation needs:    Medical: Not on file    Non-medical: Not on file  Tobacco Use  . Smoking status: Former Smoker    Last attempt to quit: 08/12/1998    Years since quitting: 20.2  . Smokeless tobacco: Never Used  Substance and Sexual Activity  . Alcohol use: No  . Drug use: No  . Sexual activity: Not on file  Lifestyle  . Physical activity:    Days per week: Not on file    Minutes per session: Not on file  . Stress: Not on file  Relationships  . Social connections:   Talks on phone: Not on file    Gets together: Not on file    Attends religious service: Not on file    Active member of club or organization: Not on file    Attends meetings of clubs or organizations: Not on file    Relationship status: Not on file  Other Topics Concern  . Not on file  Social History Narrative  . Not on file   No Known Allergies No family history on file.  No family history of autoimmune   Current Outpatient Medications (Cardiovascular):  .  felodipine (PLENDIL) 5 MG 24 hr tablet, Take 1 tablet (5 mg total) by mouth daily. Marland Kitchen  lisinopril-hydrochlorothiazide (PRINZIDE,ZESTORETIC) 20-12.5 MG tablet, Take 1 tablet by mouth daily. .  tadalafil (CIALIS) 10 MG tablet, Take 1 tablet (10 mg total) by mouth daily as needed for erectile dysfunction.   Current Outpatient Medications (Analgesics):  .  ibuprofen (ADVIL,MOTRIN) 600 MG tablet, Take 1 tablet (600 mg total) by mouth every 6 (six) hours as needed.      Past medical history, social, surgical and family history all reviewed in electronic medical record.  No pertanent information unless stated regarding to the chief complaint.   Review of Systems:  No headache, visual changes, nausea, vomiting, diarrhea, constipation, dizziness, abdominal pain, skin rash,  fevers, chills, night sweats, weight loss, swollen lymph nodes, body aches, joint swelling,  chest pain, shortness of breath, mood changes.  Positive muscle aches  Objective  Blood pressure (!) 144/80, pulse 73, height 6\' 1"  (1.854 m), weight 244 lb (110.7 kg), SpO2 97 %.    General: No apparent distress alert and oriented x3 mood and affect normal, dressed appropriately.  HEENT: Pupils equal, extraocular movements intact  Respiratory: Patient's speak in full sentences and does not appear short of breath  Cardiovascular: No lower extremity edema, non tender, no erythema  Skin: Warm dry intact with no signs of infection or rash on extremities or on axial skeleton.   Abdomen: Soft nontender  Neuro: Cranial nerves II through XII are intact, neurovascularly intact in all extremities with 2+ DTRs and 2+ pulses.  Lymph: No lymphadenopathy of posterior or anterior cervical chain or axillae bilaterally.  Gait normal with good balance and coordination.  MSK:  Non tender with full range of motion and good stability and symmetric strength and tone of shoulders, elbows, wrist, hip, knee and ankles bilaterally.  Hand exam still indentation   bilaterally.  Over the fourth fingers.  Minimal tender to palpation.  Otherwise unremarkable    Impression and Recommendations:     . The above documentation has been reviewed and is accurate and complete Lyndal Pulley, DO       Note: This dictation was prepared with Dragon dictation along with smaller phrase technology. Any transcriptional errors that result from this process are unintentional.

## 2018-11-11 ENCOUNTER — Other Ambulatory Visit: Payer: Self-pay

## 2018-11-11 ENCOUNTER — Ambulatory Visit: Payer: Self-pay

## 2018-11-11 ENCOUNTER — Ambulatory Visit (INDEPENDENT_AMBULATORY_CARE_PROVIDER_SITE_OTHER): Payer: 59 | Admitting: Family Medicine

## 2018-11-11 ENCOUNTER — Encounter: Payer: Self-pay | Admitting: Family Medicine

## 2018-11-11 VITALS — BP 144/80 | HR 73 | Ht 73.0 in | Wt 244.0 lb

## 2018-11-11 DIAGNOSIS — M79641 Pain in right hand: Secondary | ICD-10-CM | POA: Diagnosis not present

## 2018-11-11 DIAGNOSIS — M720 Palmar fascial fibromatosis [Dupuytren]: Secondary | ICD-10-CM | POA: Diagnosis not present

## 2018-11-11 DIAGNOSIS — M79642 Pain in left hand: Secondary | ICD-10-CM | POA: Diagnosis not present

## 2018-11-11 NOTE — Assessment & Plan Note (Signed)
Diffusion contracture bilaterally.  Patient is no longer having any pain associated with him at this moment.  All laboratory work-up was remarkably normal at the moment.  Discussed with patient about icing regimen, monitoring for symptoms and if needed we will consider injections at a later date.  As long as patient is well follow-up as needed.

## 2018-11-11 NOTE — Patient Instructions (Signed)
Great to see you  We injected the areas today

## 2019-03-14 ENCOUNTER — Other Ambulatory Visit: Payer: Self-pay | Admitting: Family Medicine

## 2019-03-14 DIAGNOSIS — I10 Essential (primary) hypertension: Secondary | ICD-10-CM

## 2019-07-11 DIAGNOSIS — Z20828 Contact with and (suspected) exposure to other viral communicable diseases: Secondary | ICD-10-CM | POA: Diagnosis not present

## 2019-08-30 ENCOUNTER — Telehealth: Payer: Self-pay | Admitting: Family Medicine

## 2019-08-30 NOTE — Telephone Encounter (Signed)
Patient is calling and requesting a RX refill for lisinopril and cialis. Patient would like Rx to be sent to [facility] on [address] and pt is requesting a 90 day supply. Please call patient once RX has been sent to pharmacy.

## 2019-08-31 ENCOUNTER — Other Ambulatory Visit: Payer: Self-pay

## 2019-08-31 DIAGNOSIS — I10 Essential (primary) hypertension: Secondary | ICD-10-CM

## 2019-08-31 DIAGNOSIS — N5201 Erectile dysfunction due to arterial insufficiency: Secondary | ICD-10-CM

## 2019-08-31 MED ORDER — TADALAFIL 10 MG PO TABS: 10.0000 mg | ORAL_TABLET | Freq: Every day | ORAL | 3 refills | Status: DC | PRN

## 2019-08-31 MED ORDER — LISINOPRIL-HYDROCHLOROTHIAZIDE 20-12.5 MG PO TABS: 1.0000 | ORAL_TABLET | Freq: Every day | ORAL | 0 refills | Status: DC

## 2019-08-31 NOTE — Telephone Encounter (Signed)
Pt due for CPE last exam Feb. 2020, called [facility] and schedule appointment for refills and CPE.

## 2019-08-31 NOTE — Telephone Encounter (Signed)
Pt calling for a refill on lisinopril 20-12.5mg  and cialis 10mg . Appointment scheduled for pt to coming in for follow on medication. Rx for lisinopril/HCT sent in Prepared cialis and pended for your approval. Thanks!

## 2019-09-20 ENCOUNTER — Encounter: Payer: Self-pay | Admitting: Family Medicine

## 2019-09-20 ENCOUNTER — Other Ambulatory Visit: Payer: Self-pay

## 2019-09-20 ENCOUNTER — Ambulatory Visit (INDEPENDENT_AMBULATORY_CARE_PROVIDER_SITE_OTHER): Payer: Federal, State, Local not specified - PPO | Admitting: Family Medicine

## 2019-09-20 VITALS — BP 124/79 | HR 72 | Temp 96.7°F | Ht 73.0 in | Wt 244.0 lb

## 2019-09-20 DIAGNOSIS — R972 Elevated prostate specific antigen [PSA]: Secondary | ICD-10-CM

## 2019-09-20 DIAGNOSIS — Z0000 Encounter for general adult medical examination without abnormal findings: Secondary | ICD-10-CM

## 2019-09-20 DIAGNOSIS — I10 Essential (primary) hypertension: Secondary | ICD-10-CM | POA: Diagnosis not present

## 2019-09-20 DIAGNOSIS — N5201 Erectile dysfunction due to arterial insufficiency: Secondary | ICD-10-CM

## 2019-09-20 LAB — COMPREHENSIVE METABOLIC PANEL
ALT: 25 U/L (ref 0–53)
AST: 23 U/L (ref 0–37)
Albumin: 3.9 g/dL (ref 3.5–5.2)
Alkaline Phosphatase: 49 U/L (ref 39–117)
BUN: 13 mg/dL (ref 6–23)
CO2: 29 mEq/L (ref 19–32)
Calcium: 9 mg/dL (ref 8.4–10.5)
Chloride: 105 mEq/L (ref 96–112)
Creatinine, Ser: 1.04 mg/dL (ref 0.40–1.50)
GFR: 88.04 mL/min (ref >60.00)
Glucose, Bld: 94 mg/dL (ref 70–99)
Potassium: 4.1 mEq/L (ref 3.5–5.1)
Sodium: 139 mEq/L (ref 135–145)
Total Bilirubin: 0.3 mg/dL (ref 0.2–1.2)
Total Protein: 6.3 g/dL (ref 6.0–8.3)

## 2019-09-20 LAB — LIPID PANEL
Cholesterol: 149 mg/dL (ref 0–200)
HDL: 37.5 mg/dL — ABNORMAL LOW (ref >39.00)
LDL Cholesterol: 82 mg/dL (ref 0–99)
NonHDL: 111.2
Total CHOL/HDL Ratio: 4
Triglycerides: 145 mg/dL (ref 0.0–149.0)
VLDL: 29 mg/dL (ref 0.0–40.0)

## 2019-09-20 LAB — URINALYSIS, ROUTINE W REFLEX MICROSCOPIC
Bilirubin Urine: NEGATIVE
Hgb urine dipstick: NEGATIVE
Ketones, ur: NEGATIVE
Leukocytes,Ua: NEGATIVE
Nitrite: NEGATIVE
RBC / HPF: NONE SEEN (ref 0–?)
Specific Gravity, Urine: 1.02 (ref 1.000–1.030)
Total Protein, Urine: NEGATIVE
Urine Glucose: NEGATIVE
Urobilinogen, UA: 0.2 (ref 0.0–1.0)
WBC, UA: NONE SEEN (ref 0–?)
pH: 6.5 (ref 5.0–8.0)

## 2019-09-20 LAB — PSA: PSA: 4.38 ng/mL — ABNORMAL HIGH (ref 0.10–4.00)

## 2019-09-20 LAB — CBC
HCT: 46.6 % (ref 39.0–52.0)
Hemoglobin: 15.3 g/dL (ref 13.0–17.0)
MCHC: 32.8 g/dL (ref 30.0–36.0)
MCV: 82 fl (ref 78.0–100.0)
Platelets: 197 10*3/uL (ref 150.0–400.0)
RBC: 5.69 Mil/uL (ref 4.22–5.81)
RDW: 14.3 % (ref 11.5–15.5)
WBC: 6.7 10*3/uL (ref 4.0–10.5)

## 2019-09-20 NOTE — Progress Notes (Addendum)
Established Patient Office Visit  Subjective:  Patient ID: Jonathan Edwards, male    DOB: [birth year]  Age: 59 y.o. MRN: [id number]  CC:  Chief Complaint  Patient presents with  . Annual Exam    pt here for CPE, no concerns.    HPI Jonathan Edwards presents for follow-up of his hypertension, elevated PSA and for complete physical exam.  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  Orthopedic doc asked him to discontinue the Plendil because he thought it may be contributing to the Dupuytren's contracture.  Pressure has been running in the 130s over 70s range on the lisinopril with HCTZ alone.  Urine flow has been good.  Denies difficulty urinating nocturia pre or post void dribbling.  Cialis continues to help with ED.  No dental care this past year.  It is time for repeat colonoscopy.  Continues to see the ophthalmologist regularly secondary to his relatively recent history of retinal detachment.  Quit smoking 20 years ago.  Rarely drinks alcohol.  Obtains 10,000 steps daily on his job.  History reviewed. No pertinent past medical history.  History reviewed. No pertinent surgical history.  History reviewed. No pertinent family history.  Social History   Socioeconomic History  . Marital status: Married    Spouse name: Not on file  . Number of children: Not on file  . Years of education: Not on file  . Highest education level: Not on file  Occupational History  . Not on file  Tobacco Use  . Smoking status: Former Smoker    Quit date: 08/12/1998    Years since quitting: 21.1  . Smokeless tobacco: Never Used  Substance and Sexual Activity  . Alcohol use: No  . Drug use: No  . Sexual activity: Not on file  Other Topics Concern  . Not on file  Social History Narrative  . Not on file   Social Determinants of Health   Financial Resource Strain:   . Difficulty of Paying Living Expenses: Not on file  Food Insecurity:   . Worried  About Charity fundraiser in the Last Year: Not on file  . Ran Out of Food in the Last Year: Not on file  Transportation Needs:   . Lack of Transportation (Medical): Not on file  . Lack of Transportation (Non-Medical): Not on file  Physical Activity:   . Days of Exercise per Week: Not on file  . Minutes of Exercise per Session: Not on file  Stress:   . Feeling of Stress : Not on file  Social Connections:   . Frequency of Communication with Friends and Family: Not on file  . Frequency of Social Gatherings with Friends and Family: Not on file  . Attends Religious Services: Not on file  . Active Member of Clubs or Organizations: Not on file  . Attends Archivist Meetings: Not on file  . Marital Status: Not on file  Intimate Partner Violence:   . Fear of Current or Ex-Partner: Not on file  . Emotionally Abused: Not on file  . Physically Abused: Not on file  . Sexually Abused: Not on file    Outpatient Medications Prior to Visit  Medication Sig Dispense Refill  . ibuprofen (ADVIL,MOTRIN) 600 MG tablet Take 1 tablet (600 mg total) by mouth every 6 (six) hours as needed. 30 tablet 0  . lisinopril-hydrochlorothiazide (ZESTORETIC) 20-12.5 MG tablet Take 1 tablet by mouth daily. [place]  tablet 0  . tadalafil (CIALIS) 10 MG tablet Take 1 tablet (10 mg total) by mouth daily as needed for erectile dysfunction. 30 tablet 3  . felodipine (PLENDIL) 5 MG 24 hr tablet Take 1 tablet (5 mg total) by mouth daily. (Patient not taking: Reported on 09/20/2019) 90 tablet 1   No facility-administered medications prior to visit.    No Known Allergies  ROS Review of Systems  Constitutional: Negative.   HENT: Negative.   Eyes: Negative for photophobia and redness.  Respiratory: Negative.   Cardiovascular: Negative.   Gastrointestinal: Negative.   Genitourinary: Negative for difficulty urinating, frequency and urgency.  Musculoskeletal: Negative for gait problem and joint swelling.  Skin: Negative  for pallor and rash.  Allergic/Immunologic: Negative for immunocompromised state.  Hematological: Does not bruise/bleed easily.  Psychiatric/Behavioral: Negative.       Objective:    Physical Exam  Constitutional: He is oriented to person, place, and time. He appears well-developed and well-nourished. No distress.  HENT:  Head: Normocephalic and atraumatic.  Right Ear: External ear normal.  Left Ear: External ear normal.  Eyes: Conjunctivae are normal. Right eye exhibits no discharge. Left eye exhibits no discharge. No scleral icterus.  Neck: No JVD present. No tracheal deviation present. No thyromegaly present.  Cardiovascular: Normal rate, regular rhythm and normal heart sounds.  Pulmonary/Chest: Effort normal and breath sounds normal. No stridor.  Abdominal: Soft. Bowel sounds are normal. A hernia is present. Hernia confirmed positive in the ventral area.  Genitourinary: Rectum:     Guaiac result negative.     No rectal mass, anal fissure, tenderness, external hemorrhoid, internal hemorrhoid or abnormal anal tone.  Prostate is enlarged. Prostate is not tender.  Musculoskeletal:        General: No edema.  Lymphadenopathy:    He has no cervical adenopathy.  Neurological: He is alert and oriented to person, place, and time.  Skin: Skin is warm and dry. He is not diaphoretic.  Psychiatric: He has a normal mood and affect.    BP 124/79   Pulse 72   Temp (!) 96.7 F (35.9 C) (Tympanic)   Ht 6\' 1"  (1.854 m)   Wt 244 lb (110.7 kg)   BMI 32.19 kg/m  Wt Readings from Last 3 Encounters:  09/20/19 244 lb (110.7 kg)  11/11/18 244 lb (110.7 kg)  10/14/18 246 lb (111.6 kg)     There are no preventive care reminders to display for this patient.  There are no preventive care reminders to display for this patient.  Lab Results  Component Value Date   TSH 1.37 10/14/2018   Lab Results  Component Value Date   WBC 6.7 09/20/2019   HGB 15.3 09/20/2019   HCT 46.6 09/20/2019    MCV 82.0 09/20/2019   PLT 197.0 09/20/2019   Lab Results  Component Value Date   NA 139 09/20/2019   K 4.1 09/20/2019   CO2 29 09/20/2019   GLUCOSE 94 09/20/2019   BUN 13 09/20/2019   CREATININE 1.04 09/20/2019   BILITOT 0.3 09/20/2019   ALKPHOS 49 09/20/2019   AST 23 09/20/2019   ALT 25 09/20/2019   PROT 6.3 09/20/2019   ALBUMIN 3.9 09/20/2019   CALCIUM 9.0 09/20/2019   GFR 88.04 09/20/2019   Lab Results  Component Value Date   CHOL 149 09/20/2019   Lab Results  Component Value Date   HDL 37.50 (L) 09/20/2019   Lab Results  Component Value Date   LDLCALC 82 09/20/2019  Lab Results  Component Value Date   TRIG 145.0 09/20/2019   Lab Results  Component Value Date   CHOLHDL 4 09/20/2019   No results found for: HGBA1C    Assessment & Plan:   Problem List Items Addressed This Visit      Cardiovascular and Mediastinum   Essential hypertension - Primary   Relevant Orders   CBC (Completed)   Comprehensive metabolic panel (Completed)   Urinalysis, Routine w reflex microscopic (Completed)   Erectile dysfunction due to arterial insufficiency     Other   Healthcare maintenance   Relevant Orders   Lipid panel (Completed)   Ambulatory referral to Gastroenterology   Increased prostate specific antigen (PSA) velocity   Relevant Orders   PSA (Completed)   Ambulatory referral to Urology      No orders of the defined types were placed in this encounter.   Follow-up: Return in about 6 months (around 03/19/2020).  Patient given information on health maintenance and disease prevention.  Advised him to find a dentist and seek regular dental care.  Urology referral with PSA velocity.  Libby Maw, MD

## 2019-09-20 NOTE — Patient Instructions (Signed)
Preventive Care 41-61 Years Old, Male Preventive care refers to lifestyle choices and visits with your health care provider that can promote health and wellness. This includes:  A yearly physical exam. This is also called an annual well check.  Regular dental and eye exams.  Immunizations.  Screening for certain conditions.  Healthy lifestyle choices, such as eating a healthy diet, getting regular exercise, not using drugs or products that contain nicotine and tobacco, and limiting alcohol use. What can I expect for my preventive care visit? Physical exam Your health care provider will check:  Height and weight. These may be used to calculate body mass index (BMI), which is a measurement that tells if you are at a healthy weight.  Heart rate and blood pressure.  Your skin for abnormal spots. Counseling Your health care provider may ask you questions about:  Alcohol, tobacco, and drug use.  Emotional well-being.  Home and relationship well-being.  Sexual activity.  Eating habits.  Work and work Statistician. What immunizations do I need?  Influenza (flu) vaccine  This is recommended every year. Tetanus, diphtheria, and pertussis (Tdap) vaccine  You may need a Td booster every 10 years. Varicella (chickenpox) vaccine  You may need this vaccine if you have not already been vaccinated. Zoster (shingles) vaccine  You may need this after age 61. Measles, mumps, and rubella (MMR) vaccine  You may need at least one dose of MMR if you were born in 1957 or later. You may also need a second dose. Pneumococcal conjugate (PCV13) vaccine  You may need this if you have certain conditions and were not previously vaccinated. Pneumococcal polysaccharide (PPSV23) vaccine  You may need one or two doses if you smoke cigarettes or if you have certain conditions. Meningococcal conjugate (MenACWY) vaccine  You may need this if you have certain conditions. Hepatitis A  vaccine  You may need this if you have certain conditions or if you travel or work in places where you may be exposed to hepatitis A. Hepatitis B vaccine  You may need this if you have certain conditions or if you travel or work in places where you may be exposed to hepatitis B. Haemophilus influenzae type b (Hib) vaccine  You may need this if you have certain risk factors. Human papillomavirus (HPV) vaccine  If recommended by your health care provider, you may need three doses over 6 months. You may receive vaccines as individual doses or as more than one vaccine together in one shot (combination vaccines). Talk with your health care provider about the risks and benefits of combination vaccines. What tests do I need? Blood tests  Lipid and cholesterol levels. These may be checked every 5 years, or more frequently if you are over 60 years old.  Hepatitis C test.  Hepatitis B test. Screening  Lung cancer screening. You may have this screening every year starting at age 61 if you have a 30-pack-year history of smoking and currently smoke or have quit within the past 15 years.  Prostate cancer screening. Recommendations will vary depending on your family history and other risks.  Colorectal cancer screening. All adults should have this screening starting at age 72 and continuing until age 2. Your health care provider may recommend screening at age 61 if you are at increased risk. You will have tests every 1-10 years, depending on your results and the type of screening test.  Diabetes screening. This is done by checking your blood sugar (glucose) after you have not eaten  for a while (fasting). You may have this done every 1-3 years.  Sexually transmitted disease (STD) testing. Follow these instructions at home: Eating and drinking  Eat a diet that includes fresh fruits and vegetables, whole grains, lean protein, and low-fat dairy products.  Take vitamin and mineral supplements as  recommended by your health care provider.  Do not drink alcohol if your health care provider tells you not to drink.  If you drink alcohol: ? Limit how much you have to 0-2 drinks a day. ? Be aware of how much alcohol is in your drink. In the [place] one drink equals one 12 oz bottle of beer (355 mL), one 5 oz glass of wine (148 mL), or one 1 oz glass of hard liquor (44 mL). Lifestyle  Take daily care of your teeth and gums.  Stay active. Exercise for at least 30 minutes on 5 or more days each week.  Do not use any products that contain nicotine or tobacco, such as cigarettes, e-cigarettes, and chewing tobacco. If you need help quitting, ask your health care provider.  If you are sexually active, practice safe sex. Use a condom or other form of protection to prevent STIs (sexually transmitted infections).  Talk with your health care provider about taking a low-dose aspirin every day starting at age 50. What's next?  Go to your health care provider once a year for a well check visit.  Ask your health care provider how often you should have your eyes and teeth checked.  Stay up to date on all vaccines. This information is not intended to replace advice given to you by your health care provider. Make sure you discuss any questions you have with your health care provider. Document Revised: 07/23/2018 Document Reviewed: 07/23/2018 Elsevier Patient Education  2020 [employer]  Health Maintenance, Male Adopting a healthy lifestyle and getting preventive care are important in promoting health and wellness. Ask your health care provider about:  The right schedule for you to have regular tests and exams.  Things you can do on your own to prevent diseases and keep yourself healthy. What should I know about diet, weight, and exercise? Eat a healthy diet   Eat a diet that includes plenty of vegetables, fruits, low-fat dairy products, and lean protein.  Do not eat a lot of foods that are  high in solid fats, added sugars, or sodium. Maintain a healthy weight Body mass index (BMI) is a measurement that can be used to identify possible weight problems. It estimates body fat based on height and weight. Your health care provider can help determine your BMI and help you achieve or maintain a healthy weight. Get regular exercise Get regular exercise. This is one of the most important things you can do for your health. Most adults should:  Exercise for at least 150 minutes each week. The exercise should increase your heart rate and make you sweat (moderate-intensity exercise).  Do strengthening exercises at least twice a week. This is in addition to the moderate-intensity exercise.  Spend less time sitting. Even light physical activity can be beneficial. Watch cholesterol and blood lipids Have your blood tested for lipids and cholesterol at 61 years of age, then have this test every 5 years. You may need to have your cholesterol levels checked more often if:  Your lipid or cholesterol levels are high.  You are older than 61 years of age.  You are at high risk for heart disease. What should I know about   cancer screening? Many types of cancers can be detected early and may often be prevented. Depending on your health history and family history, you may need to have cancer screening at various ages. This may include screening for:  Colorectal cancer.  Prostate cancer.  Skin cancer.  Lung cancer. What should I know about heart disease, diabetes, and high blood pressure? Blood pressure and heart disease  High blood pressure causes heart disease and increases the risk of stroke. This is more likely to develop in people who have high blood pressure readings, are of African descent, or are overweight.  Talk with your health care provider about your target blood pressure readings.  Have your blood pressure checked: ? Every 3-5 years if you are 18-39 years of age. ? Every year  if you are 40 years old or older.  If you are between the ages of 65 and 75 and are a current or former smoker, ask your health care provider if you should have a one-time screening for abdominal aortic aneurysm (AAA). Diabetes Have regular diabetes screenings. This checks your fasting blood sugar level. Have the screening done:  Once every three years after age 45 if you are at a normal weight and have a low risk for diabetes.  More often and at a younger age if you are overweight or have a high risk for diabetes. What should I know about preventing infection? Hepatitis B If you have a higher risk for hepatitis B, you should be screened for this virus. Talk with your health care provider to find out if you are at risk for hepatitis B infection. Hepatitis C Blood testing is recommended for:  Everyone born from 1945 through 1965.  Anyone with known risk factors for hepatitis C. Sexually transmitted infections (STIs)  You should be screened each year for STIs, including gonorrhea and chlamydia, if: ? You are sexually active and are younger than 61 years of age. ? You are older than 61 years of age and your health care provider tells you that you are at risk for this type of infection. ? Your sexual activity has changed since you were last screened, and you are at increased risk for chlamydia or gonorrhea. Ask your health care provider if you are at risk.  Ask your health care provider about whether you are at high risk for HIV. Your health care provider may recommend a prescription medicine to help prevent HIV infection. If you choose to take medicine to prevent HIV, you should first get tested for HIV. You should then be tested every 3 months for as long as you are taking the medicine. Follow these instructions at home: Lifestyle  Do not use any products that contain nicotine or tobacco, such as cigarettes, e-cigarettes, and chewing tobacco. If you need help quitting, ask your health care  provider.  Do not use street drugs.  Do not share needles.  Ask your health care provider for help if you need support or information about quitting drugs. Alcohol use  Do not drink alcohol if your health care provider tells you not to drink.  If you drink alcohol: ? Limit how much you have to 0-2 drinks a day. ? Be aware of how much alcohol is in your drink. In the [place] one drink equals one 12 oz bottle of beer (355 mL), one 5 oz glass of wine (148 mL), or one 1 oz glass of hard liquor (44 mL). General instructions  Schedule regular health, dental, and eye exams.    Stay current with your vaccines.  Tell your health care provider if: ? You often feel depressed. ? You have ever been abused or do not feel safe at home. Summary  Adopting a healthy lifestyle and getting preventive care are important in promoting health and wellness.  Follow your health care provider's instructions about healthy diet, exercising, and getting tested or screened for diseases.  Follow your health care provider's instructions on monitoring your cholesterol and blood pressure. This information is not intended to replace advice given to you by your health care provider. Make sure you discuss any questions you have with your health care provider. Document Revised: 07/22/2018 Document Reviewed: 07/22/2018 Elsevier Patient Education  2020 [place] Antigen Test Why am I having this test? The prostate-specific antigen (PSA) test is a screening test for prostate cancer. It can identify early signs of prostate cancer, which may allow for more effective treatment. Your health care provider may recommend that you have a PSA test starting at age 24 or that you have one earlier or later, depending on your risk factors for prostate cancer. You may also have a PSA test:  To monitor treatment of prostate cancer.  To check whether prostate cancer has returned after treatment.  If you have  signs of other conditions that can affect PSA levels, such as: ? An enlarged prostate that is not caused by cancer (benign prostatic hyperplasia, BPH). This condition is very common in older men. ? A prostate infection. What is being tested? This test measures the amount of PSA in your blood. PSA is a protein that is made in the prostate. The prostate naturally produces more PSA as you age, but very high levels may be a sign of a medical condition. What kind of sample is taken?  A blood sample is required for this test. It is usually collected by inserting a needle into a blood vessel or by sticking a finger with a small needle. Blood for this test should be drawn before having an exam of the prostate. How do I prepare for this test? Do not ejaculate starting 24 hours before your test, or as long as told by your health care provider. Tell a health care provider about:  Any allergies you have.  All medicines you are taking, including vitamins, herbs, eye drops, creams, and over-the-counter medicines. This also includes: ? Medicines to assist with hair growth, such as finasteride. ? Any recent exposure to a medicine called diethylstilbestrol.  Any blood disorders you have.  Any recent procedures you have had, especially any procedures involving the prostate or rectum.  Any medical conditions you have.  Any recent urinary tract infections (UTIs) you have had. How are the results reported? Your test results will be reported as a value that indicates how much PSA is in your blood. This will be given as nanograms of PSA per milliliter of blood (ng/mL). Your health care provider will compare your results to normal ranges that were established after testing a large group of people (reference ranges). Reference ranges may vary among labs and hospitals. PSA levels vary from person to person and generally increase with age. Because of this variation, there is no single PSA value that is considered  normal for everyone. Instead, PSA reference ranges are used to describe whether your PSA levels are considered low or high (elevated). Common reference ranges are:  Low: 0-2.5 ng/mL.  Slightly to moderately elevated: 2.6-10.0 ng/mL.  Moderately elevated: 10.0-19.9 ng/mL.  Significantly elevated: 20  ng/mL or greater. Sometimes, the test results may report that a condition is present when it is not present (false-positive result). What do the results mean? A test result that is higher than 4 ng/mL may mean that you are at an increased risk for prostate cancer. However, a PSA test by itself is not enough to diagnose prostate cancer. High PSA levels may also be caused by the natural aging process, prostate infection, or BPH. PSA screening cannot tell you if your PSA is high due to cancer or a different cause. A prostate biopsy is the only way to diagnose prostate cancer. A risk of having the PSA test is diagnosing and treating prostate cancer that would never have caused any symptoms or problems (overdiagnosis and overtreatment). Talk with your health care provider about what your results mean. Questions to ask your health care provider Ask your health care provider, or the department that is doing the test:  When will my results be ready?  How will I get my results?  What are my treatment options?  What other tests do I need?  What are my next steps? Summary  The prostate-specific antigen (PSA) test is a screening test for prostate cancer.  Your health care provider may recommend that you have a PSA test starting at age 38 or that you have one earlier or later, depending on your risk factors for prostate cancer.  A test result that is higher than 4 ng/mL may mean that you are at an increased risk for prostate cancer. However, elevated levels can be caused by a number of conditions other than prostate cancer.  Talk with your health care provider about what your results mean. This  information is not intended to replace advice given to you by your health care provider. Make sure you discuss any questions you have with your health care provider. Document Revised: 07/11/2017 Document Reviewed: 05/05/2017 Elsevier Patient Education  2020 [employer].

## 2019-09-21 NOTE — Addendum Note (Signed)
Addended by: Jon Billings on: 09/21/2019 07:56 AM   Modules accepted: Orders

## 2019-09-23 ENCOUNTER — Telehealth: Payer: Self-pay | Admitting: Family Medicine

## 2019-09-23 NOTE — Telephone Encounter (Signed)
Spoke with patient.

## 2019-09-23 NOTE — Telephone Encounter (Signed)
Patient is returning the call. CB is [phone]

## 2019-10-14 ENCOUNTER — Ambulatory Visit: Payer: Federal, State, Local not specified - PPO | Attending: Family

## 2019-10-14 DIAGNOSIS — Z23 Encounter for immunization: Secondary | ICD-10-CM | POA: Insufficient documentation

## 2019-10-14 NOTE — Progress Notes (Signed)
   Covid-19 Vaccination Clinic  Name:  Jonathan Edwards    MRN: [id number] DOB: [birth year]  10/14/2019  Mr. Betancourt was observed post Covid-19 immunization for 15 minutes without incident. He was provided with Vaccine Information Sheet and instruction to access the V-Safe system.   Mr. Frischman was instructed to call 911 with any severe reactions post vaccine: Marland Kitchen Difficulty breathing  . Swelling of face and throat  . A fast heartbeat  . A bad rash all over body  . Dizziness and weakness   Immunizations Administered    Name Date Dose VIS Date Route   Moderna COVID-19 Vaccine 10/14/2019 10:48 AM 0.5 mL 07/13/2019 Intramuscular   Manufacturer: Moderna   Lot: [id number]   [id number]

## 2019-11-16 ENCOUNTER — Ambulatory Visit: Payer: Federal, State, Local not specified - PPO | Attending: Family

## 2019-11-16 DIAGNOSIS — Z23 Encounter for immunization: Secondary | ICD-10-CM

## 2019-11-16 NOTE — Progress Notes (Signed)
   Covid-19 Vaccination Clinic  Name:  Jonathan Edwards    MRN: [id number] DOB: 09-28-1960  11/16/2019  Mr. Jonathan Edwards was observed post Covid-19 immunization for 15 minutes without incident. He was provided with Vaccine Information Sheet and instruction to access the V-Safe system.   Mr. Jonathan Edwards was instructed to call 911 with any severe reactions post vaccine: Marland Kitchen Difficulty breathing  . Swelling of face and throat  . A fast heartbeat  . A bad rash all over body  . Dizziness and weakness   Immunizations Administered    Name Date Dose VIS Date Route   Moderna COVID-19 Vaccine 11/16/2019 10:54 AM 0.5 mL 07/13/2019 Intramuscular   Manufacturer: Moderna   Lot: [id number]   [id number]

## 2019-11-24 DIAGNOSIS — H43811 Vitreous degeneration, right eye: Secondary | ICD-10-CM | POA: Diagnosis not present

## 2019-11-24 DIAGNOSIS — H35371 Puckering of macula, right eye: Secondary | ICD-10-CM | POA: Diagnosis not present

## 2019-11-24 DIAGNOSIS — H59811 Chorioretinal scars after surgery for detachment, right eye: Secondary | ICD-10-CM | POA: Diagnosis not present

## 2019-11-24 DIAGNOSIS — H31092 Other chorioretinal scars, left eye: Secondary | ICD-10-CM | POA: Diagnosis not present

## 2019-11-26 DIAGNOSIS — R972 Elevated prostate specific antigen [PSA]: Secondary | ICD-10-CM | POA: Diagnosis not present

## 2019-11-26 DIAGNOSIS — N400 Enlarged prostate without lower urinary tract symptoms: Secondary | ICD-10-CM | POA: Diagnosis not present

## 2019-11-28 ENCOUNTER — Other Ambulatory Visit: Payer: Self-pay | Admitting: Family Medicine

## 2019-11-28 DIAGNOSIS — I10 Essential (primary) hypertension: Secondary | ICD-10-CM

## 2019-12-30 DIAGNOSIS — C61 Malignant neoplasm of prostate: Secondary | ICD-10-CM | POA: Diagnosis not present

## 2019-12-30 DIAGNOSIS — R972 Elevated prostate specific antigen [PSA]: Secondary | ICD-10-CM | POA: Diagnosis not present

## 2020-01-13 DIAGNOSIS — C61 Malignant neoplasm of prostate: Secondary | ICD-10-CM | POA: Diagnosis not present

## 2020-01-20 ENCOUNTER — Other Ambulatory Visit: Payer: Self-pay | Admitting: Urology

## 2020-02-24 NOTE — Progress Notes (Addendum)
PCP - Abelino Derrick ,MD  lov 09-20-19 epic Cardiologist -   PPM/ICD -  Device Orders -  Rep Notified -   Chest x-ray -  EKG -  Stress Test -  ECHO -  Cardiac Cath -   Sleep Study -  CPAP -   Fasting Blood Sugar -  Checks Blood Sugar _____ times a day  Blood Thinner Instructions: Aspirin Instructions:  ERAS Protcol - PRE-SURGERY Ensure or G2-   COVID TEST- 7-22   Anesthesia review:   Patient denies shortness of breath, fever, cough and chest pain at PAT appointment   All instructions explained to the patient, with a verbal understanding of the material. Patient agrees to go over the instructions while at home for a better understanding. Patient also instructed to self quarantine after being tested for COVID-19. The opportunity to ask questions was provided.

## 2020-02-24 NOTE — Patient Instructions (Signed)
DUE TO COVID-19 ONLY ONE VISITOR IS ALLOWED TO COME WITH YOU AND STAY IN THE WAITING ROOM ONLY DURING PRE OP AND PROCEDURE DAY OF SURGERY. TWO VISITOR MAY VISIT WITH YOU AFTER SURGERY IN YOUR PRIVATE ROOM DURING VISITING HOURS ONLY!  10a-8p  YOU NEED TO HAVE A COVID 19 TEST ON_7-22-21______ @_______ , THIS TEST MUST BE DONE BEFORE SURGERY, COME  [address], [place] , [id number].  ([place]) ONCE YOUR COVID TEST IS COMPLETED, PLEASE BEGIN THE QUARANTINE INSTRUCTIONS AS OUTLINED IN YOUR HANDOUT.                Jonathan Edwards  02/24/2020   Your procedure is scheduled on: 03-06-20   Report to [facility] Main  Entrance   Report to admitting at      10:00  AM     Call this number if you have problems the morning of surgery [phone]    Remember: Do not eat food:After Midnight. You may have clear liquids until 0900 am then nothing by mouth    CLEAR LIQUID DIET   Foods Allowed                                                                                    Foods Excluded   Coffee and tea, regular and decaf no creamer                            liquids that you cannot  Plain Jell-O any favor except red or purple                                           see through such as: Fruit ices (not with fruit pulp)                                                            milk, soups, orange juice  Iced Popsicles                                                         All solid food Carbonated beverages, regular and diet                                    Cranberry, grape and apple juices Sports drinks like Gatorade Lightly seasoned clear broth or consume(fat free) Sugar, honey syrup  _____________________________________________________________________    BRUSH YOUR TEETH MORNING OF SURGERY AND RINSE YOUR MOUTH OUT, NO CHEWING GUM CANDY OR MINTS.     Take these medicines the morning of surgery with A SIP OF WATER:  tylenol if needed                                  You may not have any metal on your body including hair pins and              piercings  Do not wear jewelry,lotions, powders or perfumes, deodorant                   Men may shave face and neck.   Do not bring valuables to the hospital. [place].  Contacts, dentures or bridgework may not be worn into surgery.      Patients discharged the day of surgery will not be allowed to drive home. IF YOU ARE HAVING SURGERY AND GOING HOME THE SAME DAY, YOU MUST HAVE AN ADULT TO DRIVE YOU HOME AND BE WITH YOU FOR 24 HOURS. YOU MAY GO HOME BY TAXI OR UBER OR ORTHERWISE, BUT AN ADULT MUST ACCOMPANY YOU HOME AND STAY WITH YOU FOR 24 HOURS.  Name and phone number of your driver:  Special Instructions: N/A              Please read over the following fact sheets you were given: _____________________________________________________________________             [facility] - Preparing for Surgery Before surgery, you can play an important role.  Because skin is not sterile, your skin needs to be as free of germs as possible.  You can reduce the number of germs on your skin by washing with CHG (chlorahexidine gluconate) soap before surgery.  CHG is an antiseptic cleaner which kills germs and bonds with the skin to continue killing germs even after washing. Please DO NOT use if you have an allergy to CHG or antibacterial soaps.  If your skin becomes reddened/irritated stop using the CHG and inform your nurse when you arrive at Short Stay. Do not shave (including legs and underarms) for at least 48 hours prior to the first CHG shower.  You may shave your face/neck. Please follow these instructions carefully:  1.  Shower with CHG Soap the night before surgery and the  morning of Surgery.  2.  If you choose to wash your hair, wash your hair first as usual with your  normal  shampoo.  3.  After you shampoo, rinse your hair and body thoroughly to remove  the  shampoo.                           4.  Use CHG as you would any other liquid soap.  You can apply chg directly  to the skin and wash                       Gently with a scrungie or clean washcloth.  5.  Apply the CHG Soap to your body ONLY FROM THE NECK DOWN.   Do not use on face/ open                           Wound or open sores. Avoid contact with eyes, ears mouth and genitals (private parts).  Wash face,  Genitals (private parts) with your normal soap.             6.  Wash thoroughly, paying special attention to the area where your surgery  will be performed.  7.  Thoroughly rinse your body with warm water from the neck down.  8.  DO NOT shower/wash with your normal soap after using and rinsing off  the CHG Soap.                9.  Pat yourself dry with a clean towel.            10.  Wear clean pajamas.            11.  Place clean sheets on your bed the night of your first shower and do not  sleep with pets. Day of Surgery : Do not apply any lotions/deodorants the morning of surgery.  Please wear clean clothes to the hospital/surgery center.  FAILURE TO FOLLOW THESE INSTRUCTIONS MAY RESULT IN THE CANCELLATION OF YOUR SURGERY PATIENT SIGNATURE_________________________________  NURSE SIGNATURE__________________________________  ________________________________________________________________________  WHAT IS A BLOOD TRANSFUSION? Blood Transfusion Information  A transfusion is the replacement of blood or some of its parts. Blood is made up of multiple cells which provide different functions.  Red blood cells carry oxygen and are used for blood loss replacement.  White blood cells fight against infection.  Platelets control bleeding.  Plasma helps clot blood.  Other blood products are available for specialized needs, such as hemophilia or other clotting disorders. BEFORE THE TRANSFUSION  Who gives blood for transfusions?   Healthy volunteers who are  fully evaluated to make sure their blood is safe. This is blood bank blood. Transfusion therapy is the safest it has ever been in the practice of medicine. Before blood is taken from a donor, a complete history is taken to make sure that person has no history of diseases nor engages in risky social behavior (examples are intravenous drug use or sexual activity with multiple partners). The donor's travel history is screened to minimize risk of transmitting infections, such as malaria. The donated blood is tested for signs of infectious diseases, such as HIV and hepatitis. The blood is then tested to be sure it is compatible with you in order to minimize the chance of a transfusion reaction. If you or a relative donates blood, this is often done in anticipation of surgery and is not appropriate for emergency situations. It takes many days to process the donated blood. RISKS AND COMPLICATIONS Although transfusion therapy is very safe and saves many lives, the main dangers of transfusion include:   Getting an infectious disease.  Developing a transfusion reaction. This is an allergic reaction to something in the blood you were given. Every precaution is taken to prevent this. The decision to have a blood transfusion has been considered carefully by your caregiver before blood is given. Blood is not given unless the benefits outweigh the risks. AFTER THE TRANSFUSION  Right after receiving a blood transfusion, you will usually feel much better and more energetic. This is especially true if your red blood cells have gotten low (anemic). The transfusion raises the level of the red blood cells which carry oxygen, and this usually causes an energy increase.  The nurse administering the transfusion will monitor you carefully for complications. HOME CARE INSTRUCTIONS  No special instructions are needed after a transfusion. You may find your energy is better. Speak with your caregiver about any limitations  on  activity for underlying diseases you may have. SEEK MEDICAL CARE IF:   Your condition is not improving after your transfusion.  You develop redness or irritation at the intravenous (IV) site. SEEK IMMEDIATE MEDICAL CARE IF:  Any of the following symptoms occur over the next 12 hours:  Shaking chills.  You have a temperature by mouth above 102 F (38.9 C), not controlled by medicine.  Chest, back, or muscle pain.  People around you feel you are not acting correctly or are confused.  Shortness of breath or difficulty breathing.  Dizziness and fainting.  You get a rash or develop hives.  You have a decrease in urine output.  Your urine turns a dark color or changes to pink, red, or brown. Any of the following symptoms occur over the next 10 days:  You have a temperature by mouth above 102 F (38.9 C), not controlled by medicine.  Shortness of breath.  Weakness after normal activity.  The white part of the eye turns yellow (jaundice).  You have a decrease in the amount of urine or are urinating less often.  Your urine turns a dark color or changes to pink, red, or brown. Document Released: 07/26/2000 Document Revised: 10/21/2011 Document Reviewed: 03/14/2008 [facility] Patient Information 2014 [place], [place].  _______________________________________________________________________

## 2020-02-25 ENCOUNTER — Other Ambulatory Visit: Payer: Self-pay

## 2020-02-25 ENCOUNTER — Encounter (HOSPITAL_COMMUNITY)
Admission: RE | Admit: 2020-02-25 | Discharge: 2020-02-25 | Disposition: A | Discharge status: Home / Self Care | Payer: Federal, State, Local not specified - PPO | Source: Ambulatory Visit | Attending: Urology | Admitting: Urology

## 2020-02-25 ENCOUNTER — Other Ambulatory Visit: Payer: Self-pay | Admitting: Family Medicine

## 2020-02-25 ENCOUNTER — Encounter (HOSPITAL_COMMUNITY): Payer: Self-pay

## 2020-02-25 DIAGNOSIS — I10 Essential (primary) hypertension: Secondary | ICD-10-CM

## 2020-02-25 DIAGNOSIS — Z01818 Encounter for other preprocedural examination: Secondary | ICD-10-CM | POA: Diagnosis not present

## 2020-02-25 HISTORY — DX: Respiratory tuberculosis unspecified: A15.9

## 2020-02-25 HISTORY — DX: Pneumonia, unspecified organism: J18.9

## 2020-02-25 HISTORY — DX: Malignant (primary) neoplasm, unspecified: C80.1

## 2020-02-25 HISTORY — DX: Gastro-esophageal reflux disease without esophagitis: K21.9

## 2020-02-25 HISTORY — DX: Essential (primary) hypertension: I10

## 2020-02-25 LAB — BASIC METABOLIC PANEL
Anion gap: 12 (ref 5–15)
BUN: 12 mg/dL (ref 6–20)
CO2: 27 mmol/L (ref 22–32)
Calcium: 9 mg/dL (ref 8.9–10.3)
Chloride: 103 mmol/L (ref 98–111)
Creatinine, Ser: 1.03 mg/dL (ref 0.61–1.24)
GFR calc Af Amer: >60 mL/min (ref >60)
GFR calc non Af Amer: >60 mL/min (ref >60)
Glucose, Bld: 95 mg/dL (ref 70–99)
Potassium: 4.6 mmol/L (ref 3.5–5.1)
Sodium: 142 mmol/L (ref 135–145)

## 2020-02-25 LAB — PROTIME-INR
INR: 1 (ref 0.8–1.2)
Prothrombin Time: 13.1 seconds (ref 11.4–15.2)

## 2020-02-25 LAB — CBC
HCT: 45.2 % (ref 39.0–52.0)
Hemoglobin: 14.4 g/dL (ref 13.0–17.0)
MCH: 26.6 pg (ref 26.0–34.0)
MCHC: 31.9 g/dL (ref 30.0–36.0)
MCV: 83.5 fL (ref 80.0–100.0)
Platelets: 195 10*3/uL (ref 150–400)
RBC: 5.41 MIL/uL (ref 4.22–5.81)
RDW: 14.6 % (ref 11.5–15.5)
WBC: 8.1 10*3/uL (ref 4.0–10.5)
nRBC: 0 % (ref 0.0–0.2)

## 2020-03-01 DIAGNOSIS — M6281 Muscle weakness (generalized): Secondary | ICD-10-CM | POA: Diagnosis not present

## 2020-03-01 DIAGNOSIS — C61 Malignant neoplasm of prostate: Secondary | ICD-10-CM | POA: Diagnosis not present

## 2020-03-02 ENCOUNTER — Other Ambulatory Visit (HOSPITAL_COMMUNITY)
Admission: RE | Admit: 2020-03-02 | Discharge: 2020-03-02 | Disposition: A | Discharge status: Home / Self Care | Payer: Federal, State, Local not specified - PPO | Source: Ambulatory Visit | Attending: Urology | Admitting: Urology

## 2020-03-02 DIAGNOSIS — Z01812 Encounter for preprocedural laboratory examination: Secondary | ICD-10-CM | POA: Insufficient documentation

## 2020-03-02 DIAGNOSIS — Z20822 Contact with and (suspected) exposure to covid-19: Secondary | ICD-10-CM | POA: Insufficient documentation

## 2020-03-02 LAB — SARS CORONAVIRUS 2 (TAT 6-24 HRS): SARS Coronavirus 2: NEGATIVE

## 2020-03-06 ENCOUNTER — Inpatient Hospital Stay (HOSPITAL_COMMUNITY)
Admission: AD | Admit: 2020-03-06 | Discharge: 2020-03-10 | DRG: 707 | Disposition: A | Discharge status: Home / Self Care | Payer: Federal, State, Local not specified - PPO | Attending: Urology | Admitting: Urology

## 2020-03-06 ENCOUNTER — Other Ambulatory Visit: Payer: Self-pay

## 2020-03-06 ENCOUNTER — Ambulatory Visit (HOSPITAL_COMMUNITY): Payer: Federal, State, Local not specified - PPO | Admitting: Certified Registered"

## 2020-03-06 ENCOUNTER — Encounter (HOSPITAL_COMMUNITY)
Admission: AD | Disposition: A | Discharge status: Home / Self Care | Payer: Self-pay | Source: Home / Self Care | Attending: Urology

## 2020-03-06 ENCOUNTER — Encounter (HOSPITAL_COMMUNITY): Payer: Self-pay | Admitting: Urology

## 2020-03-06 DIAGNOSIS — N3289 Other specified disorders of bladder: Secondary | ICD-10-CM | POA: Diagnosis not present

## 2020-03-06 DIAGNOSIS — J309 Allergic rhinitis, unspecified: Secondary | ICD-10-CM | POA: Diagnosis not present

## 2020-03-06 DIAGNOSIS — I959 Hypotension, unspecified: Secondary | ICD-10-CM | POA: Diagnosis not present

## 2020-03-06 DIAGNOSIS — D62 Acute posthemorrhagic anemia: Secondary | ICD-10-CM | POA: Diagnosis not present

## 2020-03-06 DIAGNOSIS — Z87891 Personal history of nicotine dependence: Secondary | ICD-10-CM

## 2020-03-06 DIAGNOSIS — Z79899 Other long term (current) drug therapy: Secondary | ICD-10-CM | POA: Diagnosis not present

## 2020-03-06 DIAGNOSIS — C61 Malignant neoplasm of prostate: Secondary | ICD-10-CM | POA: Diagnosis not present

## 2020-03-06 DIAGNOSIS — I10 Essential (primary) hypertension: Secondary | ICD-10-CM | POA: Diagnosis present

## 2020-03-06 DIAGNOSIS — K219 Gastro-esophageal reflux disease without esophagitis: Secondary | ICD-10-CM | POA: Diagnosis not present

## 2020-03-06 DIAGNOSIS — Z20822 Contact with and (suspected) exposure to covid-19: Secondary | ICD-10-CM | POA: Diagnosis not present

## 2020-03-06 DIAGNOSIS — J209 Acute bronchitis, unspecified: Secondary | ICD-10-CM | POA: Diagnosis not present

## 2020-03-06 HISTORY — PX: ROBOT ASSISTED LAPAROSCOPIC RADICAL PROSTATECTOMY: SHX5141

## 2020-03-06 HISTORY — PX: PELVIC LYMPH NODE DISSECTION: SHX6543

## 2020-03-06 HISTORY — PX: LAPAROSCOPIC LYSIS OF ADHESIONS: SHX5905

## 2020-03-06 LAB — ABO/RH: ABO/RH(D): O POS

## 2020-03-06 LAB — HEMOGLOBIN AND HEMATOCRIT, BLOOD
HCT: 44.3 % (ref 39.0–52.0)
HCT: 40.8 % (ref 39.0–52.0)
Hemoglobin: 14.2 g/dL (ref 13.0–17.0)
Hemoglobin: 12.9 g/dL — ABNORMAL LOW (ref 13.0–17.0)

## 2020-03-06 SURGERY — PROSTATECTOMY, RADICAL, ROBOT-ASSISTED, LAPAROSCOPIC
Anesthesia: General

## 2020-03-06 MED ORDER — FENTANYL CITRATE (PF) 100 MCG/2ML IJ SOLN
INTRAMUSCULAR | Status: DC | PRN
  Administered 2020-03-06 (×6): 50 ug via INTRAVENOUS

## 2020-03-06 MED ORDER — ONDANSETRON HCL 4 MG/2ML IJ SOLN
INTRAMUSCULAR | Status: DC | PRN
  Administered 2020-03-06: 4 mg via INTRAVENOUS

## 2020-03-06 MED ORDER — SUGAMMADEX SODIUM 500 MG/5ML IV SOLN
INTRAVENOUS | Status: AC
  Filled 2020-03-06: qty 5

## 2020-03-06 MED ORDER — ACETAMINOPHEN 325 MG PO TABS: 325.0000 mg | ORAL_TABLET | Freq: Once | ORAL | Status: DC | PRN

## 2020-03-06 MED ORDER — SULFAMETHOXAZOLE-TRIMETHOPRIM 800-160 MG PO TABS
1.0000 | ORAL_TABLET | Freq: Two times a day (BID) | ORAL | 0 refills | Status: DC
Start: 2020-03-06 — End: 2020-03-15

## 2020-03-06 MED ORDER — ROCURONIUM BROMIDE 10 MG/ML (PF) SYRINGE
PREFILLED_SYRINGE | INTRAVENOUS | Status: DC | PRN
  Administered 2020-03-06 (×3): 20 mg via INTRAVENOUS
  Administered 2020-03-06: 60 mg via INTRAVENOUS
  Administered 2020-03-06: 10 mg via INTRAVENOUS
  Administered 2020-03-06: 20 mg via INTRAVENOUS
  Administered 2020-03-06: 10 mg via INTRAVENOUS

## 2020-03-06 MED ORDER — FENTANYL CITRATE (PF) 100 MCG/2ML IJ SOLN
INTRAMUSCULAR | Status: AC
  Filled 2020-03-06: qty 2

## 2020-03-06 MED ORDER — LISINOPRIL 20 MG PO TABS
20.0000 mg | ORAL_TABLET | Freq: Every day | ORAL | Status: DC
  Administered 2020-03-07 – 2020-03-10 (×3): 20 mg via ORAL
  Filled 2020-03-06 (×3): qty 1

## 2020-03-06 MED ORDER — HYDROMORPHONE HCL 1 MG/ML IJ SOLN
INTRAMUSCULAR | Status: AC
  Filled 2020-03-06: qty 1

## 2020-03-06 MED ORDER — PROPOFOL 10 MG/ML IV BOLUS
INTRAVENOUS | Status: DC | PRN
  Administered 2020-03-06: 150 mg via INTRAVENOUS

## 2020-03-06 MED ORDER — PREDNISOLONE ACETATE 1 % OP SUSP
1.0000 [drp] | Freq: Every day | OPHTHALMIC | Status: DC
  Administered 2020-03-07 – 2020-03-10 (×4): 1 [drp] via OPHTHALMIC
  Filled 2020-03-06 (×2): qty 5

## 2020-03-06 MED ORDER — ONDANSETRON HCL 4 MG/2ML IJ SOLN
INTRAMUSCULAR | Status: AC
  Filled 2020-03-06: qty 2

## 2020-03-06 MED ORDER — LACTATED RINGERS IV SOLN: INTRAVENOUS | Status: DC

## 2020-03-06 MED ORDER — ORAL CARE MOUTH RINSE: 15.0000 mL | Freq: Once | OROMUCOSAL | Status: AC

## 2020-03-06 MED ORDER — SODIUM CHLORIDE 0.9 % IV BOLUS
1000.0000 mL | Freq: Once | INTRAVENOUS | Status: AC
  Administered 2020-03-06: 1000 mL via INTRAVENOUS

## 2020-03-06 MED ORDER — DEXAMETHASONE SODIUM PHOSPHATE 10 MG/ML IJ SOLN
INTRAMUSCULAR | Status: DC | PRN
  Administered 2020-03-06: 8 mg via INTRAVENOUS

## 2020-03-06 MED ORDER — BUPIVACAINE LIPOSOME 1.3 % IJ SUSP
20.0000 mL | Freq: Once | INTRAMUSCULAR | Status: AC
  Administered 2020-03-06: 20 mL
  Filled 2020-03-06: qty 20

## 2020-03-06 MED ORDER — STERILE WATER FOR IRRIGATION IR SOLN
Status: DC | PRN
  Administered 2020-03-06: 1000 mL

## 2020-03-06 MED ORDER — CHLORHEXIDINE GLUCONATE 0.12 % MT SOLN
15.0000 mL | Freq: Once | OROMUCOSAL | Status: AC
  Administered 2020-03-06: 15 mL via OROMUCOSAL

## 2020-03-06 MED ORDER — LACTATED RINGERS IR SOLN
Status: DC | PRN
  Administered 2020-03-06: 1000 mL

## 2020-03-06 MED ORDER — HYDROMORPHONE HCL 1 MG/ML IJ SOLN
0.2500 mg | INTRAMUSCULAR | Status: DC | PRN
  Administered 2020-03-06 (×2): 0.5 mg via INTRAVENOUS

## 2020-03-06 MED ORDER — LIDOCAINE 2% (20 MG/ML) 5 ML SYRINGE
INTRAMUSCULAR | Status: DC | PRN
  Administered 2020-03-06: 50 mg via INTRAVENOUS

## 2020-03-06 MED ORDER — ACETAMINOPHEN 325 MG PO TABS
650.0000 mg | ORAL_TABLET | ORAL | Status: DC | PRN
  Administered 2020-03-07 – 2020-03-08 (×2): 650 mg via ORAL
  Filled 2020-03-06 (×2): qty 2

## 2020-03-06 MED ORDER — MIDAZOLAM HCL 2 MG/2ML IJ SOLN
INTRAMUSCULAR | Status: AC
  Filled 2020-03-06: qty 2

## 2020-03-06 MED ORDER — ACETAMINOPHEN 160 MG/5ML PO SOLN: 325.0000 mg | Freq: Once | ORAL | Status: DC | PRN

## 2020-03-06 MED ORDER — SUGAMMADEX SODIUM 200 MG/2ML IV SOLN
INTRAVENOUS | Status: DC | PRN
  Administered 2020-03-06: 230 mg via INTRAVENOUS

## 2020-03-06 MED ORDER — SODIUM CHLORIDE (PF) 0.9 % IJ SOLN
INTRAMUSCULAR | Status: AC
  Filled 2020-03-06: qty 20

## 2020-03-06 MED ORDER — OXYCODONE HCL 5 MG PO TABS
5.0000 mg | ORAL_TABLET | ORAL | Status: DC | PRN
  Administered 2020-03-07 – 2020-03-10 (×2): 5 mg via ORAL
  Filled 2020-03-06 (×2): qty 1

## 2020-03-06 MED ORDER — LISINOPRIL-HYDROCHLOROTHIAZIDE 20-12.5 MG PO TABS: 1.0000 | ORAL_TABLET | Freq: Every day | ORAL | Status: DC

## 2020-03-06 MED ORDER — CEFAZOLIN SODIUM-DEXTROSE 2-4 GM/100ML-% IV SOLN
2.0000 g | INTRAVENOUS | Status: AC
  Administered 2020-03-06: 2 g via INTRAVENOUS
  Filled 2020-03-06: qty 100

## 2020-03-06 MED ORDER — HYDROMORPHONE HCL 1 MG/ML IJ SOLN
0.5000 mg | INTRAMUSCULAR | Status: DC | PRN
  Administered 2020-03-06: 1 mg via INTRAVENOUS
  Administered 2020-03-08: 0.5 mg via INTRAVENOUS
  Filled 2020-03-06 (×2): qty 1

## 2020-03-06 MED ORDER — BELLADONNA ALKALOIDS-OPIUM 16.2-60 MG RE SUPP
1.0000 | Freq: Four times a day (QID) | RECTAL | Status: DC | PRN
  Administered 2020-03-06 – 2020-03-07 (×2): 1 via RECTAL
  Filled 2020-03-06 (×2): qty 1

## 2020-03-06 MED ORDER — SODIUM CHLORIDE (PF) 0.9 % IJ SOLN
INTRAMUSCULAR | Status: DC | PRN
  Administered 2020-03-06: 20 mL

## 2020-03-06 MED ORDER — MEPERIDINE HCL 50 MG/ML IJ SOLN: 6.2500 mg | INTRAMUSCULAR | Status: DC | PRN

## 2020-03-06 MED ORDER — BACITRACIN-NEOMYCIN-POLYMYXIN 400-5-5000 EX OINT: 1.0000 "application " | TOPICAL_OINTMENT | Freq: Three times a day (TID) | CUTANEOUS | Status: DC | PRN

## 2020-03-06 MED ORDER — DEXTROSE-NACL 5-0.45 % IV SOLN: INTRAVENOUS | Status: DC

## 2020-03-06 MED ORDER — ROCURONIUM BROMIDE 10 MG/ML (PF) SYRINGE
PREFILLED_SYRINGE | INTRAVENOUS | Status: AC
  Filled 2020-03-06: qty 10

## 2020-03-06 MED ORDER — PROPOFOL 10 MG/ML IV BOLUS
INTRAVENOUS | Status: AC
  Filled 2020-03-06: qty 20

## 2020-03-06 MED ORDER — PROMETHAZINE HCL 25 MG/ML IJ SOLN: 6.2500 mg | INTRAMUSCULAR | Status: DC | PRN

## 2020-03-06 MED ORDER — ACETAMINOPHEN 10 MG/ML IV SOLN: 1000.0000 mg | Freq: Once | INTRAVENOUS | Status: DC | PRN

## 2020-03-06 MED ORDER — HYDROCHLOROTHIAZIDE 12.5 MG PO CAPS
12.5000 mg | ORAL_CAPSULE | Freq: Every day | ORAL | Status: DC
  Administered 2020-03-09 – 2020-03-10 (×2): 12.5 mg via ORAL
  Filled 2020-03-06 (×3): qty 1

## 2020-03-06 MED ORDER — LIDOCAINE 2% (20 MG/ML) 5 ML SYRINGE
INTRAMUSCULAR | Status: AC
  Filled 2020-03-06: qty 5

## 2020-03-06 MED ORDER — DIPHENHYDRAMINE HCL 50 MG/ML IJ SOLN: 12.5000 mg | Freq: Four times a day (QID) | INTRAMUSCULAR | Status: DC | PRN

## 2020-03-06 MED ORDER — ONDANSETRON HCL 4 MG/2ML IJ SOLN: 4.0000 mg | INTRAMUSCULAR | Status: DC | PRN

## 2020-03-06 MED ORDER — HYDROCODONE-ACETAMINOPHEN 5-325 MG PO TABS: 1.0000 | ORAL_TABLET | Freq: Four times a day (QID) | ORAL | 0 refills | Status: DC | PRN

## 2020-03-06 MED ORDER — DEXAMETHASONE SODIUM PHOSPHATE 10 MG/ML IJ SOLN
INTRAMUSCULAR | Status: AC
  Filled 2020-03-06: qty 1

## 2020-03-06 MED ORDER — DIPHENHYDRAMINE HCL 12.5 MG/5ML PO ELIX: 12.5000 mg | ORAL_SOLUTION | Freq: Four times a day (QID) | ORAL | Status: DC | PRN

## 2020-03-06 MED ORDER — DOCUSATE SODIUM 100 MG PO CAPS
100.0000 mg | ORAL_CAPSULE | Freq: Two times a day (BID) | ORAL | Status: DC
  Administered 2020-03-06 – 2020-03-10 (×8): 100 mg via ORAL
  Filled 2020-03-06 (×8): qty 1

## 2020-03-06 MED ORDER — MIDAZOLAM HCL 2 MG/2ML IJ SOLN
INTRAMUSCULAR | Status: DC | PRN
  Administered 2020-03-06: 2 mg via INTRAVENOUS

## 2020-03-06 SURGICAL SUPPLY — 66 items
ADH SKN CLS APL DERMABOND .7 (GAUZE/BANDAGES/DRESSINGS)
AGENT HMST KT MTR STRL THRMB (HEMOSTASIS)
APL ESCP 34 STRL LF DISP (HEMOSTASIS)
APL PRP STRL LF DISP 70% ISPRP (MISCELLANEOUS) ×2
APL SWBSTK 6 STRL LF DISP (MISCELLANEOUS) ×2
APPLICATOR COTTON TIP 6 STRL (MISCELLANEOUS) ×2 IMPLANT
APPLICATOR COTTON TIP 6IN STRL (MISCELLANEOUS) ×3
APPLICATOR SURGIFLO ENDO (HEMOSTASIS) IMPLANT
CATH FOLEY 2WAY SLVR  5CC 18FR (CATHETERS) ×3
CATH FOLEY 2WAY SLVR 5CC 18FR (CATHETERS) ×2 IMPLANT
CATH TIEMANN FOLEY 18FR 5CC (CATHETERS) ×3 IMPLANT
CHLORAPREP W/TINT 26 (MISCELLANEOUS) ×3 IMPLANT
CLIP VESOLOCK LG 6/CT PURPLE (CLIP) ×6 IMPLANT
COVER SURGICAL LIGHT HANDLE (MISCELLANEOUS) ×3 IMPLANT
COVER TIP SHEARS 8 DVNC (MISCELLANEOUS) ×2 IMPLANT
COVER TIP SHEARS 8MM DA VINCI (MISCELLANEOUS) ×3
COVER WAND RF STERILE (DRAPES) IMPLANT
CUTTER ECHEON FLEX ENDO 45 340 (ENDOMECHANICALS) ×3 IMPLANT
DECANTER SPIKE VIAL GLASS SM (MISCELLANEOUS) ×3 IMPLANT
DERMABOND ADVANCED (GAUZE/BANDAGES/DRESSINGS)
DERMABOND ADVANCED .7 DNX12 (GAUZE/BANDAGES/DRESSINGS) IMPLANT
DRAIN CHANNEL RND F F (WOUND CARE) IMPLANT
DRAPE ARM DVNC X/XI (DISPOSABLE) ×8 IMPLANT
DRAPE COLUMN DVNC XI (DISPOSABLE) ×2 IMPLANT
DRAPE DA VINCI XI ARM (DISPOSABLE) ×12
DRAPE DA VINCI XI COLUMN (DISPOSABLE) ×3
DRAPE SURG IRRIG POUCH 19X23 (DRAPES) ×3 IMPLANT
DRSG TEGADERM 4X4.75 (GAUZE/BANDAGES/DRESSINGS) ×3 IMPLANT
ELECT REM PT RETURN 15FT ADLT (MISCELLANEOUS) ×3 IMPLANT
GLOVE BIO SURGEON STRL SZ 6.5 (GLOVE) ×3 IMPLANT
GLOVE BIO SURGEON STRL SZ7.5 (GLOVE) ×6 IMPLANT
GOWN STRL REUS W/TWL LRG LVL3 (GOWN DISPOSABLE) ×3 IMPLANT
GOWN STRL REUS W/TWL XL LVL3 (GOWN DISPOSABLE) ×6 IMPLANT
HEMOSTAT SURGICEL 4X8 (HEMOSTASIS) IMPLANT
HOLDER FOLEY CATH W/STRAP (MISCELLANEOUS) ×3 IMPLANT
IRRIG SUCT STRYKERFLOW 2 WTIP (MISCELLANEOUS) ×3
IRRIGATION SUCT STRKRFLW 2 WTP (MISCELLANEOUS) ×2 IMPLANT
IV LACTATED RINGERS 1000ML (IV SOLUTION) ×3 IMPLANT
KIT TURNOVER KIT A (KITS) IMPLANT
NDL INSUFFLATION 14GA 120MM (NEEDLE) ×2 IMPLANT
NEEDLE INSUFFLATION 14GA 120MM (NEEDLE) ×3 IMPLANT
PACK ROBOT UROLOGY CUSTOM (CUSTOM PROCEDURE TRAY) ×3 IMPLANT
PAD POSITIONING PINK XL (MISCELLANEOUS) ×3 IMPLANT
PENCIL SMOKE EVACUATOR (MISCELLANEOUS) IMPLANT
RELOAD STAPLE 45 4.1 GRN THCK (STAPLE) ×2 IMPLANT
SCISSORS LAP 5X45 EPIX DISP (ENDOMECHANICALS) ×1 IMPLANT
SEAL CANN UNIV 5-8 DVNC XI (MISCELLANEOUS) ×8 IMPLANT
SEAL XI 5MM-8MM UNIVERSAL (MISCELLANEOUS) ×12
SET TUBE SMOKE EVAC HIGH FLOW (TUBING) ×3 IMPLANT
SOLUTION ELECTROLUBE (MISCELLANEOUS) ×3 IMPLANT
SPONGE LAP 4X18 RFD (DISPOSABLE) ×3 IMPLANT
STAPLE RELOAD 45 GRN (STAPLE) ×2 IMPLANT
STAPLE RELOAD 45MM GREEN (STAPLE) ×3
SURGIFLO W/THROMBIN 8M KIT (HEMOSTASIS) IMPLANT
SUT ETHILON 3 0 PS 1 (SUTURE) ×3 IMPLANT
SUT MNCRL AB 4-0 PS2 18 (SUTURE) ×6 IMPLANT
SUT PROLENE 1 CT 1 30 (SUTURE) ×1 IMPLANT
SUT VIC AB 0 UR5 27 (SUTURE) ×3 IMPLANT
SUT VIC AB 2-0 SH 27 (SUTURE) ×3
SUT VIC AB 2-0 SH 27X BRD (SUTURE) ×2 IMPLANT
SUT VICRYL 0 UR6 27IN ABS (SUTURE) ×6 IMPLANT
SUT VLOC BARB 180 ABS3/0GR12 (SUTURE) ×6
SUTURE VLOC BRB 180 ABS3/0GR12 (SUTURE) ×4 IMPLANT
TOWEL OR NON WOVEN STRL DISP B (DISPOSABLE) ×3 IMPLANT
TROCAR XCEL NON-BLD 5MMX100MML (ENDOMECHANICALS) IMPLANT
WATER STERILE IRR 1000ML POUR (IV SOLUTION) ×3 IMPLANT

## 2020-03-06 NOTE — Anesthesia Preprocedure Evaluation (Signed)
Anesthesia Evaluation  Patient identified by MRN, date of birth, ID band Patient awake    Reviewed: Allergy & Precautions, NPO status , Patient's Chart, lab work & pertinent test results  Airway Mallampati: II  TM Distance: >3 FB Neck ROM: Full    Dental  (+) Teeth Intact, Dental Advisory Given, Caps,    Pulmonary former smoker,    breath sounds clear to auscultation       Cardiovascular hypertension,  Rhythm:Regular Rate:Normal     Neuro/Psych negative neurological ROS  negative psych ROS   GI/Hepatic Neg liver ROS, GERD  ,  Endo/Other  negative endocrine ROS  Renal/GU negative Renal ROS     Musculoskeletal  (+) Arthritis ,   Abdominal Normal abdominal exam  (+)   Peds  Hematology negative hematology ROS (+)   Anesthesia Other Findings   Reproductive/Obstetrics                             Anesthesia Physical Anesthesia Plan  ASA: II  Anesthesia Plan: General   Post-op Pain Management:    Induction: Intravenous  PONV Risk Score and Plan: 3 and Ondansetron, Midazolam and Dexamethasone  Airway Management Planned: Oral ETT  Additional Equipment: None  Intra-op Plan:   Post-operative Plan: Extubation in OR  Informed Consent: I have reviewed the patients History and Physical, chart, labs and discussed the procedure including the risks, benefits and alternatives for the proposed anesthesia with the patient or authorized representative who has indicated his/her understanding and acceptance.     Dental advisory given  Plan Discussed with: CRNA  Anesthesia Plan Comments: (2 IV's)        Anesthesia Quick Evaluation

## 2020-03-06 NOTE — Progress Notes (Signed)
Patient had some light bleeding from port sites on arrival to unit.  Gauze placed over incisions with no issues.  Patient insistent on getting up to use BSC approx 2 hours post arrival to unit to have a bowel movement. Patient had bleeding at this time from foley catheter, JP site, and port sites.  Gauze with tape placed as a pressure dressing on port sites.  JP has put out 200 ml of sanguineous fluid since patient arrived to floor.  Patient VSS, and patient is alert and oriented at this time.  Dr. Jeffie Pollock on call, paged, new orders received to check patient's H&H now and at midnight.  Report given to night RN, will continue to monitor.

## 2020-03-06 NOTE — H&P (Signed)
H&P  Chief Complaint: Prostate cancer  History of Present Illness: 59 year old male recently diagnosed with prostate cancer presents for robotic assisted laparoscopic prostatectomy with bilateral pelvic lymph node dissection.  Past Medical History:  Diagnosis Date  . Cancer Jonathan Edwards)    prostate  . GERD (gastroesophageal reflux disease)   . Hypertension   . Pneumonia   . Tuberculosis    + ppd test  when 59 years old never had any symptoms   Past Surgical History:  Procedure Laterality Date  . EYE SURGERY     hole repair left eye and detached retina surgery  . HERNIA REPAIR     umbilical with mesh  . TONSILLECTOMY      Home Medications:  Medications Prior to Admission  Medication Sig Dispense Refill Last Dose  . acetaminophen (TYLENOL) 325 MG tablet Take 650 mg by mouth every 6 (six) hours as needed for moderate pain or headache.   03/06/2020 at Unknown time  . lisinopril-hydrochlorothiazide (ZESTORETIC) 20-12.5 MG tablet TAKE 1 TABLET BY MOUTH EVERY DAY 90 tablet 0 03/05/2020 at Unknown time  . Multiple Vitamin (MULTIVITAMIN WITH MINERALS) TABS tablet Take 1 tablet by mouth daily.   Past Week at Unknown time  . naproxen sodium (ALEVE) 220 MG tablet Take 440 mg by mouth daily as needed (pain).   Past Week at Unknown time  . Omega-3 Fatty Acids (OMEGA 3 PO) Take 1 capsule by mouth daily.   Past Week at Unknown time  . prednisoLONE acetate (PRED FORTE) 1 % ophthalmic suspension Place 1 drop into the left eye daily.   Past Week at Unknown time  . Probiotic CAPS Take 1 capsule by mouth daily.   Past Week at Unknown time  . VITAMIN D PO Take 1 capsule by mouth daily.   Past Week at Unknown time  . cyclobenzaprine (FLEXERIL) 10 MG tablet Take 10 mg by mouth 3 (three) times daily as needed for muscle spasms.   More than a month at Unknown time  . tadalafil (CIALIS) 10 MG tablet Take 1 tablet (10 mg total) by mouth daily as needed for erectile dysfunction. 30 tablet 3 More than a month at  Unknown time   Allergies: No Known Allergies  History reviewed. No pertinent family history. Social History:  reports that he quit smoking about 21 years ago. He quit after 30.00 years of use. He has never used smokeless tobacco. He reports current alcohol use. He reports that he does not use drugs.  ROS: A complete review of systems was performed.  All systems are negative except for pertinent findings as noted. ROS   Physical Exam:  Vital signs in last 24 hours: Temp:  [98 F (36.7 C)] 98 F (36.7 C) (07/26 1027) Pulse Rate:  [77] 77 (07/26 1027) Resp:  [17] 17 (07/26 1027) BP: (156)/(109) 156/109 (07/26 1027) SpO2:  [98 %] 98 % (07/26 1027) Weight:  [111.6 kg] 111.6 kg (07/26 1037) General:  Alert and oriented, No acute distress HEENT: Normocephalic, atraumatic Neck: No JVD or lymphadenopathy Cardiovascular: Regular rate and rhythm Lungs: Regular rate and effort Abdomen: Soft, nontender, nondistended, no abdominal masses Back: No CVA tenderness Extremities: No edema Neurologic: Grossly intact  Laboratory Data:  No results found for this or any previous visit (from the past 24 hour(s)). Recent Results (from the past 240 hour(s))  SARS CORONAVIRUS 2 (TAT 6-24 HRS) Nasopharyngeal Nasopharyngeal Swab     Status: None   Collection Time: 03/02/20 11:23 AM   Specimen: Nasopharyngeal Swab  Result Value Ref Range Status   SARS Coronavirus 2 NEGATIVE NEGATIVE Final    Comment: (NOTE) SARS-CoV-2 target nucleic acids are NOT DETECTED.  The SARS-CoV-2 RNA is generally detectable in upper and lower respiratory specimens during the acute phase of infection. Negative results do not preclude SARS-CoV-2 infection, do not rule out co-infections with other pathogens, and should not be used as the sole basis for treatment or other patient management decisions. Negative results must be combined with clinical observations, patient history, and epidemiological information. The  expected result is Negative.  Fact Sheet for Patients: SugarRoll.be  Fact Sheet for Healthcare Providers: https://www.woods-mathews.com/  This test is not yet approved or cleared by the [place] FDA and  has been authorized for detection and/or diagnosis of SARS-CoV-2 by FDA under an Emergency Use Authorization (EUA). This EUA will remain  in effect (meaning this test can be used) for the duration of the COVID-19 declaration under Se ction 564(b)(1) of the Act, 21 U.S.C. section 360bbb-3(b)(1), unless the authorization is terminated or revoked sooner.  Performed at [facility], [place] [address] [place], [place] [address]    Creatinine: No results for input(s): CREATININE in the last 168 hours.  Impression/Assessment:  Prostate cancer  Plan:  Proceed with robotic assisted laparoscopic prostatectomy with bilateral pelvic lymph node dissection.  He understands the risk and benefits as outlined in my previous note.  Jonathan Edwards, Jonathan Edwards 03/06/2020, 10:40 AM

## 2020-03-06 NOTE — Discharge Instructions (Signed)

## 2020-03-06 NOTE — Anesthesia Procedure Notes (Signed)
Procedure Name: Intubation Date/Time: 03/06/2020 11:39 AM Performed by: Niel Hummer, CRNA Pre-anesthesia Checklist: Patient identified, Emergency Drugs available, Suction available and Patient being monitored Patient Re-evaluated:Patient Re-evaluated prior to induction Oxygen Delivery Method: Circle system utilized Preoxygenation: Pre-oxygenation with 100% oxygen Induction Type: IV induction Ventilation: Mask ventilation without difficulty and Oral airway inserted - appropriate to patient size Laryngoscope Size: Mac and 4 Grade View: Grade I Tube type: Oral Tube size: 7.5 mm Number of attempts: 1 Airway Equipment and Method: Stylet Placement Confirmation: ETT inserted through vocal cords under direct vision,  positive ETCO2 and breath sounds checked- equal and bilateral Secured at: 22 cm Tube secured with: Tape Dental Injury: Teeth and Oropharynx as per pre-operative assessment

## 2020-03-06 NOTE — Op Note (Signed)
Operative Note  Preoperative diagnosis:  1.  Prostate cancer   Postoperative diagnosis: 1.  Prostate cancer  Procedure(s): 1.  Robotic assisted laparoscopic prostatectomy with bilateral pelvic lymph node dissection 2.  Extensive lysis of adhesions  Surgeon: Link Snuffer, MD  Assistants: Estill Bamberg dancy--an assistant was needed due to the nature of the case being a robotic surgery requiring a bedside assistant for retraction, suctioning, exchanging instruments, etc.   Resident: Carmie Kanner  Anesthesia: General   Complications: None   EBL: 150 cc   Specimens: 1. prostate and seminal vesicles 2.  Periprosthetic fat 3.  Right pelvic lymph node packet 4.  Left pelvic lymph node packet  Drains/Catheters: 1. 55 French Foley catheter and JP drain   Intraoperative findings: prostate and seminal vesicles were removed en bloc Without any evidence of gross extraprostatic disease  Indication: 59 year old male recently diagnosed with prostate cancer presents for the previously mentioned operation.  Description of procedure:  The patient was identified and consent was obtained.  The patient was taken to the operating room and placed in the supine position.  The patient was placed under general anesthesia.  Perioperative antibiotics were administered.  The patient was placed in dorsal lithotomy.  Patient was prepped and draped in a standard sterile fashion and a timeout was performed.  The patient was placed in steep Trendelenburg.  Foley catheter was placed.  Veress needle was inserted in the right upper quadrant in the usual position of the 5 mm port And the drop test was performed with no evidence of any injury.  The abdomen was then insufflated to a pressure of 15.  The 5 mm assistant port was placed with the assistance of the camera visualizing entry.  Once entered, the inner trocar was removed.  A 5 mm camera was used to inspect the abdomen.  There was noted to be an extensive amount  of adhesions at the site of his prior umbilical hernia repair with mesh.  There was 1 piece of adherent bowel.  The remainder was adherent omentum.  The remaining 4 robotic working ports were placed under direct visualization.  The midline port was not placed at this time.  Extensive adhesions were lysed sharply.  Once there were enough adhesions released to place the midline port, we placed the robotic midline port under direct visualization.  Robot was docked.  The abdomen was inspected and there was no evidence of any visceral or vascular injury.  I first released colonic adhesions in the left lower quadrant.  I then retracted the sigmoid and rectum superiorly.  I first started with the posterior dissection by incising along the posterior peritoneum and identifying bilateral vas deferens.  These were divided and bilateral seminal vesicles were carefully dissected out.  Denonvier fascia was then entered posteriorly.  The bladder was then dropped by incising along the medial umbilical ligament bilaterally.  Periprosthetic fat was cleared off the prostate and passed off and passed off for specimen.  The endopelvic fascia was incised bilaterally and the puboprostatic ligaments were released.  A stapler with a vascular staple load was used to staple the dorsal venous complex.  I then incised along into the bladder neck and divided the bladder neck.  The catheter was brought out and used for retraction.  I divided the remainder of the bladder neck and then pulled the seminal vesicles out of this incision.  Careful dissection was performed and bilateral prostatic pedicles were released using Hem-o-lok clips and sharp dissection.  Spot cautery was  sparingly used.  Periprosthetic tissue was carefully released inferiorly and laterally, performing a nerve sparing operation bilaterally.  Once only the urethra remained attached, the anterior portion of the urethra was divided.  A 2-0 Vicryl stitch was placed at the 6  o'clock position.  The remainder of the urethra was divided and the prostate was placed in a specimen bag.  FloSeal was applied to the prostatectomy bed.  I then performed the bilateral pelvic lymph node dissection.  I first carefully removed lymph node packet from the right side overlying the external iliac artery and vein down to the level of the obturator nerve.  This was passed off for specimen and then I carefully removed the left sided pelvic lymph node packets again overlying the external iliac artery and vein down to the level of the obturator nerve taking care not to injure any vital structures.  The 2-0 Vicryl stitch was used to reapproximate the bladder and urethra at the 6 o'clock position.  A running 3-0 V lock suture was then used to reapproximate the bladder and urethra.  The bladder was filled with normal saline and there was no evidence of any leak at the anastomosis.  The anastomosis was watertight.  The Foley catheter was exchanged for a fresh catheter.  A JP drain was inserted from the left lateral port site.  This was secured down with a nylon stitch.  The robot was undocked and the patient was taken out of Trendelenburg.  The 12 mm port was closed with a 2-0 Vicryl using a [place].  All working ports were removed under direct visualization with the camera to ensure there was no bleeding from the sites.  The midline port incision was extended and the prior mesh was divided with heavy scissors.  The prostate was extracted in the specimen bag.   The midline incision fascia was closed with a running 0 Prolene which incorporated the prior mesh.  Skin was then closed with 4-0 Monocryl and Dermabond.  Exparel was used for anesthetic effect.  This concluded the operation.  The patient tolerated procedure well and was stable postoperatively.  Plan: Will obtain stat labs.  He will remain in the hospital overnight and hopefully be able to be discharged tomorrow.  He will keep his  catheter for 7-10 days.

## 2020-03-06 NOTE — Anesthesia Postprocedure Evaluation (Signed)
Anesthesia Post Note  Patient: [employer]  Procedure(s) Performed: XI ROBOTIC ASSISTED LAPAROSCOPIC RADICAL PROSTATECTOMY (N/A ) BILATERAL PELVIC LYMPH NODE DISSECTION (Bilateral ) LAPAROSCOPIC LYSIS OF ADHESIONS (N/A )     Patient location during evaluation: PACU Anesthesia Type: General Level of consciousness: awake and alert Pain management: pain level controlled Vital Signs Assessment: post-procedure vital signs reviewed and stable Respiratory status: spontaneous breathing, nonlabored ventilation, respiratory function stable and patient connected to nasal cannula oxygen Cardiovascular status: blood pressure returned to baseline and stable Postop Assessment: no apparent nausea or vomiting Anesthetic complications: no   No complications documented.  Last Vitals:  Vitals:   03/06/20 1630 03/06/20 1712  BP: (!) 157/98 (!) 145/94  Pulse: 81 78  Resp: 15 18  Temp:  36.8 C  SpO2: 100% 100%    Last Pain:  Vitals:   03/06/20 1712  TempSrc: Oral  PainSc: 0-No pain                 Effie Berkshire

## 2020-03-06 NOTE — Transfer of Care (Signed)
Immediate Anesthesia Transfer of Care Note  Patient: Jonathan Edwards  Procedure(s) Performed: XI ROBOTIC ASSISTED LAPAROSCOPIC RADICAL PROSTATECTOMY (N/A ) BILATERAL PELVIC LYMPH NODE DISSECTION (Bilateral ) LAPAROSCOPIC LYSIS OF ADHESIONS (N/A )  Patient Location: PACU  Anesthesia Type:General  Level of Consciousness: awake  Airway & Oxygen Therapy: Patient Spontanous Breathing and Patient connected to face mask oxygen  Post-op Assessment: Report given to RN, Post -op Vital signs reviewed and stable and Patient moving all extremities X 4  Post vital signs: Reviewed and stable  Last Vitals:  Vitals Value Taken Time  BP    Temp    Pulse 84 03/06/20 1527  Resp 19 03/06/20 1527  SpO2 100 % 03/06/20 1527  Vitals shown include unvalidated device data.  Last Pain:  Vitals:   03/06/20 1037  TempSrc:   PainSc: 0-No pain         Complications: No complications documented.

## 2020-03-07 ENCOUNTER — Encounter (HOSPITAL_COMMUNITY): Payer: Self-pay | Admitting: Urology

## 2020-03-07 LAB — BASIC METABOLIC PANEL
Anion gap: 9 (ref 5–15)
BUN: 13 mg/dL (ref 6–20)
CO2: 22 mmol/L (ref 22–32)
Calcium: 7.8 mg/dL — ABNORMAL LOW (ref 8.9–10.3)
Chloride: 106 mmol/L (ref 98–111)
Creatinine, Ser: 1.17 mg/dL (ref 0.61–1.24)
GFR calc Af Amer: >60 mL/min (ref >60)
GFR calc non Af Amer: >60 mL/min (ref >60)
Glucose, Bld: 240 mg/dL — ABNORMAL HIGH (ref 70–99)
Potassium: 4.5 mmol/L (ref 3.5–5.1)
Sodium: 137 mmol/L (ref 135–145)

## 2020-03-07 LAB — CREATININE, FLUID (PLEURAL, PERITONEAL, JP DRAINAGE): Creat, Fluid: 1.3 mg/dL

## 2020-03-07 LAB — HEMOGLOBIN AND HEMATOCRIT, BLOOD
HCT: 29 % — ABNORMAL LOW (ref 39.0–52.0)
HCT: 29.3 % — ABNORMAL LOW (ref 39.0–52.0)
HCT: 34.3 % — ABNORMAL LOW (ref 39.0–52.0)
HCT: 36.8 % — ABNORMAL LOW (ref 39.0–52.0)
Hemoglobin: 11.1 g/dL — ABNORMAL LOW (ref 13.0–17.0)
Hemoglobin: 11.9 g/dL — ABNORMAL LOW (ref 13.0–17.0)
Hemoglobin: 9.3 g/dL — ABNORMAL LOW (ref 13.0–17.0)
Hemoglobin: 9.5 g/dL — ABNORMAL LOW (ref 13.0–17.0)

## 2020-03-07 MED ORDER — CHLORHEXIDINE GLUCONATE CLOTH 2 % EX PADS
6.0000 | MEDICATED_PAD | Freq: Every day | CUTANEOUS | Status: DC
  Administered 2020-03-07 – 2020-03-10 (×3): 6 via TOPICAL

## 2020-03-07 MED ORDER — OXYBUTYNIN CHLORIDE 5 MG PO TABS
5.0000 mg | ORAL_TABLET | Freq: Three times a day (TID) | ORAL | Status: DC | PRN
  Filled 2020-03-07: qty 1

## 2020-03-07 NOTE — Progress Notes (Signed)
1 Day Post-Op Subjective: The patient is doing well. Had increased output from drain and mild bleeding from port sites overnight. Dry and closed this morning. No nausea or vomiting. Pain is adequately controlled.  Objective: Vital signs in last 24 hours: Temp:  [97.5 F (36.4 C)-98.2 F (36.8 C)] 98.2 F (36.8 C) (07/27 0436) Pulse Rate:  [77-108] 103 (07/27 0436) Resp:  [9-20] 18 (07/27 0436) BP: (128-157)/(90-105) 131/90 (07/27 0436) SpO2:  [97 %-100 %] 99 % (07/27 0436) Weight:  [111.6 kg] 111.6 kg (07/26 1732)  Intake/Output from previous day: 07/26 0701 - 07/27 0700 In: 3734.3 [P.O.:357; I.V.:2277.3; IV Piggyback:1100] Out: 1670 [Urine:650; Drains:820; Blood:200] Intake/Output this shift: Total I/O In: -  Out: 60 [Drains:60]  Physical Exam:  General: Alert and oriented. CV: RRR Lungs: Clear bilaterally. GI: Soft, Nondistended. Incisions: Clean, dry, and intact. JP drain with serosanguinous output Urine: Clear Extremities: Nontender, no erythema, no edema.  Lab Results: Recent Labs    03/06/20 2027 03/07/20 0031 03/07/20 0511  HGB 12.9* 11.9* 11.1*  HCT 40.8 36.8* 34.3*      Assessment/Plan: POD# 1 s/p robotic prostatectomy.  1) Continue IVF 2) Ambulate, Incentive spirometry 3) Transition to oral pain medication 4) Will check JP creatinine today 5) Repeat H/H at 3pm.     LOS: 0 days   Carmie Kanner 03/07/2020, 11:10 AM

## 2020-03-07 NOTE — Progress Notes (Signed)
Pt able to ambulate in the hall short distance and tolerated well without dizziness. Pt back in room and assisted to chair.  Dr. Gloriann Loan updated via phone regarding Hgb level of 9.5. New ordrs placed. Maintain current plan of care

## 2020-03-07 NOTE — Progress Notes (Signed)
Pt assisted up to the chair. Tolerated activity better this attempt Pt states "I am dizzy but not as bad" Maintain current plan of care for Pt.

## 2020-03-07 NOTE — Progress Notes (Signed)
Pt assisted up to side post op. Pt became dizzy and Pt assisted back to bed lying down. Small amount of oozing noted from umbilicus incision. All other port sites are dry at this time. VS 98.6 125/79 Pulse 92 and Resp. 18. Maintain current plan of care for Pt and monitor closely

## 2020-03-08 ENCOUNTER — Encounter: Payer: Self-pay | Admitting: Urology

## 2020-03-08 DIAGNOSIS — D62 Acute posthemorrhagic anemia: Secondary | ICD-10-CM | POA: Diagnosis not present

## 2020-03-08 DIAGNOSIS — N3289 Other specified disorders of bladder: Secondary | ICD-10-CM | POA: Diagnosis not present

## 2020-03-08 DIAGNOSIS — Z20822 Contact with and (suspected) exposure to covid-19: Secondary | ICD-10-CM | POA: Diagnosis present

## 2020-03-08 DIAGNOSIS — C61 Malignant neoplasm of prostate: Secondary | ICD-10-CM | POA: Diagnosis present

## 2020-03-08 DIAGNOSIS — I10 Essential (primary) hypertension: Secondary | ICD-10-CM | POA: Diagnosis present

## 2020-03-08 DIAGNOSIS — I959 Hypotension, unspecified: Secondary | ICD-10-CM | POA: Diagnosis not present

## 2020-03-08 DIAGNOSIS — Z79899 Other long term (current) drug therapy: Secondary | ICD-10-CM | POA: Diagnosis not present

## 2020-03-08 DIAGNOSIS — K219 Gastro-esophageal reflux disease without esophagitis: Secondary | ICD-10-CM | POA: Diagnosis present

## 2020-03-08 DIAGNOSIS — Z87891 Personal history of nicotine dependence: Secondary | ICD-10-CM | POA: Diagnosis not present

## 2020-03-08 LAB — BASIC METABOLIC PANEL
Anion gap: 6 (ref 5–15)
BUN: 25 mg/dL — ABNORMAL HIGH (ref 6–20)
CO2: 24 mmol/L (ref 22–32)
Calcium: 7.6 mg/dL — ABNORMAL LOW (ref 8.9–10.3)
Chloride: 102 mmol/L (ref 98–111)
Creatinine, Ser: 1.47 mg/dL — ABNORMAL HIGH (ref 0.61–1.24)
GFR calc Af Amer: 59 mL/min — ABNORMAL LOW (ref >60)
GFR calc non Af Amer: 51 mL/min — ABNORMAL LOW (ref >60)
Glucose, Bld: 134 mg/dL — ABNORMAL HIGH (ref 70–99)
Potassium: 4.2 mmol/L (ref 3.5–5.1)
Sodium: 132 mmol/L — ABNORMAL LOW (ref 135–145)

## 2020-03-08 LAB — HEMOGLOBIN AND HEMATOCRIT, BLOOD
HCT: 26.3 % — ABNORMAL LOW (ref 39.0–52.0)
Hemoglobin: 8.5 g/dL — ABNORMAL LOW (ref 13.0–17.0)

## 2020-03-08 MED ORDER — SODIUM CHLORIDE 0.9 % IV BOLUS
1000.0000 mL | Freq: Once | INTRAVENOUS | Status: AC
  Administered 2020-03-08: 1000 mL via INTRAVENOUS

## 2020-03-08 MED ORDER — ACETAMINOPHEN 500 MG PO TABS
1000.0000 mg | ORAL_TABLET | Freq: Three times a day (TID) | ORAL | Status: DC
  Administered 2020-03-08 – 2020-03-10 (×6): 1000 mg via ORAL
  Filled 2020-03-08 (×6): qty 2

## 2020-03-08 NOTE — Progress Notes (Signed)
Pt complained of increased bladder pain. MD at bedside. MD gently hand irrigated foley and removed multiple small clots and 858ml of yellow urine returned into the foley bag. Pt stated immediate relief. MD gave verbal order to gently hand irrigate with 30-25ml of NS as needed  Will continue to monitor.

## 2020-03-08 NOTE — Progress Notes (Signed)
Pt complained of increased abdominal/bladder pain. Pt stood up to get back into the bed and several small to medium clots passed through foley and then 638ml of urine returned into the foley bag. Pt stated relief from bladder pain but still complained of abdominal soreness, PRN 0.16ml of IV dilaudid given. Will continue to monitor.

## 2020-03-08 NOTE — Progress Notes (Signed)
2 Days Post-Op Subjective: Patient ambulated yesterday, did have one episode of dizziness. Pain well controlled. Tolerating clears. No flatus  JP creatinine 1.3 1L UOP for past 24 hours 320cc from JP, 120 overnight.   Objective: Vital signs in last 24 hours: Temp:  [98.7 F (37.1 C)-99.3 F (37.4 C)] 98.7 F (37.1 C) (07/28 1031) Pulse Rate:  [84-110] 90 (07/28 1031) Resp:  [14-18] 18 (07/28 1031) BP: (98-121)/(65-75) 113/66 (07/28 1200) SpO2:  [95 %-100 %] 100 % (07/28 1031)  Intake/Output from previous day: 07/27 0701 - 07/28 0700 In: 2983.5 [P.O.:600; I.V.:2383.5] Out: 1320 [Urine:1000; Drains:320] Intake/Output this shift: Total I/O In: -  Out: 655 [Urine:575; Drains:80]  Physical Exam:  General: Alert and oriented. CV: RRR Lungs: Breathing comfortably on room air GI: Soft, Nondistended. Incisions: Clean, dry, and intact. JP drain with serosanguinous output Urine: Clear Extremities: Nontender, no erythema, no edema.  Lab Results: Recent Labs    03/07/20 1554 03/07/20 2022 03/08/20 0840  HGB 9.5* 9.3* 8.5*  HCT 29.3* 29.0* 26.3*      Assessment/Plan: POD# 2 s/p robotic prostatectomy.  1) Continue IVF, 1 L bolus NS this AM for symptomatic hypotension 2) Ambulate, Incentive spirometry 3) Continue oral pain medication, PRN ditropan for bladder spasms 4) Continue clears 5) CBC and BMP this AM    LOS: 0 days   Carmie Kanner 03/08/2020, 12:17 PM

## 2020-03-08 NOTE — Progress Notes (Signed)
MD paged regarding hgb result and pt Bps. Awaiting response.

## 2020-03-09 LAB — PREPARE RBC (CROSSMATCH)

## 2020-03-09 LAB — HEMOGLOBIN AND HEMATOCRIT, BLOOD
HCT: 23.2 % — ABNORMAL LOW (ref 39.0–52.0)
HCT: 26.2 % — ABNORMAL LOW (ref 39.0–52.0)
Hemoglobin: 7.5 g/dL — ABNORMAL LOW (ref 13.0–17.0)
Hemoglobin: 8.5 g/dL — ABNORMAL LOW (ref 13.0–17.0)

## 2020-03-09 MED ORDER — SODIUM CHLORIDE 0.9% IV SOLUTION: Freq: Once | INTRAVENOUS | Status: AC

## 2020-03-09 NOTE — Progress Notes (Signed)
3 Days Post-Op Subjective: Patient reports flatus overnight, significant improvement in abdominal discomfort. Catheter required flushing yesterday evening, now flowing well. Ambulating daily. No further dizziness  4L UOP for past 24 hours 170cc from JP  Objective: Vital signs in last 24 hours: Temp:  [98.5 F (36.9 C)-98.8 F (37.1 C)] 98.8 F (37.1 C) (07/29 0504) Pulse Rate:  [89-95] 89 (07/29 0504) Resp:  [17-18] 18 (07/29 0504) BP: (103-123)/(63-81) 123/81 (07/29 0504) SpO2:  [98 %-100 %] 100 % (07/29 0504)  Intake/Output from previous day: 07/28 0701 - 07/29 0700 In: 2136.1 [P.O.:860; I.V.:1276.1] Out: 4445 [Urine:4275; Drains:170] Intake/Output this shift: Total I/O In: -  Out: 2100 [Urine:2050; Drains:50]  Physical Exam:  General: Alert and oriented. CV: RRR Lungs: Breathing comfortably on room air GI: Soft, Nondistended. Incisions: Clean, dry, and intact. JP drain with serosanguinous output Urine: Clear yellow Extremities: Nontender, no erythema, no edema.  Lab Results: Recent Labs    03/07/20 2022 03/08/20 0840 03/09/20 0742  HGB 9.3* 8.5* 7.5*  HCT 29.0* 26.3* 23.2*      Assessment/Plan: POD# 3 s/p robotic prostatectomy, recovering well  1) Saline lock fluids today 2) Will transfuse 1 u pRBC for anemia, low concern for active bleeding 2) Ambulate, Incentive spirometry 3) Continue oral pain medication, PRN ditropan for bladder spasms 4) Continue clears 5) Recheck H/H following transfusion    LOS: 1 day   Carmie Kanner 03/09/2020, 8:41 AM

## 2020-03-10 LAB — TYPE AND SCREEN
ABO/RH(D): O POS
Antibody Screen: NEGATIVE
Unit division: 0

## 2020-03-10 LAB — HEMOGLOBIN AND HEMATOCRIT, BLOOD
HCT: 26.1 % — ABNORMAL LOW (ref 39.0–52.0)
Hemoglobin: 8.4 g/dL — ABNORMAL LOW (ref 13.0–17.0)

## 2020-03-10 LAB — BPAM RBC
Blood Product Expiration Date: 202108282359
ISSUE DATE / TIME: 202107291122
Unit Type and Rh: 5100

## 2020-03-10 LAB — SURGICAL PATHOLOGY

## 2020-03-10 NOTE — Discharge Summary (Addendum)
[facility] Discharge Summary  Admit date: 03/06/2020  Discharge date and time: 03/10/20   Discharge to: Home  Discharge Service: Urology  Discharge Attending Physician:  Dr Link Snuffer  Discharge  Diagnoses: Prostate cancer  Secondary Diagnosis: Acute blood loss anemia  OR Procedures: Procedure(s): XI ROBOTIC ASSISTED LAPAROSCOPIC RADICAL PROSTATECTOMY BILATERAL PELVIC LYMPH NODE DISSECTION LAPAROSCOPIC LYSIS OF ADHESIONS 03/06/2020   Ancillary Procedures: None   Discharge Day Services: The patient was seen and examined by the Urology team both in the morning and immediately prior to discharge.  Vital signs and laboratory values were stable and within normal limits.  The physical exam was benign and unchanged and all surgical wounds were examined.  Discharge instructions were explained and all questions answered.  Subjective  No acute events overnight. Pain Controlled. No fever or chills.  Objective Patient Vitals for the past 8 hrs:  BP Temp Temp src Pulse Resp SpO2  03/10/20 0608 123/80 98.9 F (37.2 C) Oral 84 17 97 %  03/10/20 0137 118/73 99.2 F (37.3 C) Oral 86 17 94 %   No intake/output data recorded.  General Appearance:        No acute distress Lungs:                       Normal work of breathing on room air Heart:                                Regular rate and rhythm Abdomen:                         Soft, non-tender, non-distended GU:                  Urine clear yellow in foley catheter   Hospital Course:  The patient underwent robotic assisted laparoscopic prostatectomy on 03/06/2020.  The patient tolerated the procedure well, was extubated in the OR, and afterwards was taken to the PACU for routine post-surgical care. When stable the patient was transferred to the floor.   The patient did well postoperatively. The patient was noted to have hypotension and elevated drain output on POD1. He received IV fluid bolus to which he responded well. His JP  creatinine was consistent with serum. He continued to have a slow decline in his hemoglobin, and received 1 u pRBC on POD3, to which his hemoglobin responded appropriately. His hemoglobin was stable the following day, and drain output decreased. The patient's diet was slowly advanced and at the time of discharge was tolerating a regular diet.  The patient was discharged home 4 Days Post-Op, at which point was tolerating a regular solid diet, was able to void spontaneously, have adequate pain control with P.O. pain medication, and could ambulate without difficulty. The patient will follow up with [place] for post op check and foley catheter removal.    Condition at Discharge: Improved  Discharge Medications:  Allergies as of 03/10/2020   No Known Allergies     Medication List    TAKE these medications   acetaminophen 325 MG tablet Commonly known as: TYLENOL Take 650 mg by mouth every 6 (six) hours as needed for moderate pain or headache.   cyclobenzaprine 10 MG tablet Commonly known as: FLEXERIL Take 10 mg by mouth 3 (three) times daily as needed for muscle spasms.   HYDROcodone-acetaminophen 5-325 MG tablet Commonly known as: Norco Take  1-2 tablets by mouth every 6 (six) hours as needed for moderate pain.   lisinopril-hydrochlorothiazide 20-12.5 MG tablet Commonly known as: ZESTORETIC TAKE 1 TABLET BY MOUTH EVERY DAY   multivitamin with minerals Tabs tablet Take 1 tablet by mouth daily.   naproxen sodium 220 MG tablet Commonly known as: ALEVE Take 440 mg by mouth daily as needed (pain).   OMEGA 3 PO Take 1 capsule by mouth daily.   prednisoLONE acetate 1 % ophthalmic suspension Commonly known as: PRED FORTE Place 1 drop into the left eye daily.   Probiotic Caps Take 1 capsule by mouth daily.   sulfamethoxazole-trimethoprim 800-160 MG tablet Commonly known as: BACTRIM DS Take 1 tablet by mouth 2 (two) times daily. Start the day prior to foley removal appointment   tadalafil  10 MG tablet Commonly known as: Cialis Take 1 tablet (10 mg total) by mouth daily as needed for erectile dysfunction.   VITAMIN D PO Take 1 capsule by mouth daily.

## 2020-03-12 ENCOUNTER — Other Ambulatory Visit: Payer: Self-pay

## 2020-03-12 ENCOUNTER — Emergency Department (HOSPITAL_COMMUNITY): Payer: Federal, State, Local not specified - PPO

## 2020-03-12 ENCOUNTER — Inpatient Hospital Stay (HOSPITAL_COMMUNITY)
Admission: EM | Admit: 2020-03-12 | Discharge: 2020-03-15 | DRG: 394 | Disposition: A | Discharge status: Home / Self Care | Payer: Federal, State, Local not specified - PPO | Attending: Urology | Admitting: Urology

## 2020-03-12 ENCOUNTER — Encounter (HOSPITAL_COMMUNITY): Payer: Self-pay | Admitting: Emergency Medicine

## 2020-03-12 DIAGNOSIS — K9189 Other postprocedural complications and disorders of digestive system: Secondary | ICD-10-CM | POA: Diagnosis not present

## 2020-03-12 DIAGNOSIS — R32 Unspecified urinary incontinence: Secondary | ICD-10-CM | POA: Diagnosis not present

## 2020-03-12 DIAGNOSIS — R188 Other ascites: Secondary | ICD-10-CM | POA: Diagnosis present

## 2020-03-12 DIAGNOSIS — I10 Essential (primary) hypertension: Secondary | ICD-10-CM | POA: Diagnosis present

## 2020-03-12 DIAGNOSIS — Z20822 Contact with and (suspected) exposure to covid-19: Secondary | ICD-10-CM | POA: Diagnosis present

## 2020-03-12 DIAGNOSIS — R52 Pain, unspecified: Secondary | ICD-10-CM | POA: Diagnosis not present

## 2020-03-12 DIAGNOSIS — K567 Ileus, unspecified: Secondary | ICD-10-CM | POA: Diagnosis not present

## 2020-03-12 DIAGNOSIS — K429 Umbilical hernia without obstruction or gangrene: Secondary | ICD-10-CM | POA: Diagnosis not present

## 2020-03-12 DIAGNOSIS — R1084 Generalized abdominal pain: Secondary | ICD-10-CM | POA: Diagnosis not present

## 2020-03-12 DIAGNOSIS — N3289 Other specified disorders of bladder: Secondary | ICD-10-CM | POA: Diagnosis not present

## 2020-03-12 DIAGNOSIS — C61 Malignant neoplasm of prostate: Secondary | ICD-10-CM | POA: Diagnosis present

## 2020-03-12 DIAGNOSIS — R109 Unspecified abdominal pain: Secondary | ICD-10-CM | POA: Diagnosis not present

## 2020-03-12 DIAGNOSIS — Z79899 Other long term (current) drug therapy: Secondary | ICD-10-CM

## 2020-03-12 DIAGNOSIS — R1031 Right lower quadrant pain: Secondary | ICD-10-CM | POA: Diagnosis not present

## 2020-03-12 DIAGNOSIS — Z9079 Acquired absence of other genital organ(s): Secondary | ICD-10-CM

## 2020-03-12 DIAGNOSIS — Y838 Other surgical procedures as the cause of abnormal reaction of the patient, or of later complication, without mention of misadventure at the time of the procedure: Secondary | ICD-10-CM | POA: Diagnosis not present

## 2020-03-12 DIAGNOSIS — K219 Gastro-esophageal reflux disease without esophagitis: Secondary | ICD-10-CM | POA: Diagnosis present

## 2020-03-12 DIAGNOSIS — K6389 Other specified diseases of intestine: Secondary | ICD-10-CM | POA: Diagnosis not present

## 2020-03-12 DIAGNOSIS — Z87891 Personal history of nicotine dependence: Secondary | ICD-10-CM | POA: Diagnosis not present

## 2020-03-12 DIAGNOSIS — R1032 Left lower quadrant pain: Secondary | ICD-10-CM | POA: Diagnosis not present

## 2020-03-12 LAB — CBC WITH DIFFERENTIAL/PLATELET
Abs Immature Granulocytes: 0.15 10*3/uL — ABNORMAL HIGH (ref 0.00–0.07)
Basophils Absolute: 0 10*3/uL (ref 0.0–0.1)
Basophils Relative: 0 %
Eosinophils Absolute: 0 10*3/uL (ref 0.0–0.5)
Eosinophils Relative: 0 %
HCT: 31.1 % — ABNORMAL LOW (ref 39.0–52.0)
Hemoglobin: 10.1 g/dL — ABNORMAL LOW (ref 13.0–17.0)
Immature Granulocytes: 1 %
Lymphocytes Relative: 8 %
Lymphs Abs: 1.3 10*3/uL (ref 0.7–4.0)
MCH: 27.2 pg (ref 26.0–34.0)
MCHC: 32.5 g/dL (ref 30.0–36.0)
MCV: 83.6 fL (ref 80.0–100.0)
Monocytes Absolute: 1.7 10*3/uL — ABNORMAL HIGH (ref 0.1–1.0)
Monocytes Relative: 10 %
Neutro Abs: 14.3 10*3/uL — ABNORMAL HIGH (ref 1.7–7.7)
Neutrophils Relative %: 81 %
Platelets: 282 10*3/uL (ref 150–400)
RBC: 3.72 MIL/uL — ABNORMAL LOW (ref 4.22–5.81)
RDW: 14.6 % (ref 11.5–15.5)
WBC: 17.4 10*3/uL — ABNORMAL HIGH (ref 4.0–10.5)
nRBC: 0 % (ref 0.0–0.2)

## 2020-03-12 LAB — COMPREHENSIVE METABOLIC PANEL
ALT: 31 U/L (ref 0–44)
AST: 31 U/L (ref 15–41)
Albumin: 3.7 g/dL (ref 3.5–5.0)
Alkaline Phosphatase: 44 U/L (ref 38–126)
Anion gap: 11 (ref 5–15)
BUN: 12 mg/dL (ref 6–20)
CO2: 25 mmol/L (ref 22–32)
Calcium: 8.2 mg/dL — ABNORMAL LOW (ref 8.9–10.3)
Chloride: 99 mmol/L (ref 98–111)
Creatinine, Ser: 1.2 mg/dL (ref 0.61–1.24)
GFR calc Af Amer: >60 mL/min (ref >60)
GFR calc non Af Amer: >60 mL/min (ref >60)
Glucose, Bld: 139 mg/dL — ABNORMAL HIGH (ref 70–99)
Potassium: 4.3 mmol/L (ref 3.5–5.1)
Sodium: 135 mmol/L (ref 135–145)
Total Bilirubin: 1.1 mg/dL (ref 0.3–1.2)
Total Protein: 6.7 g/dL (ref 6.5–8.1)

## 2020-03-12 LAB — I-STAT CREATININE, ED: Creatinine, Ser: 1.1 mg/dL (ref 0.61–1.24)

## 2020-03-12 LAB — LIPASE, BLOOD: Lipase: 28 U/L (ref 11–51)

## 2020-03-12 MED ORDER — POLYETHYLENE GLYCOL 3350 17 G PO PACK: 17.0000 g | PACK | Freq: Every day | ORAL | Status: DC | PRN

## 2020-03-12 MED ORDER — LISINOPRIL-HYDROCHLOROTHIAZIDE 20-12.5 MG PO TABS: 1.0000 | ORAL_TABLET | Freq: Every day | ORAL | Status: DC

## 2020-03-12 MED ORDER — DIPHENHYDRAMINE HCL 12.5 MG/5ML PO ELIX: 12.5000 mg | ORAL_SOLUTION | Freq: Four times a day (QID) | ORAL | Status: DC | PRN

## 2020-03-12 MED ORDER — SODIUM CHLORIDE 0.9 % IV SOLN: INTRAVENOUS | Status: DC

## 2020-03-12 MED ORDER — OXYBUTYNIN CHLORIDE 5 MG PO TABS: 5.0000 mg | ORAL_TABLET | Freq: Three times a day (TID) | ORAL | Status: DC | PRN

## 2020-03-12 MED ORDER — SODIUM CHLORIDE (PF) 0.9 % IJ SOLN
INTRAMUSCULAR | Status: AC
  Filled 2020-03-12: qty 50

## 2020-03-12 MED ORDER — ONDANSETRON HCL 4 MG/2ML IJ SOLN
4.0000 mg | Freq: Once | INTRAMUSCULAR | Status: AC
  Administered 2020-03-12: 4 mg via INTRAVENOUS
  Filled 2020-03-12: qty 2

## 2020-03-12 MED ORDER — PREDNISOLONE ACETATE 1 % OP SUSP
1.0000 [drp] | Freq: Every day | OPHTHALMIC | Status: DC
  Administered 2020-03-14 – 2020-03-15 (×2): 1 [drp] via OPHTHALMIC
  Filled 2020-03-12: qty 5

## 2020-03-12 MED ORDER — CYCLOBENZAPRINE HCL 10 MG PO TABS
10.0000 mg | ORAL_TABLET | Freq: Three times a day (TID) | ORAL | Status: DC | PRN
  Administered 2020-03-13: 10 mg via ORAL
  Filled 2020-03-12: qty 1

## 2020-03-12 MED ORDER — MORPHINE SULFATE (PF) 4 MG/ML IV SOLN
4.0000 mg | Freq: Once | INTRAVENOUS | Status: AC
  Administered 2020-03-12: 4 mg via INTRAVENOUS
  Filled 2020-03-12: qty 1

## 2020-03-12 MED ORDER — ONDANSETRON HCL 4 MG/2ML IJ SOLN: 4.0000 mg | INTRAMUSCULAR | Status: DC | PRN

## 2020-03-12 MED ORDER — SODIUM CHLORIDE 0.9 % IV SOLN
1.0000 g | Freq: Once | INTRAVENOUS | Status: AC
  Administered 2020-03-12: 1 g via INTRAVENOUS
  Filled 2020-03-12: qty 10

## 2020-03-12 MED ORDER — MORPHINE SULFATE (PF) 2 MG/ML IV SOLN
2.0000 mg | INTRAVENOUS | Status: DC | PRN
  Administered 2020-03-13: 2 mg via INTRAVENOUS
  Filled 2020-03-12 (×2): qty 1

## 2020-03-12 MED ORDER — BELLADONNA ALKALOIDS-OPIUM 16.2-60 MG RE SUPP: 1.0000 | Freq: Four times a day (QID) | RECTAL | Status: DC | PRN

## 2020-03-12 MED ORDER — ACETAMINOPHEN 325 MG PO TABS
650.0000 mg | ORAL_TABLET | ORAL | Status: DC | PRN
  Administered 2020-03-14: 650 mg via ORAL
  Filled 2020-03-12: qty 2

## 2020-03-12 MED ORDER — SODIUM CHLORIDE 0.9 % IV SOLN
2.0000 g | INTRAVENOUS | Status: DC
  Administered 2020-03-13 – 2020-03-14 (×2): 2 g via INTRAVENOUS
  Filled 2020-03-12 (×3): qty 20

## 2020-03-12 MED ORDER — IOHEXOL 300 MG/ML  SOLN
100.0000 mL | Freq: Once | INTRAMUSCULAR | Status: AC | PRN
  Administered 2020-03-12: 100 mL via INTRAVENOUS

## 2020-03-12 MED ORDER — HYDROCODONE-ACETAMINOPHEN 5-325 MG PO TABS
1.0000 | ORAL_TABLET | ORAL | Status: DC | PRN
  Administered 2020-03-13: 1 via ORAL
  Administered 2020-03-13: 2 via ORAL
  Administered 2020-03-14 – 2020-03-15 (×2): 1 via ORAL
  Filled 2020-03-12: qty 2
  Filled 2020-03-12 (×3): qty 1
  Filled 2020-03-12: qty 2

## 2020-03-12 MED ORDER — DIPHENHYDRAMINE HCL 50 MG/ML IJ SOLN: 12.5000 mg | Freq: Four times a day (QID) | INTRAMUSCULAR | Status: DC | PRN

## 2020-03-12 NOTE — ED Provider Notes (Signed)
[place] [facility] DEPT Provider Note   CSN: [id number] Arrival date & time: 03/12/20  2014     History Chief Complaint  Patient presents with  . Abdominal Pain  . Constipation    Jonathan Edwards is a 59 y.o. male.  59 yo M with a chief complaints of crampy lower abdominal pain.  Patient states he has not had a bowel movement in a couple weeks.  He actually had prostate surgery a few days ago.  Has been having some worsening abdominal pain and feeling like he needs to have a bowel movement but has been unable to go.  Was passing gas out of his bottom until about 4 hours ago.  No nausea or vomiting.  No fevers.  The history is provided by the patient.  Abdominal Pain Pain location:  LLQ and RLQ Pain quality: aching   Pain radiates to:  Does not radiate Pain severity:  Moderate Onset quality:  Gradual Duration:  2 weeks Timing:  Constant Progression:  Worsening Chronicity:  New Relieved by:  Nothing Worsened by:  Nothing Associated symptoms: constipation   Associated symptoms: no chest pain, no chills, no diarrhea, no fever, no shortness of breath and no vomiting   Constipation Associated symptoms: abdominal pain   Associated symptoms: no diarrhea, no fever and no vomiting        Past Medical History:  Diagnosis Date  . Cancer [facility])    prostate  . GERD (gastroesophageal reflux disease)   . Hypertension   . Pneumonia   . Tuberculosis    + ppd test  when 59 years old never had any symptoms    Patient Active Problem List   Diagnosis Date Noted  . Abdominal pain 03/12/2020  . Prostate cancer ([place]) 03/06/2020  . Increased prostate specific antigen (PSA) velocity 10/12/2018  . Dupuytren contracture 10/08/2018  . Healthcare maintenance 10/08/2018  . Erectile dysfunction due to arterial insufficiency 10/08/2018  . Hip flexor tendon tightness, right 07/15/2017  . Synovitis of right knee 06/24/2017  . Lateral meniscal tear 06/24/2017  . Right  knee pain 01/07/2017  . Midline low back pain without sciatica 04/22/2016  . Acute bronchitis 10/03/2014  . Plantar fasciitis of right foot 09/27/2014  . Subacromial bursitis 08/16/2014  . HERNIA, UMBILICAL [id number]  . PROSTATITIS, MILD 05/17/2008  . Essential hypertension 12/26/2006  . Allergic rhinitis 12/26/2006    Past Surgical History:  Procedure Laterality Date  . EYE SURGERY     hole repair left eye and detached retina surgery  . HERNIA REPAIR     umbilical with mesh  . LAPAROSCOPIC LYSIS OF ADHESIONS N/A 03/06/2020   Procedure: LAPAROSCOPIC LYSIS OF ADHESIONS;  Surgeon: Lucas Mallow, MD;  Location: [facility] ORS;  Service: Urology;  Laterality: N/A;  . PELVIC LYMPH NODE DISSECTION Bilateral 03/06/2020   Procedure: BILATERAL PELVIC LYMPH NODE DISSECTION;  Surgeon: Lucas Mallow, MD;  Location: [facility];  Service: Urology;  Laterality: Bilateral;  . ROBOT ASSISTED LAPAROSCOPIC RADICAL PROSTATECTOMY N/A 03/06/2020   Procedure: XI ROBOTIC ASSISTED LAPAROSCOPIC RADICAL PROSTATECTOMY;  Surgeon: Lucas Mallow, MD;  Location: [facility];  Service: Urology;  Laterality: N/A;  . TONSILLECTOMY         History reviewed. No pertinent family history.  Social History   Tobacco Use  . Smoking status: Former Smoker    Years: 30.00    Quit date: 08/12/1998    Years since quitting: 21.5  . Smokeless tobacco: Never Used  Vaping  Use  . Vaping Use: Never used  Substance Use Topics  . Alcohol use: Yes    Comment: occasional  . Drug use: No    Home Medications Prior to Admission medications   Medication Sig Start Date End Date Taking? Authorizing Provider  acetaminophen (TYLENOL) 325 MG tablet Take 650 mg by mouth every 6 (six) hours as needed for moderate pain or headache.   Yes [provider]  cyclobenzaprine (FLEXERIL) 10 MG tablet Take 10 mg by mouth 3 (three) times daily as needed for muscle spasms.   Yes [provider]  HYDROcodone-acetaminophen (NORCO)  5-325 MG tablet Take 1-2 tablets by mouth every 6 (six) hours as needed for moderate pain. 03/06/20  Yes Dancy, Amanda, PA-C  lisinopril-hydrochlorothiazide (ZESTORETIC) 20-12.5 MG tablet TAKE 1 TABLET BY MOUTH EVERY DAY Patient taking differently: Take 1 tablet by mouth daily.  02/25/20  Yes Libby Maw, MD  magnesium citrate SOLN Take 0.5 Bottles by mouth See admin instructions. 1/2 bottle today at 11:30am and 1/2 bottle today at 3:30pm   Yes [provider]  Multiple Vitamin (MULTIVITAMIN WITH MINERALS) TABS tablet Take 1 tablet by mouth daily.   Yes [provider]  naproxen sodium (ALEVE) 220 MG tablet Take 440 mg by mouth daily as needed (pain).   Yes [provider]  Omega-3 Fatty Acids (OMEGA 3 PO) Take 1 capsule by mouth daily.   Yes [provider]  prednisoLONE acetate (PRED FORTE) 1 % ophthalmic suspension Place 1 drop into the left eye daily.   Yes [provider]  Probiotic CAPS Take 1 capsule by mouth daily.   Yes [provider]  tadalafil (CIALIS) 10 MG tablet Take 1 tablet (10 mg total) by mouth daily as needed for erectile dysfunction. 08/31/19  Yes Libby Maw, MD  VITAMIN D PO Take 1 capsule by mouth daily.   Yes [provider]  sulfamethoxazole-trimethoprim (BACTRIM DS) 800-160 MG tablet Take 1 tablet by mouth 2 (two) times daily. Start the day prior to foley removal appointment 03/06/20   Debbrah Alar, PA-C    Allergies    Patient has no known allergies.  Review of Systems   Review of Systems  Constitutional: Negative for chills and fever.  HENT: Negative for congestion and facial swelling.   Eyes: Negative for discharge and visual disturbance.  Respiratory: Negative for shortness of breath.   Cardiovascular: Negative for chest pain and palpitations.  Gastrointestinal: Positive for abdominal pain and constipation. Negative for diarrhea and vomiting.  Musculoskeletal: Negative for  arthralgias and myalgias.  Skin: Negative for color change and rash.  Neurological: Negative for tremors, syncope and headaches.  Psychiatric/Behavioral: Negative for confusion and dysphoric mood.    Physical Exam Updated Vital Signs BP (!) 168/107   Pulse (!) 107   Temp 99.3 F (37.4 C) (Oral)   Resp 14   Ht 6' 1.5" (1.867 m)   Wt (!) 111.6 kg   SpO2 97%   BMI 32.02 kg/m   Physical Exam Vitals and nursing note reviewed.  Constitutional:      Appearance: He is well-developed.  HENT:     Head: Normocephalic and atraumatic.  Eyes:     Pupils: Pupils are equal, round, and reactive to light.  Neck:     Vascular: No JVD.  Cardiovascular:     Rate and Rhythm: Normal rate and regular rhythm.     Heart sounds: No murmur heard.  No friction rub. No gallop.   Pulmonary:  Effort: No respiratory distress.     Breath sounds: No wheezing.  Abdominal:     General: There is no distension.     Tenderness: There is abdominal tenderness (mild diffuse, tympanitic on percussion). There is no guarding or rebound.     Comments: Multiple laparoscopic wound sites appear clean dry and intact and are currently still Dermabonded.  Patient has a midline lower abdominal incision that is weeping blood at the inferior aspect of the wound. No erythema, no fluctuance  Musculoskeletal:        General: Normal range of motion.     Cervical back: Normal range of motion and neck supple.  Skin:    Coloration: Skin is not pale.     Findings: No rash.  Neurological:     Mental Status: He is alert and oriented to person, place, and time.  Psychiatric:        Behavior: Behavior normal.     ED Results / Procedures / Treatments   Labs (all labs ordered are listed, but only abnormal results are displayed) Labs Reviewed  CBC WITH DIFFERENTIAL/PLATELET - Abnormal; Notable for the following components:      Result Value   WBC 17.4 (*)    RBC 3.72 (*)    Hemoglobin 10.1 (*)    HCT 31.1 (*)    Neutro  Abs 14.3 (*)    Monocytes Absolute 1.7 (*)    Abs Immature Granulocytes 0.15 (*)    All other components within normal limits  COMPREHENSIVE METABOLIC PANEL - Abnormal; Notable for the following components:   Glucose, Bld 139 (*)    Calcium 8.2 (*)    All other components within normal limits  SARS CORONAVIRUS 2 BY RT PCR (HOSPITAL ORDER, [facility])  LIPASE, BLOOD  CBC  BASIC METABOLIC PANEL  I-STAT CREATININE, ED    EKG None  Radiology CT ABDOMEN PELVIS W CONTRAST  Result Date: 03/12/2020 CLINICAL DATA:  Abdominal pain, recent laparoscopic soft robot assisted radical prostatectomy performed 03/06/2020 EXAM: CT ABDOMEN AND PELVIS WITH CONTRAST TECHNIQUE: Multidetector CT imaging of the abdomen and pelvis was performed using the standard protocol following bolus administration of intravenous contrast. CONTRAST:  124mL OMNIPAQUE IOHEXOL 300 MG/ML  SOLN COMPARISON:  CT abdomen pelvis 05/11/2009 FINDINGS: Lower chest: Lung bases are clear. Normal heart size. No pericardial effusion. Hepatobiliary: Diffuse hepatic hypoattenuation compatible with hepatic steatosis. Few scattered subcentimeter hyper 2 foci in the liver too small to fully characterize on CT imaging but statistically likely benign. No focal concerning liver lesions. Smooth liver surface contour. Mild gallbladder distention without evidence of pericholecystic fluid or inflammation. No biliary ductal dilatation or visible intraductal gallstones. Pancreas: Unremarkable. No pancreatic ductal dilatation or surrounding inflammatory changes. Spleen: Normal in size. No concerning splenic lesions. Adrenals/Urinary Tract: Normal adrenal glands. Kidneys enhance and excrete uniformly. No concerning renal mass, urolithiasis or hydronephrosis. Urinary bladder partially decompressed by inflated Foley catheter. There is extensive circumferential bladder wall thickening and perivesicular hazy stranding, small amount of fluid  seen along the anterior bladder is well, contained within the space of Retzius. Postsurgical changes about the level of the prostate and prostatic urethra. Stomach/Bowel: Distal esophagus, stomach and duodenum are unremarkable. Much of the proximal small bowel is underdistended. Portion of the small bowel appears to protrude into a fat and fluid containing umbilical hernia but without resulting upstream mechanical obstruction. Surrounding inflammatory changes nonspecific and possibly related to midline port placement for recent robot assisted laparotomy. There is an  associated air and fluid containing, rim enhancing collection extending along the anterior abdominal wall measuring up to 5 x 2.1 x 5 cm, Dick rectally contiguous with the herniated loop of bowel and umbilical hernia/port placement site at the midline (2/54, 6/103). More distal loops of bowel also appear largely decompressed although some mild mural thickening and inflammatory changes particularly towards the level of the terminal ileum are noted. The colon is largely fluid-filled proximally to the level of the sigmoid where more formed stool is noted. Some mild distal colonic/rectosigmoid thickening is nonspecific and possibly reactive to inflammation. Vascular/Lymphatic: Atherosclerotic calcifications within the abdominal aorta and branch vessels. No aneurysm or ectasia. No enlarged abdominopelvic lymph nodes. Reactive adenopathy in the mid abdomen. Reproductive: There are postsurgical changes from radical prostatectomy with heterogeneous, elongated collection in the rectovesicular pouch measuring approximately 4 x 4 by 11 cm in maximal AP by transverse by craniocaudal dimension (6/111, 2/83). Some admixed punctate foci of gas are present. Additional fluid and mixed attenuation material in the space of Retzius and retroperitoneum/sub peritoneal as well as presacral space. Other: Fluid collection adjacent a recurrent umbilical hernia likely at the site  of midline port placement. Additional postsurgical changes of the abdomen at the site of trocar placement in the left lower quadrant and possibly right lateral abdomen as well. Soft tissue gas within the fascial planes along the anterior abdominal wall is nonspecific and can be seen in the setting of recent surgical intervention. Heterogeneous collection in the rectovesicular space, as detailed above. Additional small volume free fluid within the mesenteric leaflets as well. Extensive Perivesicular phlegmonous changes noted, and better detailed above. Musculoskeletal: Multilevel degenerative changes are present in the imaged portions of the spine. No acute osseous abnormality or suspicious osseous lesion. IMPRESSION: 1. Postsurgical changes from radical prostatectomy. 2. A heterogeneous, elongated collection in the rectovesicular space measuring approximately 4 x 4 x 11 cm. Some admixed punctate foci of gas are present. Findings likely reflect a postoperative collection likely containing some admixed hemorrhagic products and gas though developing superinfection is difficult to fully exclude. 3. Irregular thickening and inflammation about the umbilicus with portion of the small bowel appears protruding into a fluid containing umbilical hernia. Operative report does note lysis of adhesion in this vicinity as well as a midline port placement. No resulting mechanical obstruction of the protruding bowel is clearly evident however a nonspecific air and fluid containing collection is seen along the lateral aspect which could reflect a postoperative hematoma or seroma though few nonspecific foci of internal gas could suggest a fistula or early infection. 4. Extensive circumferential bladder wall thickening and perivesicular hazy stranding, small amount of fluid seen along the anterior bladder is well, contained within the Space of Retzius. Correlate with urinalysis to exclude a superimposed cystitis versus reactive  inflammatory change at the operative site. 5. Some mild distal colonic/rectosigmoid thickening is nonspecific and possibly reactive to inflammation/postsurgical change. 6. Hepatic steatosis. 7. Aortic Atherosclerosis (ICD10-I70.0). These results were called by telephone at the time of interpretation on 03/12/2020 at 10:38 pm to provider Kynslei Art , who verbally acknowledged these results. Electronically Signed   By: Lovena Le M.D.   On: 03/12/2020 22:38    Procedures Procedures (including critical care time)  Medications Ordered in ED Medications  sodium chloride (PF) 0.9 % injection (has no administration in time range)  cefTRIAXone (ROCEPHIN) 1 g in sodium chloride 0.9 % 100 mL IVPB (has no administration in time range)  0.9 %  sodium chloride infusion (  has no administration in time range)  cefTRIAXone (ROCEPHIN) 1 g in sodium chloride 0.9 % 100 mL IVPB (has no administration in time range)  acetaminophen (TYLENOL) tablet 650 mg (has no administration in time range)  HYDROcodone-acetaminophen (NORCO/VICODIN) 5-325 MG per tablet 1-2 tablet (has no administration in time range)  morphine 2 MG/ML injection 2-4 mg (has no administration in time range)  oxybutynin (DITROPAN) tablet 5 mg (has no administration in time range)  opium-belladonna (B&O) suppository 16.2-60mg  (has no administration in time range)  diphenhydrAMINE (BENADRYL) injection 12.5-25 mg (has no administration in time range)    Or  diphenhydrAMINE (BENADRYL) 12.5 MG/5ML elixir 12.5-25 mg (has no administration in time range)  polyethylene glycol (MIRALAX / GLYCOLAX) packet 17 g (has no administration in time range)  ondansetron (ZOFRAN) injection 4 mg (has no administration in time range)  morphine 4 MG/ML injection 4 mg (4 mg Intravenous Given 03/12/20 2043)  ondansetron (ZOFRAN) injection 4 mg (4 mg Intravenous Given 03/12/20 2042)  iohexol (OMNIPAQUE) 300 MG/ML solution 100 mL (100 mLs Intravenous Contrast Given 03/12/20 2143)     ED Course  I have reviewed the triage vital signs and the nursing notes.  Pertinent labs & imaging results that were available during my care of the patient were reviewed by me and considered in my medical decision making (see chart for details).    MDM Rules/Calculators/A&P                          59 yo M with a chief complaints of crampy abdominal pain and constipation.  Going on for a couple weeks but worsening over the past few days.  As the patient just had abdominal surgery will obtain a CT scan of the abdomen pelvis.  Lab work.  Reassess.  Significant leukocytosis of 17,000.  Patient had an issue with bleeding post surgery but this seems to have resolved as his hemoglobin is gone from 8-10.  CT with concern for fluid collection in the pelvis.  Also signs of possible violation of his prior umbilical hernia.  I discussed this first with the radiologist.  Then  I discussed this with the urologist on-call, Dr. Lovena Neighbours.  He independently reviewed the patient's CT imaging and will place the patient into the hospital and they will evaluate him in the morning.  Recommended me giving him a dose of Rocephin.  The patients results and plan were reviewed and discussed.   Any x-rays performed were independently reviewed by myself.   Differential diagnosis were considered with the presenting HPI.  Medications  sodium chloride (PF) 0.9 % injection (has no administration in time range)  cefTRIAXone (ROCEPHIN) 1 g in sodium chloride 0.9 % 100 mL IVPB (has no administration in time range)  0.9 %  sodium chloride infusion (has no administration in time range)  cefTRIAXone (ROCEPHIN) 1 g in sodium chloride 0.9 % 100 mL IVPB (has no administration in time range)  acetaminophen (TYLENOL) tablet 650 mg (has no administration in time range)  HYDROcodone-acetaminophen (NORCO/VICODIN) 5-325 MG per tablet 1-2 tablet (has no administration in time range)  morphine 2 MG/ML injection 2-4 mg (has no  administration in time range)  oxybutynin (DITROPAN) tablet 5 mg (has no administration in time range)  opium-belladonna (B&O) suppository 16.2-60mg  (has no administration in time range)  diphenhydrAMINE (BENADRYL) injection 12.5-25 mg (has no administration in time range)    Or  diphenhydrAMINE (BENADRYL) 12.5 MG/5ML elixir 12.5-25 mg (has no administration  in time range)  polyethylene glycol (MIRALAX / GLYCOLAX) packet 17 g (has no administration in time range)  ondansetron (ZOFRAN) injection 4 mg (has no administration in time range)  morphine 4 MG/ML injection 4 mg (4 mg Intravenous Given 03/12/20 2043)  ondansetron (ZOFRAN) injection 4 mg (4 mg Intravenous Given 03/12/20 2042)  iohexol (OMNIPAQUE) 300 MG/ML solution 100 mL (100 mLs Intravenous Contrast Given 03/12/20 2143)    Vitals:   03/12/20 2027 03/12/20 2029 03/12/20 2200  BP: (!) 160/99  (!) 168/107  Pulse: 102  (!) 107  Resp: 14    Temp: 99.3 F (37.4 C)    TempSrc: Oral    SpO2: 96%  97%  Weight:  (!) 111.6 kg   Height:  6' 1.5" (1.867 m)     Final diagnoses:  Abdominal pain, unspecified abdominal location    Admission/ observation were discussed with the admitting physician, patient and/or family and they are comfortable with the plan.   Final Clinical Impression(s) / ED Diagnoses Final diagnoses:  Abdominal pain, unspecified abdominal location    Rx / DC Orders ED Discharge Orders    None       Deno Etienne, DO 03/12/20 2306

## 2020-03-12 NOTE — ED Notes (Signed)
Patient transported to CT 

## 2020-03-12 NOTE — ED Triage Notes (Signed)
Pt presents by [facility] for evaluation of abdominal pain and constipation for the last several days. Pt was recently discharged from hospital after having laproscopic prostatectomy. Pt reports not having bowel movement in the last week. Denies any vomiting.

## 2020-03-13 DIAGNOSIS — K219 Gastro-esophageal reflux disease without esophagitis: Secondary | ICD-10-CM | POA: Diagnosis present

## 2020-03-13 DIAGNOSIS — Z20822 Contact with and (suspected) exposure to covid-19: Secondary | ICD-10-CM | POA: Diagnosis present

## 2020-03-13 DIAGNOSIS — R109 Unspecified abdominal pain: Secondary | ICD-10-CM | POA: Diagnosis present

## 2020-03-13 DIAGNOSIS — K567 Ileus, unspecified: Secondary | ICD-10-CM | POA: Diagnosis present

## 2020-03-13 DIAGNOSIS — Z9079 Acquired absence of other genital organ(s): Secondary | ICD-10-CM | POA: Diagnosis not present

## 2020-03-13 DIAGNOSIS — K9189 Other postprocedural complications and disorders of digestive system: Secondary | ICD-10-CM | POA: Diagnosis present

## 2020-03-13 DIAGNOSIS — C61 Malignant neoplasm of prostate: Secondary | ICD-10-CM | POA: Diagnosis present

## 2020-03-13 DIAGNOSIS — I10 Essential (primary) hypertension: Secondary | ICD-10-CM | POA: Diagnosis present

## 2020-03-13 DIAGNOSIS — R188 Other ascites: Secondary | ICD-10-CM | POA: Diagnosis present

## 2020-03-13 DIAGNOSIS — Z79899 Other long term (current) drug therapy: Secondary | ICD-10-CM | POA: Diagnosis not present

## 2020-03-13 DIAGNOSIS — Y838 Other surgical procedures as the cause of abnormal reaction of the patient, or of later complication, without mention of misadventure at the time of the procedure: Secondary | ICD-10-CM | POA: Diagnosis present

## 2020-03-13 DIAGNOSIS — Z87891 Personal history of nicotine dependence: Secondary | ICD-10-CM | POA: Diagnosis not present

## 2020-03-13 DIAGNOSIS — R32 Unspecified urinary incontinence: Secondary | ICD-10-CM | POA: Diagnosis not present

## 2020-03-13 LAB — BASIC METABOLIC PANEL
Anion gap: 8 (ref 5–15)
BUN: 12 mg/dL (ref 6–20)
CO2: 26 mmol/L (ref 22–32)
Calcium: 7.9 mg/dL — ABNORMAL LOW (ref 8.9–10.3)
Chloride: 101 mmol/L (ref 98–111)
Creatinine, Ser: 1.01 mg/dL (ref 0.61–1.24)
GFR calc Af Amer: >60 mL/min (ref >60)
GFR calc non Af Amer: >60 mL/min (ref >60)
Glucose, Bld: 119 mg/dL — ABNORMAL HIGH (ref 70–99)
Potassium: 4.2 mmol/L (ref 3.5–5.1)
Sodium: 135 mmol/L (ref 135–145)

## 2020-03-13 LAB — CBC
HCT: 29.5 % — ABNORMAL LOW (ref 39.0–52.0)
Hemoglobin: 9.6 g/dL — ABNORMAL LOW (ref 13.0–17.0)
MCH: 27.3 pg (ref 26.0–34.0)
MCHC: 32.5 g/dL (ref 30.0–36.0)
MCV: 83.8 fL (ref 80.0–100.0)
Platelets: 281 10*3/uL (ref 150–400)
RBC: 3.52 MIL/uL — ABNORMAL LOW (ref 4.22–5.81)
RDW: 14.6 % (ref 11.5–15.5)
WBC: 15.5 10*3/uL — ABNORMAL HIGH (ref 4.0–10.5)
nRBC: 0 % (ref 0.0–0.2)

## 2020-03-13 LAB — SARS CORONAVIRUS 2 BY RT PCR (HOSPITAL ORDER, PERFORMED IN ~~LOC~~ HOSPITAL LAB): SARS Coronavirus 2: NEGATIVE

## 2020-03-13 MED ORDER — HYDROCHLOROTHIAZIDE 12.5 MG PO CAPS
12.5000 mg | ORAL_CAPSULE | Freq: Every day | ORAL | Status: DC
  Administered 2020-03-13 – 2020-03-15 (×3): 12.5 mg via ORAL
  Filled 2020-03-13 (×3): qty 1

## 2020-03-13 MED ORDER — POLYETHYLENE GLYCOL 3350 17 G PO PACK
17.0000 g | PACK | Freq: Every day | ORAL | Status: DC
  Administered 2020-03-13 – 2020-03-15 (×3): 17 g via ORAL
  Filled 2020-03-13 (×3): qty 1

## 2020-03-13 MED ORDER — CHLORHEXIDINE GLUCONATE CLOTH 2 % EX PADS
6.0000 | MEDICATED_PAD | Freq: Every day | CUTANEOUS | Status: DC
  Administered 2020-03-14: 6 via TOPICAL

## 2020-03-13 MED ORDER — LISINOPRIL 20 MG PO TABS
20.0000 mg | ORAL_TABLET | Freq: Every day | ORAL | Status: DC
  Administered 2020-03-13 – 2020-03-15 (×3): 20 mg via ORAL
  Filled 2020-03-13 (×2): qty 1
  Filled 2020-03-13: qty 2

## 2020-03-13 NOTE — ED Notes (Signed)
Meal tray placed in room. RN will provide more nourishment later if requested. Patient and wife resting at this time and RN did not want to wake.

## 2020-03-13 NOTE — ED Notes (Signed)
Pt rested uneventfully in hospital bed with spouse at bedside. Pt and family informed of delay on room assignment.

## 2020-03-13 NOTE — ED Notes (Signed)
Called transport to take pt and all belongings upstairs. Nurse upstairs unable to take report at this time, completing shift change will call back in 10 min

## 2020-03-13 NOTE — H&P (Signed)
H&P  Chief Complaint: Abdominal pain  History of Present Illness: Jonathan Edwards is a 59 y.o. year old male status post robotic prostatectomy with Dr. Gloriann Loan on 03/06/2020.  The patient had acute blood loss anemia postoperatively that required multiple units of PRBCs.  The patient presented to the emergency room with worsening lower abdominal pain and reports a lack of bowel movement for the past couple of weeks.  He states that his pain is progressively worsened since being discharged from the hospital and denies any specific alleviating or aggravating factors.  He is passing flatus sporadically.  He denies nausea/vomiting or fever/chills at home.  He does note constant low volume bloody drainage from his periumbilical incision.  CT abdomen and pelvis performed in the emergency department revealed a pelvic hematoma as well as an air-fluid level in his previously noted umbilical hernia.  H&H has improved since discharge, but his white count is slightly elevated.  Past Medical History:  Diagnosis Date   Cancer [facility])    prostate   GERD (gastroesophageal reflux disease)    Hypertension    Pneumonia    Tuberculosis    + ppd test  when 59 years old never had any symptoms    Past Surgical History:  Procedure Laterality Date   EYE SURGERY     hole repair left eye and detached retina surgery   HERNIA REPAIR     umbilical with mesh   LAPAROSCOPIC LYSIS OF ADHESIONS N/A 03/06/2020   Procedure: LAPAROSCOPIC LYSIS OF ADHESIONS;  Surgeon: Lucas Mallow, MD;  Location: [facility];  Service: Urology;  Laterality: N/A;   PELVIC LYMPH NODE DISSECTION Bilateral 03/06/2020   Procedure: BILATERAL PELVIC LYMPH NODE DISSECTION;  Surgeon: Lucas Mallow, MD;  Location: [facility];  Service: Urology;  Laterality: Bilateral;   ROBOT ASSISTED LAPAROSCOPIC RADICAL PROSTATECTOMY N/A 03/06/2020   Procedure: XI ROBOTIC ASSISTED LAPAROSCOPIC RADICAL PROSTATECTOMY;  Surgeon: Lucas Mallow, MD;  Location: [facility];   Service: Urology;  Laterality: N/A;   TONSILLECTOMY      Home Medications:  Current Meds  Medication Sig   acetaminophen (TYLENOL) 325 MG tablet Take 650 mg by mouth every 6 (six) hours as needed for moderate pain or headache.   cyclobenzaprine (FLEXERIL) 10 MG tablet Take 10 mg by mouth 3 (three) times daily as needed for muscle spasms.   HYDROcodone-acetaminophen (NORCO) 5-325 MG tablet Take 1-2 tablets by mouth every 6 (six) hours as needed for moderate pain.   lisinopril-hydrochlorothiazide (ZESTORETIC) 20-12.5 MG tablet TAKE 1 TABLET BY MOUTH EVERY DAY (Patient taking differently: Take 1 tablet by mouth daily. )   magnesium citrate SOLN Take 0.5 Bottles by mouth See admin instructions. 1/2 bottle today at 11:30am and 1/2 bottle today at 3:30pm   Multiple Vitamin (MULTIVITAMIN WITH MINERALS) TABS tablet Take 1 tablet by mouth daily.   naproxen sodium (ALEVE) 220 MG tablet Take 440 mg by mouth daily as needed (pain).   Omega-3 Fatty Acids (OMEGA 3 PO) Take 1 capsule by mouth daily.   prednisoLONE acetate (PRED FORTE) 1 % ophthalmic suspension Place 1 drop into the left eye daily.   Probiotic CAPS Take 1 capsule by mouth daily.   tadalafil (CIALIS) 10 MG tablet Take 1 tablet (10 mg total) by mouth daily as needed for erectile dysfunction.   VITAMIN D PO Take 1 capsule by mouth daily.    Allergies: No Known Allergies  History reviewed. No pertinent family history.  Social History:  reports that  he quit smoking about 21 years ago. He quit after 30.00 years of use. He has never used smokeless tobacco. He reports current alcohol use. He reports that he does not use drugs.  ROS: A complete review of systems was performed.  All systems are negative except for pertinent findings as noted.  Physical Exam:  Vital signs in last 24 hours: Temp:  [99.3 F (37.4 C)] 99.3 F (37.4 C) (08/01 2027) Pulse Rate:  [98-107] 104 (08/02 0600) Resp:  [13-22] 13 (08/02 0600) BP: (122-168)/(78-107)  125/78 (08/02 0600) SpO2:  [94 %-97 %] 95 % (08/02 0600) Weight:  [111.6 kg] 111.6 kg (08/01 2029) Constitutional:  Alert and oriented, No acute distress Cardiovascular: Regular rate and rhythm, No JVD Respiratory: Normal respiratory effort, Lungs clear bilaterally GI: Moderately distended and tender to palpation in the periumbilical region.  His periumbilical incision is draining a small amount of dark blood.  GU: No CVA tenderness, Foley in place and draining yellow urine Lymphatic: No lymphadenopathy Neurologic: Grossly intact, no focal deficits Psychiatric: Normal mood and affect   Laboratory Data:  Recent Labs    03/12/20 2041 03/13/20 0417  WBC 17.4* 15.5*  HGB 10.1* 9.6*  HCT 31.1* 29.5*  PLT 282 281    Recent Labs    03/12/20 2041 03/12/20 2049 03/13/20 0417  NA 135  --  135  K 4.3  --  4.2  CL 99  --  101  GLUCOSE 139*  --  119*  BUN 12  --  12  CALCIUM 8.2*  --  7.9*  CREATININE 1.20 1.10 1.01     Results for orders placed or performed during the hospital encounter of 03/12/20 (from the past 24 hour(s))  CBC with Differential     Status: Abnormal   Collection Time: 03/12/20  8:41 PM  Result Value Ref Range   WBC 17.4 (H) 4.0 - 10.5 K/uL   RBC 3.72 (L) 4.22 - 5.81 MIL/uL   Hemoglobin 10.1 (L) 13.0 - 17.0 g/dL   HCT 31.1 (L) 39 - 52 %   MCV 83.6 80.0 - 100.0 fL   MCH 27.2 26.0 - 34.0 pg   MCHC 32.5 30.0 - 36.0 g/dL   RDW 14.6 11.5 - 15.5 %   Platelets 282 150 - 400 K/uL   nRBC 0.0 0.0 - 0.2 %   Neutrophils Relative % 81 %   Neutro Abs 14.3 (H) 1.7 - 7.7 K/uL   Lymphocytes Relative 8 %   Lymphs Abs 1.3 0.7 - 4.0 K/uL   Monocytes Relative 10 %   Monocytes Absolute 1.7 (H) 0 - 1 K/uL   Eosinophils Relative 0 %   Eosinophils Absolute 0.0 0 - 0 K/uL   Basophils Relative 0 %   Basophils Absolute 0.0 0 - 0 K/uL   Immature Granulocytes 1 %   Abs Immature Granulocytes 0.15 (H) 0.00 - 0.07 K/uL  Comprehensive metabolic panel     Status: Abnormal    Collection Time: 03/12/20  8:41 PM  Result Value Ref Range   Sodium 135 135 - 145 mmol/L   Potassium 4.3 3.5 - 5.1 mmol/L   Chloride 99 98 - 111 mmol/L   CO2 25 22 - 32 mmol/L   Glucose, Bld 139 (H) 70 - 99 mg/dL   BUN 12 6 - 20 mg/dL   Creatinine, Ser 1.20 0.61 - 1.24 mg/dL   Calcium 8.2 (L) 8.9 - 10.3 mg/dL   Total Protein 6.7 6.5 - 8.1 g/dL   Albumin  3.7 3.5 - 5.0 g/dL   AST 31 15 - 41 U/L   ALT 31 0 - 44 U/L   Alkaline Phosphatase 44 38 - 126 U/L   Total Bilirubin 1.1 0.3 - 1.2 mg/dL   GFR calc non Af Amer >60 >60 mL/min   GFR calc Af Amer >60 >60 mL/min   Anion gap 11 5 - 15  Lipase, blood     Status: None   Collection Time: 03/12/20  8:41 PM  Result Value Ref Range   Lipase 28 11 - 51 U/L  I-Stat Creatinine, ED (not at [facility])     Status: None   Collection Time: 03/12/20  8:49 PM  Result Value Ref Range   Creatinine, Ser 1.10 0.61 - 1.24 mg/dL  SARS Coronavirus 2 by RT PCR (hospital order, performed in [facility] hospital lab) Nasopharyngeal Nasopharyngeal Swab     Status: None   Collection Time: 03/13/20  4:17 AM   Specimen: Nasopharyngeal Swab  Result Value Ref Range   SARS Coronavirus 2 NEGATIVE NEGATIVE  CBC     Status: Abnormal   Collection Time: 03/13/20  4:17 AM  Result Value Ref Range   WBC 15.5 (H) 4.0 - 10.5 K/uL   RBC 3.52 (L) 4.22 - 5.81 MIL/uL   Hemoglobin 9.6 (L) 13.0 - 17.0 g/dL   HCT 29.5 (L) 39 - 52 %   MCV 83.8 80.0 - 100.0 fL   MCH 27.3 26.0 - 34.0 pg   MCHC 32.5 30.0 - 36.0 g/dL   RDW 14.6 11.5 - 15.5 %   Platelets 281 150 - 400 K/uL   nRBC 0.0 0.0 - 0.2 %  Basic metabolic panel     Status: Abnormal   Collection Time: 03/13/20  4:17 AM  Result Value Ref Range   Sodium 135 135 - 145 mmol/L   Potassium 4.2 3.5 - 5.1 mmol/L   Chloride 101 98 - 111 mmol/L   CO2 26 22 - 32 mmol/L   Glucose, Bld 119 (H) 70 - 99 mg/dL   BUN 12 6 - 20 mg/dL   Creatinine, Ser 1.01 0.61 - 1.24 mg/dL   Calcium 7.9 (L) 8.9 - 10.3 mg/dL   GFR calc non Af Amer >60 >60  mL/min   GFR calc Af Amer >60 >60 mL/min   Anion gap 8 5 - 15   Recent Results (from the past 240 hour(s))  SARS Coronavirus 2 by RT PCR (hospital order, performed in [facility] hospital lab) Nasopharyngeal Nasopharyngeal Swab     Status: None   Collection Time: 03/13/20  4:17 AM   Specimen: Nasopharyngeal Swab  Result Value Ref Range Status   SARS Coronavirus 2 NEGATIVE NEGATIVE Final    Comment: (NOTE) SARS-CoV-2 target nucleic acids are NOT DETECTED.  The SARS-CoV-2 RNA is generally detectable in upper and lower respiratory specimens during the acute phase of infection. The lowest concentration of SARS-CoV-2 viral copies this assay can detect is 250 copies / mL. A negative result does not preclude SARS-CoV-2 infection and should not be used as the sole basis for treatment or other patient management decisions.  A negative result may occur with improper specimen collection / handling, submission of specimen other than nasopharyngeal swab, presence of viral mutation(s) within the areas targeted by this assay, and inadequate number of viral copies (<250 copies / mL). A negative result must be combined with clinical observations, patient history, and epidemiological information.  Fact Sheet for Patients:   StrictlyIdeas.no  Fact Sheet  for Healthcare Providers: BankingDealers.co.za  This test is not yet approved or  cleared by the [place] and has been authorized for detection and/or diagnosis of SARS-CoV-2 by FDA under an Emergency Use Authorization (EUA).  This EUA will remain in effect (meaning this test can be used) for the duration of the COVID-19 declaration under Section 564(b)(1) of the Act, 21 U.S.C. section 360bbb-3(b)(1), unless the authorization is terminated or revoked sooner.  Performed at [facility], [place] [address], [place],  [address]     Renal Function: Recent Labs     03/07/20 0031 03/08/20 0840 03/12/20 2041 03/12/20 2049 03/13/20 0417  CREATININE 1.17 1.47* 1.20 1.10 1.01   Estimated Creatinine Clearance: 102.6 mL/min (by C-G formula based on SCr of 1.01 mg/dL).  Radiologic Imaging: CT ABDOMEN PELVIS W CONTRAST  Result Date: 03/12/2020 CLINICAL DATA:  Abdominal pain, recent laparoscopic soft robot assisted radical prostatectomy performed 03/06/2020 EXAM: CT ABDOMEN AND PELVIS WITH CONTRAST TECHNIQUE: Multidetector CT imaging of the abdomen and pelvis was performed using the standard protocol following bolus administration of intravenous contrast. CONTRAST:  151mL OMNIPAQUE IOHEXOL 300 MG/ML  SOLN COMPARISON:  CT abdomen pelvis 05/11/2009 FINDINGS: Lower chest: Lung bases are clear. Normal heart size. No pericardial effusion. Hepatobiliary: Diffuse hepatic hypoattenuation compatible with hepatic steatosis. Few scattered subcentimeter hyper 2 foci in the liver too small to fully characterize on CT imaging but statistically likely benign. No focal concerning liver lesions. Smooth liver surface contour. Mild gallbladder distention without evidence of pericholecystic fluid or inflammation. No biliary ductal dilatation or visible intraductal gallstones. Pancreas: Unremarkable. No pancreatic ductal dilatation or surrounding inflammatory changes. Spleen: Normal in size. No concerning splenic lesions. Adrenals/Urinary Tract: Normal adrenal glands. Kidneys enhance and excrete uniformly. No concerning renal mass, urolithiasis or hydronephrosis. Urinary bladder partially decompressed by inflated Foley catheter. There is extensive circumferential bladder wall thickening and perivesicular hazy stranding, small amount of fluid seen along the anterior bladder is well, contained within the space of Retzius. Postsurgical changes about the level of the prostate and prostatic urethra. Stomach/Bowel: Distal esophagus, stomach and duodenum are unremarkable. Much of the proximal small  bowel is underdistended. Portion of the small bowel appears to protrude into a fat and fluid containing umbilical hernia but without resulting upstream mechanical obstruction. Surrounding inflammatory changes nonspecific and possibly related to midline port placement for recent robot assisted laparotomy. There is an associated air and fluid containing, rim enhancing collection extending along the anterior abdominal wall measuring up to 5 x 2.1 x 5 cm, Dick rectally contiguous with the herniated loop of bowel and umbilical hernia/port placement site at the midline (2/54, 6/103). More distal loops of bowel also appear largely decompressed although some mild mural thickening and inflammatory changes particularly towards the level of the terminal ileum are noted. The colon is largely fluid-filled proximally to the level of the sigmoid where more formed stool is noted. Some mild distal colonic/rectosigmoid thickening is nonspecific and possibly reactive to inflammation. Vascular/Lymphatic: Atherosclerotic calcifications within the abdominal aorta and branch vessels. No aneurysm or ectasia. No enlarged abdominopelvic lymph nodes. Reactive adenopathy in the mid abdomen. Reproductive: There are postsurgical changes from radical prostatectomy with heterogeneous, elongated collection in the rectovesicular pouch measuring approximately 4 x 4 by 11 cm in maximal AP by transverse by craniocaudal dimension (6/111, 2/83). Some admixed punctate foci of gas are present. Additional fluid and mixed attenuation material in the space of Retzius and retroperitoneum/sub peritoneal as well as presacral space. Other: Fluid collection  adjacent a recurrent umbilical hernia likely at the site of midline port placement. Additional postsurgical changes of the abdomen at the site of trocar placement in the left lower quadrant and possibly right lateral abdomen as well. Soft tissue gas within the fascial planes along the anterior abdominal wall is  nonspecific and can be seen in the setting of recent surgical intervention. Heterogeneous collection in the rectovesicular space, as detailed above. Additional small volume free fluid within the mesenteric leaflets as well. Extensive Perivesicular phlegmonous changes noted, and better detailed above. Musculoskeletal: Multilevel degenerative changes are present in the imaged portions of the spine. No acute osseous abnormality or suspicious osseous lesion. IMPRESSION: 1. Postsurgical changes from radical prostatectomy. 2. A heterogeneous, elongated collection in the rectovesicular space measuring approximately 4 x 4 x 11 cm. Some admixed punctate foci of gas are present. Findings likely reflect a postoperative collection likely containing some admixed hemorrhagic products and gas though developing superinfection is difficult to fully exclude. 3. Irregular thickening and inflammation about the umbilicus with portion of the small bowel appears protruding into a fluid containing umbilical hernia. Operative report does note lysis of adhesion in this vicinity as well as a midline port placement. No resulting mechanical obstruction of the protruding bowel is clearly evident however a nonspecific air and fluid containing collection is seen along the lateral aspect which could reflect a postoperative hematoma or seroma though few nonspecific foci of internal gas could suggest a fistula or early infection. 4. Extensive circumferential bladder wall thickening and perivesicular hazy stranding, small amount of fluid seen along the anterior bladder is well, contained within the Space of Retzius. Correlate with urinalysis to exclude a superimposed cystitis versus reactive inflammatory change at the operative site. 5. Some mild distal colonic/rectosigmoid thickening is nonspecific and possibly reactive to inflammation/postsurgical change. 6. Hepatic steatosis. 7. Aortic Atherosclerosis (ICD10-I70.0). These results were called by  telephone at the time of interpretation on 03/12/2020 at 10:38 pm to provider DAN FLOYD , who verbally acknowledged these results. Electronically Signed   By: Lovena Le M.D.   On: 03/12/2020 22:38    Assessment:  59 year old male postop day 7 status post robotic prostatectomy with lysis of adhesions with worsening abdominal pain, stable anemia, constipation and possible ileus.  Plan:  -Continue clear liquid diet -Bowel stim -Out of bed to chair and ambulate -We will review CT with Dr. Gloriann Loan, especially concerning the periumbilical process.  General surgery input may be helpful.    Ellison Hughs, MD 03/13/2020, 7:05 AM  [facility] Pager: [phone]

## 2020-03-13 NOTE — ED Notes (Signed)
Per floor request delay transport until 1950

## 2020-03-14 LAB — BASIC METABOLIC PANEL
Anion gap: 8 (ref 5–15)
BUN: 14 mg/dL (ref 6–20)
CO2: 27 mmol/L (ref 22–32)
Calcium: 7.9 mg/dL — ABNORMAL LOW (ref 8.9–10.3)
Chloride: 99 mmol/L (ref 98–111)
Creatinine, Ser: 1.03 mg/dL (ref 0.61–1.24)
GFR calc Af Amer: >60 mL/min (ref >60)
GFR calc non Af Amer: >60 mL/min (ref >60)
Glucose, Bld: 125 mg/dL — ABNORMAL HIGH (ref 70–99)
Potassium: 4.1 mmol/L (ref 3.5–5.1)
Sodium: 134 mmol/L — ABNORMAL LOW (ref 135–145)

## 2020-03-14 LAB — CBC
HCT: 29.8 % — ABNORMAL LOW (ref 39.0–52.0)
Hemoglobin: 9.3 g/dL — ABNORMAL LOW (ref 13.0–17.0)
MCH: 26.9 pg (ref 26.0–34.0)
MCHC: 31.2 g/dL (ref 30.0–36.0)
MCV: 86.1 fL (ref 80.0–100.0)
Platelets: 293 10*3/uL (ref 150–400)
RBC: 3.46 MIL/uL — ABNORMAL LOW (ref 4.22–5.81)
RDW: 14.6 % (ref 11.5–15.5)
WBC: 12.5 10*3/uL — ABNORMAL HIGH (ref 4.0–10.5)
nRBC: 0 % (ref 0.0–0.2)

## 2020-03-14 MED ORDER — BISACODYL 10 MG RE SUPP
10.0000 mg | Freq: Once | RECTAL | Status: DC
  Filled 2020-03-14: qty 1

## 2020-03-14 MED ORDER — HEPARIN SODIUM (PORCINE) 5000 UNIT/ML IJ SOLN
5000.0000 [IU] | Freq: Three times a day (TID) | INTRAMUSCULAR | Status: DC
  Administered 2020-03-14 – 2020-03-15 (×3): 5000 [IU] via SUBCUTANEOUS
  Filled 2020-03-14 (×3): qty 1

## 2020-03-14 NOTE — Evaluation (Signed)
Occupational Therapy Evaluation Patient Details Name: Jonathan Edwards MRN: [id number] DOB: 06-Nov-1960 Today's Date: 03/14/2020    History of Present Illness 59 y.o. year old male status post robotic prostatectomy with Dr. Gloriann Loan on 03/06/2020. Patient returned to ED 8/1 emergency room with worsening lower abdominal pain and reports a lack of bowel movement for the past couple of weeks. Patient diagnosed with pelvic hematoma and possible ileus   Clinical Impression   Patient with functional deficits listed below impacting safety and independence with self care. Patient require min A for bed mobility, educate patient and spouse on log roll technique for increased comfort to abdomen. Patient min A to take few steps to sink with wise base of support, min G sink side to perform grooming and hygiene. Educate patient in gradually increasing activity at home to improve activity tolerance and strength needed to participate in daily routine. Will continue to follow to facilitate to discharge to venue listed below.     Follow Up Recommendations  No OT follow up;Supervision/Assistance - 24 hour    Equipment Recommendations  Tub/shower seat       Precautions / Restrictions Precautions Precautions: Fall Restrictions Weight Bearing Restrictions: No      Mobility Bed Mobility Overal bed mobility: Needs Assistance Bed Mobility: Supine to Sit     Supine to sit: Min assist     General bed mobility comments: to elevate trunk. educate patient in log roll technique for in/out of bed to minimize discomfort  Transfers Overall transfer level: Needs assistance Equipment used: None Transfers: Sit to/from Stand Sit to Stand: Min assist         General transfer comment: min A for steadying due to wide base of support and reaching out for furniture. recommend walker for further ambulation to maximize safety    Balance Overall balance assessment: Needs assistance Sitting-balance support: Feet  supported Sitting balance-Leahy Scale: Good     Standing balance support: Single extremity supported;During functional activity Standing balance-Leahy Scale: Fair                             ADL either performed or assessed with clinical judgement   ADL Overall ADL's : Needs assistance/impaired     Grooming: Oral care;Wash/dry face;Wash/dry hands;Min guard;Standing   Upper Body Bathing: Set up;Sitting   Lower Body Bathing: Minimal assistance;Sitting/lateral leans;Sit to/from stand   Upper Body Dressing : Set up;Sitting   Lower Body Dressing: Minimal assistance;Sitting/lateral leans;Sit to/from stand Lower Body Dressing Details (indicate cue type and reason): patient is able to reach socks to adjust, min A to complete due to increased discomfort  Toilet Transfer: Ambulation;BSC;Minimal assistance Toilet Transfer Details (indicate cue type and reason): wide base of support to ambulate to sink and back, min A for safety with patient reaching out for furniture to steady Toileting- Clothing Manipulation and Hygiene: Minimal assistance;Sit to/from stand;Sitting/lateral lean       Functional mobility during ADLs: Minimal assistance General ADL Comments: patient requiring increased assistance with self care due to decreased activity tolerance, balance and pain with mobility                  Pertinent Vitals/Pain Pain Assessment: Faces Faces Pain Scale: Hurts a little bit Pain Location: abdomen Pain Descriptors / Indicators: Sore Pain Intervention(s): Monitored during session;Premedicated before session     Hand Dominance Left   Extremity/Trunk Assessment Upper Extremity Assessment Upper Extremity Assessment: Overall WFL for tasks assessed  Lower Extremity Assessment Lower Extremity Assessment: Defer to PT evaluation       Communication Communication Communication: No difficulties   Cognition Arousal/Alertness: Awake/alert Behavior During Therapy: WFL  for tasks assessed/performed Overall Cognitive Status: Within Functional Limits for tasks assessed                                                Home Living Family/patient expects to be discharged to:: Private residence Living Arrangements: Spouse/significant other;Children Available Help at Discharge: Family;Available 24 hours/day Type of Home: House Home Access: Stairs to enter [employer] of Steps: 1   Home Layout: One level     Bathroom Shower/Tub: Walk-in shower;Tub/shower unit   Bathroom Toilet: Handicapped height     Home Equipment: [place] - 2 wheels          Prior Functioning/Environment Level of Independence: Independent                 OT Problem List: Decreased activity tolerance;Impaired balance (sitting and/or standing);Decreased safety awareness;Pain      OT Treatment/Interventions: Self-care/ADL training;Energy conservation;DME and/or AE instruction;Therapeutic activities;Patient/family education;Balance training    OT Goals(Current goals can be found in the care plan section) Acute Rehab OT Goals Patient Stated Goal: feel better OT Goal Formulation: With patient Time For Goal Achievement: 03/28/20 Potential to Achieve Goals: Good  OT Frequency: Min 2X/week    AM-PAC OT "6 Clicks" Daily Activity     Outcome Measure Help from another person eating meals?: None Help from another person taking care of personal grooming?: A Little Help from another person toileting, which includes using toliet, bedpan, or urinal?: A Little Help from another person bathing (including washing, rinsing, drying)?: A Little Help from another person to put on and taking off regular upper body clothing?: A Little Help from another person to put on and taking off regular lower body clothing?: A Little 6 Click Score: 19   End of Session Nurse Communication: Mobility status  Activity Tolerance: Patient tolerated treatment well Patient left:  in chair;with call bell/phone within reach;with family/visitor present  OT Visit Diagnosis: Other abnormalities of gait and mobility (R26.89);Pain Pain - part of body:  (abdominal)                Time: 4315-4008 OT Time Calculation (min): 24 min Charges:  OT General Charges $OT Visit: 1 Visit OT Evaluation $OT Eval Low Complexity: 1 Low OT Treatments $Self Care/Home Management : 8-22 mins  Delbert Phenix OT OT pager: [place] 03/14/2020, 12:12 PM

## 2020-03-14 NOTE — Evaluation (Signed)
Physical Therapy Evaluation Patient Details Name: Jonathan Edwards MRN: [id number] DOB: April 03, 1961 Today's Date: 03/14/2020   History of Present Illness  59 y.o. year old male status post robotic prostatectomy with Dr. Gloriann Loan on 03/06/2020. Patient returned to ED 8/1 emergency room with worsening lower abdominal pain and reports a lack of bowel movement for the past couple of weeks. Patient diagnosed with abd fluid collection and possible ileus    Clinical Impression  On eval, pt was Min guard assist for mobility. He walked ~100 feet with the support of the IV pole. Pt denied lightheadedness. Moderate abd pain with activity. Encouraged pt to walk again later with supervision/assist of nursing or family (with nursing approval). Will follow during hospital stay.     Follow Up Recommendations No PT follow up;Supervision for mobility/OOB    Equipment Recommendations  None recommended by PT    Recommendations for Other Services       Precautions / Restrictions Precautions Precautions: Fall Restrictions Weight Bearing Restrictions: No      Mobility  Bed Mobility Overal bed mobility: Needs Assistance Bed Mobility: Supine to Sit          General bed mobility comments: oob in recliner  Transfers Overall transfer level: Needs assistance Equipment used: None Transfers: Sit to/from Stand Sit to Stand: Min guard          General transfer comment: Increased time. Steadied with support of IV pole  Ambulation/Gait Ambulation/Gait assistance: Min guard Gait Distance (Feet): 100 Feet Assistive device: IV Pole Gait Pattern/deviations: Decreased stride length;Decreased step length - right;Decreased step length - left     General Gait Details: Slow gait speed. Decreased step lengths bilaterally. Pt denied lightheadedness.  Stairs            Wheelchair Mobility    Modified Rankin (Stroke Patients Only)       Balance Overall balance assessment: Needs  assistance Sitting-balance support: Feet supported Sitting balance-Leahy Scale: Good     Standing balance support: Single extremity supported Standing balance-Leahy Scale: Fair                               Pertinent Vitals/Pain Pain Assessment: 0-10 Pain Score: 5  Faces Pain Scale: Hurts a little bit Pain Location: abdomen Pain Descriptors / Indicators: Sore;Tender;Discomfort Pain Intervention(s): Monitored during session;Limited activity within patient's tolerance    Home Living Family/patient expects to be discharged to:: Private residence Living Arrangements: Spouse/significant other;Children Available Help at Discharge: Family;Available 24 hours/day Type of Home: House Home Access: Stairs to enter   [employer] of Steps: 1 Home Layout: One level Home Equipment: Environmental consultant - 2 wheels      Prior Function Level of Independence: Independent               Hand Dominance   Dominant Hand: Left    Extremity/Trunk Assessment   Upper Extremity Assessment Upper Extremity Assessment: Defer to OT evaluation    Lower Extremity Assessment Lower Extremity Assessment: Generalized weakness    Cervical / Trunk Assessment Cervical / Trunk Assessment: Normal  Communication   Communication: No difficulties  Cognition Arousal/Alertness: Awake/alert Behavior During Therapy: WFL for tasks assessed/performed Overall Cognitive Status: Within Functional Limits for tasks assessed                                        General  Comments      Exercises     Assessment/Plan    PT Assessment Patient needs continued PT services  PT Problem List Decreased mobility;Pain;Decreased activity tolerance;Decreased balance;Decreased strength       PT Treatment Interventions DME instruction;Gait training;Therapeutic activities;Therapeutic exercise;Patient/family education;Balance training;Functional mobility training    PT Goals (Current goals  can be found in the Care Plan section)  Acute Rehab PT Goals Patient Stated Goal: feel better PT Goal Formulation: With patient Time For Goal Achievement: 03/28/20 Potential to Achieve Goals: Good    Frequency Min 3X/week   Barriers to discharge        Co-evaluation               AM-PAC PT "6 Clicks" Mobility  Outcome Measure Help needed turning from your back to your side while in a flat bed without using bedrails?: A Little Help needed moving from lying on your back to sitting on the side of a flat bed without using bedrails?: A Little Help needed moving to and from a bed to a chair (including a wheelchair)?: A Little Help needed standing up from a chair using your arms (e.g., wheelchair or bedside chair)?: A Little Help needed to walk in hospital room?: A Little Help needed climbing 3-5 steps with a railing? : A Little 6 Click Score: 18    End of Session   Activity Tolerance: Patient tolerated treatment well Patient left: in chair;with call bell/phone within reach;with family/visitor present   PT Visit Diagnosis: Unsteadiness on feet (R26.81);Pain Pain - part of body:  (abdomen)    Time: 1145-1200 PT Time Calculation (min) (ACUTE ONLY): 15 min   Charges:   PT Evaluation $PT Eval Low Complexity: [place], PT Acute Rehabilitation  Office: [phone] Pager: [phone]

## 2020-03-14 NOTE — Progress Notes (Signed)
  Subjective: Patient reports that he is passing flatus this morning. No BM. Pain controlled with morphine. He has not yet ambulated since arrival. No nausea or emesis. Does feel distended. Having persistent drainage from his midline incision.  625cc urine output recorded overnight. WBC 12.5 from 15.5 Hb 9.3 Cr 1.03  Objective: Vital signs in last 24 hours: Temp:  [98.8 F (37.1 C)-99.5 F (37.5 C)] 99.5 F (37.5 C) (08/03 0437) Pulse Rate:  [89-109] 90 (08/03 0437) Resp:  [17-24] 20 (08/03 0437) BP: (100-115)/(60-101) 100/67 (08/03 0437) SpO2:  [96 %-98 %] 96 % (08/03 0437) Weight:  [108.4 kg] 108.4 kg (08/02 1956)  Intake/Output from previous day: 08/02 0701 - 08/03 0700 In: 1192.4 [P.O.:300; I.V.:792.4; IV Piggyback:100] Out: 625 [Urine:625] Intake/Output this shift: No intake/output data recorded.  Physical Exam:  General: Alert and oriented. CV: RRR Lungs: Clear bilaterally. GI: Soft, distended, non tender.  Incisions: Clean, dry, and intact. Serosanguinous drainage from umbilical incision, no surrounding erythema or edema. No purulence Urine: Dark tea colored Extremities: Nontender, no erythema, no edema.  Lab Results: Recent Labs    03/12/20 2041 03/13/20 0417 03/14/20 0543  HGB 10.1* 9.6* 9.3*  HCT 31.1* 29.5* 29.8*      Assessment/Plan: POD# 8 s/p robotic prostatectomy, with likely ileus and abdominal fluid collection draining via umbilical incision. Low concern for infection/abscess at this time.  1) Continue IVF, clear liquid diet 2) Ambulate, Incentive spirometry 3) Transition to oral pain medication 4) Dulcolax suppository today, continue miralax 5) SCD, SQH while inpatient for DVT ppx    LOS: 1 day   Carmie Kanner 03/14/2020, 9:04 AM

## 2020-03-15 NOTE — Progress Notes (Signed)
Patient discharged home with wife.  IV removed - WNL.  Foley cath removed this AM and patient voiding with incontinence,  Bladder scan shows 34 cc after incont. episode.  Pads provided.  AVS and meds reviewed - will follow up with urology.  No questions at this time, patient in NAD at time of DC.

## 2020-03-15 NOTE — Discharge Summary (Signed)
[facility] Discharge Summary  Admit date: 03/12/2020  Discharge date and time: 03/15/20   Discharge to: Home  Discharge Service: Urology  Discharge Attending Physician:  Dr Link Snuffer  Discharge  Diagnoses: Ileus  Secondary Diagnosis: Active Problems:   Abdominal pain   OR Procedures:  None   Ancillary Procedures: None   Discharge Day Services: The patient was seen and examined by the Urology team both in the morning and immediately prior to discharge.  Vital signs and laboratory values were stable and within normal limits.  The physical exam was benign and unchanged and all surgical wounds were examined.  Discharge instructions were explained and all questions answered.  Subjective  No acute events overnight. Pain Controlled. No fever or chills.  Objective Patient Vitals for the past 8 hrs:  BP Temp Temp src Pulse Resp SpO2  03/15/20 1333 118/74 99.5 F (37.5 C) Oral 93 19 98 %   Total I/O In: 360 [P.O.:360] Out: 1000 [Urine:1000]  General Appearance:        No acute distress Lungs:                       Normal work of breathing on room air Heart:                                Regular rate and rhythm Abdomen:                         Soft, non-tender, non-distended Extremities:                      Warm and well perfused   Hospital Course:  The patient underwent robotic assisted prostatectomy on 03/06/20. He was discharged on 03/10/20. Post operative course was complicated by acute blood loss anemia requiring transfusion. The patient presented back to the ED on 8/2//21 with complaints of abdominal pain and no bowel movement since surgery. CT scan revealed dilated bowel and umbilical fluid collection, not concerning for abscess. He was started on a bowel regimen and suppository. On hospital day 1, he had multiple large bowel movements, and felt much improved. On hospital day 2, he was tolerating a regular diet and ambulating independently. His foley catheter was  removed on 03/15/20. He was incontinent of urine following removal. He was discharged home on 03/15/20 with plans to follow up in one week for post op check.   Condition at Discharge: Improved  Discharge Medications:  Allergies as of 03/15/2020   No Known Allergies     Medication List    STOP taking these medications   sulfamethoxazole-trimethoprim 800-160 MG tablet Commonly known as: BACTRIM DS     TAKE these medications   acetaminophen 325 MG tablet Commonly known as: TYLENOL Take 650 mg by mouth every 6 (six) hours as needed for moderate pain or headache.   cyclobenzaprine 10 MG tablet Commonly known as: FLEXERIL Take 10 mg by mouth 3 (three) times daily as needed for muscle spasms.   HYDROcodone-acetaminophen 5-325 MG tablet Commonly known as: Norco Take 1-2 tablets by mouth every 6 (six) hours as needed for moderate pain.   lisinopril-hydrochlorothiazide 20-12.5 MG tablet Commonly known as: ZESTORETIC TAKE 1 TABLET BY MOUTH EVERY DAY   magnesium citrate Soln Take 0.5 Bottles by mouth See admin instructions. 1/2 bottle today at 11:30am and 1/2 bottle today at 3:30pm  multivitamin with minerals Tabs tablet Take 1 tablet by mouth daily.   naproxen sodium 220 MG tablet Commonly known as: ALEVE Take 440 mg by mouth daily as needed (pain).   OMEGA 3 PO Take 1 capsule by mouth daily.   prednisoLONE acetate 1 % ophthalmic suspension Commonly known as: PRED FORTE Place 1 drop into the left eye daily.   Probiotic Caps Take 1 capsule by mouth daily.   tadalafil 10 MG tablet Commonly known as: Cialis Take 1 tablet (10 mg total) by mouth daily as needed for erectile dysfunction.   VITAMIN D PO Take 1 capsule by mouth daily.

## 2020-03-15 NOTE — Plan of Care (Signed)
°  Problem: Elimination: Goal: Will not experience complications related to urinary retention Outcome: Adequate for Discharge   Patient experiencing incontinence after catheter removal

## 2020-03-20 ENCOUNTER — Ambulatory Visit: Payer: Federal, State, Local not specified - PPO | Admitting: Family Medicine

## 2020-03-23 DIAGNOSIS — C61 Malignant neoplasm of prostate: Secondary | ICD-10-CM | POA: Diagnosis not present

## 2020-03-27 DIAGNOSIS — M6281 Muscle weakness (generalized): Secondary | ICD-10-CM | POA: Diagnosis not present

## 2020-03-27 DIAGNOSIS — N393 Stress incontinence (female) (male): Secondary | ICD-10-CM | POA: Diagnosis not present

## 2020-03-27 DIAGNOSIS — M62838 Other muscle spasm: Secondary | ICD-10-CM | POA: Diagnosis not present

## 2020-04-03 DIAGNOSIS — M6281 Muscle weakness (generalized): Secondary | ICD-10-CM | POA: Diagnosis not present

## 2020-04-03 DIAGNOSIS — M62838 Other muscle spasm: Secondary | ICD-10-CM | POA: Diagnosis not present

## 2020-04-03 DIAGNOSIS — N393 Stress incontinence (female) (male): Secondary | ICD-10-CM | POA: Diagnosis not present

## 2020-04-11 ENCOUNTER — Other Ambulatory Visit: Payer: Self-pay

## 2020-04-12 ENCOUNTER — Encounter: Payer: Self-pay | Admitting: Family Medicine

## 2020-04-12 ENCOUNTER — Ambulatory Visit: Payer: Federal, State, Local not specified - PPO | Admitting: Family Medicine

## 2020-04-12 VITALS — BP 120/80 | HR 91 | Temp 97.2°F | Ht 74.0 in | Wt 234.0 lb

## 2020-04-12 DIAGNOSIS — I10 Essential (primary) hypertension: Secondary | ICD-10-CM | POA: Diagnosis not present

## 2020-04-12 DIAGNOSIS — D649 Anemia, unspecified: Secondary | ICD-10-CM | POA: Diagnosis not present

## 2020-04-12 DIAGNOSIS — E611 Iron deficiency: Secondary | ICD-10-CM

## 2020-04-12 DIAGNOSIS — R202 Paresthesia of skin: Secondary | ICD-10-CM | POA: Diagnosis not present

## 2020-04-12 DIAGNOSIS — R7309 Other abnormal glucose: Secondary | ICD-10-CM | POA: Diagnosis not present

## 2020-04-12 LAB — CBC
HCT: 39.6 % (ref 39.0–52.0)
Hemoglobin: 12.6 g/dL — ABNORMAL LOW (ref 13.0–17.0)
MCHC: 31.8 g/dL (ref 30.0–36.0)
MCV: 78.6 fl (ref 78.0–100.0)
Platelets: 231 10*3/uL (ref 150.0–400.0)
RBC: 5.04 Mil/uL (ref 4.22–5.81)
RDW: 16.1 % — ABNORMAL HIGH (ref 11.5–15.5)
WBC: 7.1 10*3/uL (ref 4.0–10.5)

## 2020-04-12 LAB — BASIC METABOLIC PANEL
BUN: 11 mg/dL (ref 6–23)
CO2: 28 mEq/L (ref 19–32)
Calcium: 9.3 mg/dL (ref 8.4–10.5)
Chloride: 102 mEq/L (ref 96–112)
Creatinine, Ser: 1.13 mg/dL (ref 0.40–1.50)
GFR: 79.85 mL/min (ref >60.00)
Glucose, Bld: 88 mg/dL (ref 70–99)
Potassium: 4.2 mEq/L (ref 3.5–5.1)
Sodium: 138 mEq/L (ref 135–145)

## 2020-04-12 LAB — B12 AND FOLATE PANEL
Folate: >24.8 ng/mL (ref >5.9)
Vitamin B-12: 673 pg/mL (ref 211–911)

## 2020-04-12 LAB — HEMOGLOBIN A1C: Hgb A1c MFr Bld: 5 % (ref 4.6–6.5)

## 2020-04-12 LAB — TSH: TSH: 1.73 u[IU]/mL (ref 0.35–4.50)

## 2020-04-12 NOTE — Progress Notes (Addendum)
Established Patient Office Visit  Subjective:  Patient ID: Jonathan Edwards, male    DOB: 05-Mar-1961  Age: 59 y.o. MRN: [id number]  CC:  Chief Complaint  Patient presents with  . Follow-up    6 month follow up, patient states that sometimes his toes feel numb/tingly     HPI Jonathan Edwards presents for follow-up of blood pressure, anemia, elevated glucose, paresthesias in his toes and follow-up of recent prostatectomy by robotic surgery complicated by hematoma with anemia and ileus.  Doing better.  Still using depends but is in therapy.  Colonoscopy is due in December.  Remains out of work.  Past Medical History:  Diagnosis Date  . Cancer [facility])    prostate  . GERD (gastroesophageal reflux disease)   . Hypertension   . Pneumonia   . Tuberculosis    + ppd test  when 59 years old never had any symptoms    Past Surgical History:  Procedure Laterality Date  . EYE SURGERY     hole repair left eye and detached retina surgery  . HERNIA REPAIR     umbilical with mesh  . LAPAROSCOPIC LYSIS OF ADHESIONS N/A 03/06/2020   Procedure: LAPAROSCOPIC LYSIS OF ADHESIONS;  Surgeon: Lucas Mallow, MD;  Location: [facility];  Service: Urology;  Laterality: N/A;  . PELVIC LYMPH NODE DISSECTION Bilateral 03/06/2020   Procedure: BILATERAL PELVIC LYMPH NODE DISSECTION;  Surgeon: Lucas Mallow, MD;  Location: [facility];  Service: Urology;  Laterality: Bilateral;  . ROBOT ASSISTED LAPAROSCOPIC RADICAL PROSTATECTOMY N/A 03/06/2020   Procedure: XI ROBOTIC ASSISTED LAPAROSCOPIC RADICAL PROSTATECTOMY;  Surgeon: Lucas Mallow, MD;  Location: [facility];  Service: Urology;  Laterality: N/A;  . TONSILLECTOMY      History reviewed. No pertinent family history.  Social History   Socioeconomic History  . Marital status: Married    Spouse name: Not on file  . Number of children: Not on file  . Years of education: Not on file  . Highest education level: Not on file  Occupational History  . Not on file    Tobacco Use  . Smoking status: Former Smoker    Years: 30.00    Quit date: 08/12/1998    Years since quitting: 21.6  . Smokeless tobacco: Never Used  Vaping Use  . Vaping Use: Never used  Substance and Sexual Activity  . Alcohol use: Yes    Comment: occasional  . Drug use: No  . Sexual activity: Yes    Birth control/protection: None  Other Topics Concern  . Not on file  Social History Narrative  . Not on file   Social Determinants of Health   Financial Resource Strain:   . Difficulty of Paying Living Expenses: Not on file  Food Insecurity:   . Worried About Charity fundraiser in the Last Year: Not on file  . Ran Out of Food in the Last Year: Not on file  Transportation Needs:   . Lack of Transportation (Medical): Not on file  . Lack of Transportation (Non-Medical): Not on file  Physical Activity:   . Days of Exercise per Week: Not on file  . Minutes of Exercise per Session: Not on file  Stress:   . Feeling of Stress : Not on file  Social Connections:   . Frequency of Communication with Friends and Family: Not on file  . Frequency of Social Gatherings with Friends and Family: Not on file  . Attends Religious Services: Not on  file  . Active Member of Clubs or Organizations: Not on file  . Attends Archivist Meetings: Not on file  . Marital Status: Not on file  Intimate Partner Violence:   . Fear of Current or Ex-Partner: Not on file  . Emotionally Abused: Not on file  . Physically Abused: Not on file  . Sexually Abused: Not on file    Outpatient Medications Prior to Visit  Medication Sig Dispense Refill  . acetaminophen (TYLENOL) 325 MG tablet Take 650 mg by mouth every 6 (six) hours as needed for moderate pain or headache.    . cyclobenzaprine (FLEXERIL) 10 MG tablet Take 10 mg by mouth 3 (three) times daily as needed for muscle spasms.    Marland Kitchen lisinopril-hydrochlorothiazide (ZESTORETIC) 20-12.5 MG tablet TAKE 1 TABLET BY MOUTH EVERY DAY 90 tablet 0  .  Multiple Vitamin (MULTIVITAMIN WITH MINERALS) TABS tablet Take 1 tablet by mouth daily.    . naproxen sodium (ALEVE) 220 MG tablet Take 440 mg by mouth daily as needed (pain).    . Omega-3 Fatty Acids (OMEGA 3 PO) Take 1 capsule by mouth daily.    . prednisoLONE acetate (PRED FORTE) 1 % ophthalmic suspension Place 1 drop into the left eye daily.    . Probiotic CAPS Take 1 capsule by mouth daily.    Marland Kitchen VITAMIN D PO Take 1 capsule by mouth daily.    Marland Kitchen HYDROcodone-acetaminophen (NORCO) 5-325 MG tablet Take 1-2 tablets by mouth every 6 (six) hours as needed for moderate pain. (Patient not taking: Reported on 04/12/2020) 20 tablet 0  . magnesium citrate SOLN Take 0.5 Bottles by mouth See admin instructions. 1/2 bottle today at 11:30am and 1/2 bottle today at 3:30pm (Patient not taking: Reported on 04/12/2020)    . tadalafil (CIALIS) 10 MG tablet Take 1 tablet (10 mg total) by mouth daily as needed for erectile dysfunction. (Patient not taking: Reported on 04/12/2020) 30 tablet 3   No facility-administered medications prior to visit.    No Known Allergies  ROS Review of Systems  Constitutional: Negative.   HENT: Negative.   Respiratory: Negative.   Cardiovascular: Negative.   Gastrointestinal: Negative.   Genitourinary: Negative.   Musculoskeletal: Negative for gait problem and joint swelling.  Skin: Negative for pallor and rash.  Allergic/Immunologic: Negative for immunocompromised state.  Neurological: Negative for weakness and light-headedness.  Hematological: Does not bruise/bleed easily.  Psychiatric/Behavioral: Negative.       Objective:    Physical Exam Vitals and nursing note reviewed.  Constitutional:      General: He is not in acute distress.    Appearance: Normal appearance. He is not ill-appearing, toxic-appearing or diaphoretic.  HENT:     Head: Normocephalic and atraumatic.     Right Ear: Tympanic membrane, ear canal and external ear normal.     Left Ear: Tympanic membrane,  ear canal and external ear normal.     Mouth/Throat:     Mouth: Mucous membranes are moist.     Pharynx: Oropharynx is clear. No oropharyngeal exudate or posterior oropharyngeal erythema.  Eyes:     General: No scleral icterus.       Right eye: No discharge.        Left eye: No discharge.     Extraocular Movements: Extraocular movements intact.     Conjunctiva/sclera: Conjunctivae normal.     Pupils: Pupils are equal, round, and reactive to light.  Cardiovascular:     Rate and Rhythm: Normal rate and regular rhythm.  Pulses:          Dorsalis pedis pulses are 2+ on the right side.       Posterior tibial pulses are 2+ on the right side.  Pulmonary:     Effort: Pulmonary effort is normal.     Breath sounds: Normal breath sounds.  Abdominal:     General: Bowel sounds are normal. There is no distension.     Palpations: There is no mass.     Tenderness: There is no abdominal tenderness. There is no guarding or rebound.     Hernia: No hernia is present.  Musculoskeletal:     Cervical back: No rigidity or tenderness.     Right lower leg: No edema.     Left lower leg: No edema.  Lymphadenopathy:     Cervical: No cervical adenopathy.  Skin:    Coloration: Skin is not jaundiced.  Neurological:     Mental Status: He is alert and oriented to person, place, and time.  Psychiatric:        Mood and Affect: Mood normal.        Behavior: Behavior normal.    Diabetic Foot Exam - Simple   Simple Foot Form Visual Inspection No deformities, no ulcerations, no other skin breakdown bilaterally: Yes Sensation Testing Intact to touch and monofilament testing bilaterally: Yes Pulse Check Posterior Tibialis and Dorsalis pulse intact bilaterally: Yes Comments     BP 120/80   Pulse 91   Temp (!) 97.2 F (36.2 C) (Tympanic)   Ht 6\' 2"  (1.88 m)   Wt 234 lb (106.1 kg)   SpO2 96%   BMI 30.04 kg/m  Wt Readings from Last 3 Encounters:  04/12/20 234 lb (106.1 kg)  03/13/20 238 lb 15.7 oz  (108.4 kg)  03/06/20 (!) 246 lb (111.6 kg)     Health Maintenance Due  Topic Date Due  . INFLUENZA VACCINE  03/12/2020    There are no preventive care reminders to display for this patient.  Lab Results  Component Value Date   TSH 1.73 04/12/2020   Lab Results  Component Value Date   WBC 7.1 04/12/2020   HGB 12.6 (L) 04/12/2020   HCT 39.6 04/12/2020   MCV 78.6 04/12/2020   PLT 231.0 04/12/2020   Lab Results  Component Value Date   NA 138 04/12/2020   K 4.2 04/12/2020   CO2 28 04/12/2020   GLUCOSE 88 04/12/2020   BUN 11 04/12/2020   CREATININE 1.13 04/12/2020   BILITOT 1.1 03/12/2020   ALKPHOS 44 03/12/2020   AST 31 03/12/2020   ALT 31 03/12/2020   PROT 6.7 03/12/2020   ALBUMIN 3.7 03/12/2020   CALCIUM 9.3 04/12/2020   ANIONGAP 8 03/14/2020   GFR 79.85 04/12/2020   Lab Results  Component Value Date   CHOL 149 09/20/2019   Lab Results  Component Value Date   HDL 37.50 (L) 09/20/2019   Lab Results  Component Value Date   LDLCALC 82 09/20/2019   Lab Results  Component Value Date   TRIG 145.0 09/20/2019   Lab Results  Component Value Date   CHOLHDL 4 09/20/2019   Lab Results  Component Value Date   HGBA1C 5.0 04/12/2020      Assessment & Plan:   Problem List Items Addressed This Visit      Cardiovascular and Mediastinum   Essential hypertension - Primary   Relevant Orders   CBC (Completed)   Basic metabolic panel (Completed)     Other  Elevated glucose   Relevant Orders   Hemoglobin A1c (Completed)   B12 and Folate Panel (Completed)   Basic metabolic panel (Completed)   Anemia   Relevant Medications   Iron, Ferrous Sulfate, 325 (65 Fe) MG TABS   Other Relevant Orders   CBC (Completed)   B12 and Folate Panel (Completed)   TSH (Completed)   Iron, TIBC and Ferritin Panel (Completed)   Paresthesias    Other Visit Diagnoses    Iron deficiency       Relevant Medications   Iron, Ferrous Sulfate, 325 (65 Fe) MG TABS      Meds  ordered this encounter  Medications  . Iron, Ferrous Sulfate, 325 (65 Fe) MG TABS    Sig: Take one twice daily.    Dispense:  180 tablet    Refill:  1    Follow-up: Return in about 3 months (around 07/12/2020).    Libby Maw, MD

## 2020-04-13 LAB — IRON,TIBC AND FERRITIN PANEL
%SAT: 14 % (calc) — ABNORMAL LOW (ref 20–48)
Ferritin: 65 ng/mL (ref 24–380)
Iron: 54 ug/dL (ref 50–180)
TIBC: 375 mcg/dL (calc) (ref 250–425)

## 2020-04-14 MED ORDER — IRON (FERROUS SULFATE) 325 (65 FE) MG PO TABS: ORAL_TABLET | ORAL | 1 refills | Status: DC

## 2020-04-14 NOTE — Addendum Note (Signed)
Addended by: Jon Billings on: 04/14/2020 07:48 AM   Modules accepted: Orders

## 2020-04-18 DIAGNOSIS — M6281 Muscle weakness (generalized): Secondary | ICD-10-CM | POA: Diagnosis not present

## 2020-04-18 DIAGNOSIS — M62838 Other muscle spasm: Secondary | ICD-10-CM | POA: Diagnosis not present

## 2020-04-18 DIAGNOSIS — N393 Stress incontinence (female) (male): Secondary | ICD-10-CM | POA: Diagnosis not present

## 2020-05-01 DIAGNOSIS — C61 Malignant neoplasm of prostate: Secondary | ICD-10-CM | POA: Diagnosis not present

## 2020-05-08 DIAGNOSIS — C61 Malignant neoplasm of prostate: Secondary | ICD-10-CM | POA: Diagnosis not present

## 2020-05-21 ENCOUNTER — Other Ambulatory Visit: Payer: Self-pay | Admitting: Family Medicine

## 2020-05-21 DIAGNOSIS — I10 Essential (primary) hypertension: Secondary | ICD-10-CM

## 2020-05-30 DIAGNOSIS — H43811 Vitreous degeneration, right eye: Secondary | ICD-10-CM | POA: Diagnosis not present

## 2020-05-30 DIAGNOSIS — H35371 Puckering of macula, right eye: Secondary | ICD-10-CM | POA: Diagnosis not present

## 2020-05-30 DIAGNOSIS — H59811 Chorioretinal scars after surgery for detachment, right eye: Secondary | ICD-10-CM | POA: Diagnosis not present

## 2020-05-30 DIAGNOSIS — H35341 Macular cyst, hole, or pseudohole, right eye: Secondary | ICD-10-CM | POA: Diagnosis not present

## 2020-06-16 ENCOUNTER — Ambulatory Visit: Payer: Federal, State, Local not specified - PPO | Attending: Internal Medicine

## 2020-06-16 DIAGNOSIS — Z23 Encounter for immunization: Secondary | ICD-10-CM

## 2020-06-16 NOTE — Progress Notes (Signed)
° °  Covid-19 Vaccination Clinic  Name:  Jonathan Edwards    MRN: [id number] DOB: Dec 15, 1960  06/16/2020  [name] was observed post Covid-19 immunization for 15 minutes without incident. He was provided with Vaccine Information Sheet and instruction to access the V-Safe system.   [name] was instructed to call 911 with any severe reactions post vaccine:  Difficulty breathing   Swelling of face and throat   A fast heartbeat   A bad rash all over body   Dizziness and weakness

## 2020-07-12 ENCOUNTER — Ambulatory Visit: Payer: Federal, State, Local not specified - PPO | Admitting: Family Medicine

## 2020-08-16 DIAGNOSIS — C61 Malignant neoplasm of prostate: Secondary | ICD-10-CM | POA: Diagnosis not present

## 2020-08-18 ENCOUNTER — Ambulatory Visit: Payer: Federal, State, Local not specified - PPO | Admitting: Family Medicine

## 2020-08-23 DIAGNOSIS — N5231 Erectile dysfunction following radical prostatectomy: Secondary | ICD-10-CM | POA: Diagnosis not present

## 2020-08-23 DIAGNOSIS — N393 Stress incontinence (female) (male): Secondary | ICD-10-CM | POA: Diagnosis not present

## 2020-08-23 DIAGNOSIS — C61 Malignant neoplasm of prostate: Secondary | ICD-10-CM | POA: Diagnosis not present

## 2020-09-04 ENCOUNTER — Other Ambulatory Visit: Payer: Self-pay

## 2020-09-04 ENCOUNTER — Telehealth: Payer: Self-pay | Admitting: Family Medicine

## 2020-09-04 DIAGNOSIS — I10 Essential (primary) hypertension: Secondary | ICD-10-CM

## 2020-09-04 MED ORDER — LISINOPRIL-HYDROCHLOROTHIAZIDE 20-12.5 MG PO TABS: 1.0000 | ORAL_TABLET | Freq: Every day | ORAL | 0 refills | Status: DC

## 2020-09-04 NOTE — Telephone Encounter (Signed)
Rx sent in called patient to inform. No answer LMTCB

## 2020-09-04 NOTE — Telephone Encounter (Signed)
Patient is calling to get a refill on his Lisinopril. Appointment with Dr. Ethelene Hal was canceled/rescheduled and he does not have enough to last until appointment in March. If approved, please send to [facility] on [address] and call him to let him know it has been sent in. He would like a 90 day supply.

## 2020-09-12 ENCOUNTER — Ambulatory Visit: Payer: Federal, State, Local not specified - PPO | Admitting: Family Medicine

## 2020-10-18 ENCOUNTER — Other Ambulatory Visit: Payer: Self-pay

## 2020-10-19 ENCOUNTER — Ambulatory Visit: Payer: Federal, State, Local not specified - PPO | Admitting: Family Medicine

## 2020-10-19 ENCOUNTER — Encounter: Payer: Self-pay | Admitting: Family Medicine

## 2020-10-19 VITALS — BP 120/78 | HR 77 | Temp 97.3°F | Ht 74.0 in | Wt 250.0 lb

## 2020-10-19 DIAGNOSIS — D649 Anemia, unspecified: Secondary | ICD-10-CM | POA: Diagnosis not present

## 2020-10-19 DIAGNOSIS — E611 Iron deficiency: Secondary | ICD-10-CM | POA: Diagnosis not present

## 2020-10-19 DIAGNOSIS — I10 Essential (primary) hypertension: Secondary | ICD-10-CM

## 2020-10-19 DIAGNOSIS — Z0000 Encounter for general adult medical examination without abnormal findings: Secondary | ICD-10-CM

## 2020-10-19 LAB — LIPID PANEL
Cholesterol: 149 mg/dL (ref 0–200)
HDL: 38.3 mg/dL — ABNORMAL LOW (ref >39.00)
LDL Cholesterol: 92 mg/dL (ref 0–99)
NonHDL: 111.17
Total CHOL/HDL Ratio: 4
Triglycerides: 98 mg/dL (ref 0.0–149.0)
VLDL: 19.6 mg/dL (ref 0.0–40.0)

## 2020-10-19 LAB — URINALYSIS, ROUTINE W REFLEX MICROSCOPIC
Bilirubin Urine: NEGATIVE
Hgb urine dipstick: NEGATIVE
Ketones, ur: NEGATIVE
Leukocytes,Ua: NEGATIVE
Nitrite: NEGATIVE
RBC / HPF: NONE SEEN (ref 0–?)
Specific Gravity, Urine: 1.02 (ref 1.000–1.030)
Total Protein, Urine: NEGATIVE
Urine Glucose: NEGATIVE
Urobilinogen, UA: 0.2 (ref 0.0–1.0)
pH: 7.5 (ref 5.0–8.0)

## 2020-10-19 LAB — COMPREHENSIVE METABOLIC PANEL
ALT: 21 U/L (ref 0–53)
AST: 22 U/L (ref 0–37)
Albumin: 3.9 g/dL (ref 3.5–5.2)
Alkaline Phosphatase: 52 U/L (ref 39–117)
BUN: 14 mg/dL (ref 6–23)
CO2: 29 mEq/L (ref 19–32)
Calcium: 9 mg/dL (ref 8.4–10.5)
Chloride: 104 mEq/L (ref 96–112)
Creatinine, Ser: 1.07 mg/dL (ref 0.40–1.50)
GFR: 74.94 mL/min (ref >60.00)
Glucose, Bld: 94 mg/dL (ref 70–99)
Potassium: 4.2 mEq/L (ref 3.5–5.1)
Sodium: 139 mEq/L (ref 135–145)
Total Bilirubin: 0.4 mg/dL (ref 0.2–1.2)
Total Protein: 6.6 g/dL (ref 6.0–8.3)

## 2020-10-19 LAB — CBC
HCT: 46.1 % (ref 39.0–52.0)
Hemoglobin: 15.1 g/dL (ref 13.0–17.0)
MCHC: 32.8 g/dL (ref 30.0–36.0)
MCV: 79.5 fl (ref 78.0–100.0)
Platelets: 198 10*3/uL (ref 150.0–400.0)
RBC: 5.81 Mil/uL (ref 4.22–5.81)
RDW: 15.2 % (ref 11.5–15.5)
WBC: 6.2 10*3/uL (ref 4.0–10.5)

## 2020-10-19 NOTE — Progress Notes (Signed)
Established Patient Office Visit  Subjective:  Patient ID: Jonathan Edwards, male    DOB: 01/05/1961  Age: 60 y.o. MRN: [id number]  CC:  Chief Complaint  Patient presents with  . Follow-up    Follow up patient fasting for labs, no concerns.     HPI Claude Waldman presents for follow-up of blood pressure, status post radical prostatectomy.  Continues to deal with some incontinence in ED but seems to be recovering.  Continues follow-up with urology.  Has been taking his iron pills daily.  Denies any black tarry stools or blood in his stool or urine.  He is due for colonoscopy.  Last colonoscopy 10 years ago was complaining of polyps.  He believes that he will need cataract surgery.  Blood pressure has been well controlled with the Zestoretic.  He is having no issues taking it.  Past Medical History:  Diagnosis Date  . Cancer [facility])    prostate  . GERD (gastroesophageal reflux disease)   . Hypertension   . Pneumonia   . Tuberculosis    + ppd test  when 60 years old never had any symptoms    Past Surgical History:  Procedure Laterality Date  . EYE SURGERY     hole repair left eye and detached retina surgery  . HERNIA REPAIR     umbilical with mesh  . LAPAROSCOPIC LYSIS OF ADHESIONS N/A 03/06/2020   Procedure: LAPAROSCOPIC LYSIS OF ADHESIONS;  Surgeon: Lucas Mallow, MD;  Location: [facility];  Service: Urology;  Laterality: N/A;  . PELVIC LYMPH NODE DISSECTION Bilateral 03/06/2020   Procedure: BILATERAL PELVIC LYMPH NODE DISSECTION;  Surgeon: Lucas Mallow, MD;  Location: [facility];  Service: Urology;  Laterality: Bilateral;  . ROBOT ASSISTED LAPAROSCOPIC RADICAL PROSTATECTOMY N/A 03/06/2020   Procedure: XI ROBOTIC ASSISTED LAPAROSCOPIC RADICAL PROSTATECTOMY;  Surgeon: Lucas Mallow, MD;  Location: [facility];  Service: Urology;  Laterality: N/A;  . TONSILLECTOMY      No family history on file.  Social History   Socioeconomic History  . Marital status: Married    Spouse  name: Not on file  . Number of children: Not on file  . Years of education: Not on file  . Highest education level: Not on file  Occupational History  . Not on file  Tobacco Use  . Smoking status: Former Smoker    Years: 30.00    Quit date: 08/12/1998    Years since quitting: 22.2  . Smokeless tobacco: Never Used  Vaping Use  . Vaping Use: Never used  Substance and Sexual Activity  . Alcohol use: Yes    Comment: occasional  . Drug use: No  . Sexual activity: Yes    Birth control/protection: None  Other Topics Concern  . Not on file  Social History Narrative  . Not on file   Social Determinants of Health   Financial Resource Strain: Not on file  Food Insecurity: Not on file  Transportation Needs: Not on file  Physical Activity: Not on file  Stress: Not on file  Social Connections: Not on file  Intimate Partner Violence: Not on file    Outpatient Medications Prior to Visit  Medication Sig Dispense Refill  . acetaminophen (TYLENOL) 325 MG tablet Take 650 mg by mouth every 6 (six) hours as needed for moderate pain or headache.    . cyclobenzaprine (FLEXERIL) 10 MG tablet Take 10 mg by mouth 3 (three) times daily as needed for muscle spasms.    Marland Kitchen  Iron, Ferrous Sulfate, 325 (65 Fe) MG TABS Take one twice daily. 180 tablet 1  . lisinopril-hydrochlorothiazide (ZESTORETIC) 20-12.5 MG tablet Take 1 tablet by mouth daily. 90 tablet 0  . magnesium 30 MG tablet Take 30 mg by mouth daily.    . Multiple Vitamin (MULTIVITAMIN WITH MINERALS) TABS tablet Take 1 tablet by mouth daily.    . naproxen sodium (ALEVE) 220 MG tablet Take 440 mg by mouth daily as needed (pain).    . Probiotic CAPS Take 1 capsule by mouth daily.    Marland Kitchen VITAMIN D PO Take 1 capsule by mouth daily.    Marland Kitchen zinc gluconate 50 MG tablet Take 50 mg by mouth daily.    Marland Kitchen HYDROcodone-acetaminophen (NORCO) 5-325 MG tablet Take 1-2 tablets by mouth every 6 (six) hours as needed for moderate pain. (Patient not taking: No sig  reported) 20 tablet 0  . magnesium citrate SOLN Take 0.5 Bottles by mouth See admin instructions. 1/2 bottle today at 11:30am and 1/2 bottle today at 3:30pm (Patient not taking: No sig reported)    . Omega-3 Fatty Acids (OMEGA 3 PO) Take 1 capsule by mouth daily. (Patient not taking: Reported on 10/19/2020)    . prednisoLONE acetate (PRED FORTE) 1 % ophthalmic suspension Place 1 drop into the left eye daily. (Patient not taking: Reported on 10/19/2020)    . tadalafil (CIALIS) 10 MG tablet Take 1 tablet (10 mg total) by mouth daily as needed for erectile dysfunction. (Patient not taking: No sig reported) 30 tablet 3   No facility-administered medications prior to visit.    No Known Allergies  ROS Review of Systems  Constitutional: Negative.   HENT: Negative.   Eyes: Negative for photophobia and visual disturbance.  Respiratory: Negative.   Cardiovascular: Negative.   Gastrointestinal: Negative.  Negative for anal bleeding and blood in stool.  Genitourinary: Negative for difficulty urinating, frequency and hematuria.  Musculoskeletal: Negative.   Allergic/Immunologic: Negative for immunocompromised state.  Neurological: Negative for speech difficulty and light-headedness.  Hematological: Does not bruise/bleed easily.  Psychiatric/Behavioral: Negative.       Objective:    Physical Exam Vitals and nursing note reviewed.  Constitutional:      General: He is not in acute distress.    Appearance: Normal appearance. He is not ill-appearing, toxic-appearing or diaphoretic.  HENT:     Head: Normocephalic and atraumatic.     Right Ear: External ear normal.     Left Ear: External ear normal.     Mouth/Throat:     Mouth: Mucous membranes are moist.     Pharynx: Oropharynx is clear. No oropharyngeal exudate or posterior oropharyngeal erythema.  Eyes:     General: No scleral icterus.       Right eye: No discharge.        Left eye: No discharge.     Extraocular Movements: Extraocular  movements intact.     Conjunctiva/sclera: Conjunctivae normal.     Pupils: Pupils are equal, round, and reactive to light.  Neck:     Vascular: No carotid bruit.  Cardiovascular:     Rate and Rhythm: Normal rate and regular rhythm.  Pulmonary:     Effort: Pulmonary effort is normal.     Breath sounds: Normal breath sounds.  Abdominal:     General: Bowel sounds are normal.  Musculoskeletal:     Cervical back: No rigidity or tenderness.  Lymphadenopathy:     Cervical: No cervical adenopathy.  Skin:    General: Skin is  warm and dry.  Neurological:     Mental Status: He is alert and oriented to person, place, and time.  Psychiatric:        Mood and Affect: Mood normal.        Behavior: Behavior normal.     BP 120/78   Pulse 77   Temp (!) 97.3 F (36.3 C) (Temporal)   Ht 6\' 2"  (1.88 m)   Wt 250 lb (113.4 kg)   SpO2 97%   BMI 32.10 kg/m  Wt Readings from Last 3 Encounters:  10/19/20 250 lb (113.4 kg)  04/12/20 234 lb (106.1 kg)  03/13/20 238 lb 15.7 oz (108.4 kg)     Health Maintenance Due  Topic Date Due  . COLONOSCOPY (Pts 45-57yrs Insurance coverage will need to be confirmed)  07/26/2020    There are no preventive care reminders to display for this patient.  Lab Results  Component Value Date   TSH 1.73 04/12/2020   Lab Results  Component Value Date   WBC 7.1 04/12/2020   HGB 12.6 (L) 04/12/2020   HCT 39.6 04/12/2020   MCV 78.6 04/12/2020   PLT 231.0 04/12/2020   Lab Results  Component Value Date   NA 138 04/12/2020   K 4.2 04/12/2020   CO2 28 04/12/2020   GLUCOSE 88 04/12/2020   BUN 11 04/12/2020   CREATININE 1.13 04/12/2020   BILITOT 1.1 03/12/2020   ALKPHOS 44 03/12/2020   AST 31 03/12/2020   ALT 31 03/12/2020   PROT 6.7 03/12/2020   ALBUMIN 3.7 03/12/2020   CALCIUM 9.3 04/12/2020   ANIONGAP 8 03/14/2020   GFR 79.85 04/12/2020   Lab Results  Component Value Date   CHOL 149 09/20/2019   Lab Results  Component Value Date   HDL 37.50  (L) 09/20/2019   Lab Results  Component Value Date   LDLCALC 82 09/20/2019   Lab Results  Component Value Date   TRIG 145.0 09/20/2019   Lab Results  Component Value Date   CHOLHDL 4 09/20/2019   Lab Results  Component Value Date   HGBA1C 5.0 04/12/2020      Assessment & Plan:   Problem List Items Addressed This Visit      Cardiovascular and Mediastinum   Essential hypertension - Primary   Relevant Orders   CBC   Comprehensive metabolic panel   Urinalysis, Routine w reflex microscopic     Other   Healthcare maintenance   Relevant Orders   Lipid panel   Ambulatory referral to Gastroenterology   Anemia   Relevant Orders   CBC   Ambulatory referral to Gastroenterology   POC Hemoccult Bld/Stl (3-Cd Home Screen)    Other Visit Diagnoses    Iron deficiency       Relevant Orders   CBC   Iron, TIBC and Ferritin Panel   Ambulatory referral to Gastroenterology   POC Hemoccult Bld/Stl (3-Cd Home Screen)      No orders of the defined types were placed in this encounter.   Follow-up: Return in about 6 months (around 04/21/2021), or if symptoms worsen or fail to improve.   Continue all medicines as above.  With his recent history of iron deficiency anemia patient agrees to go for follow-up colonoscopy. Libby Maw, MD

## 2020-10-20 LAB — IRON,TIBC AND FERRITIN PANEL
%SAT: 19 % (calc) — ABNORMAL LOW (ref 20–48)
Ferritin: 34 ng/mL (ref 24–380)
Iron: 63 ug/dL (ref 50–180)
TIBC: 340 mcg/dL (calc) (ref 250–425)

## 2020-10-24 ENCOUNTER — Encounter: Payer: Self-pay | Admitting: Internal Medicine

## 2020-10-31 DIAGNOSIS — N5231 Erectile dysfunction following radical prostatectomy: Secondary | ICD-10-CM | POA: Diagnosis not present

## 2020-11-15 DIAGNOSIS — C61 Malignant neoplasm of prostate: Secondary | ICD-10-CM | POA: Diagnosis not present

## 2020-11-22 DIAGNOSIS — N5231 Erectile dysfunction following radical prostatectomy: Secondary | ICD-10-CM | POA: Diagnosis not present

## 2020-11-22 DIAGNOSIS — C61 Malignant neoplasm of prostate: Secondary | ICD-10-CM | POA: Diagnosis not present

## 2020-11-22 DIAGNOSIS — N393 Stress incontinence (female) (male): Secondary | ICD-10-CM | POA: Diagnosis not present

## 2020-11-23 DIAGNOSIS — I10 Essential (primary) hypertension: Secondary | ICD-10-CM | POA: Diagnosis not present

## 2020-11-23 DIAGNOSIS — H2513 Age-related nuclear cataract, bilateral: Secondary | ICD-10-CM | POA: Diagnosis not present

## 2020-11-23 DIAGNOSIS — H2512 Age-related nuclear cataract, left eye: Secondary | ICD-10-CM | POA: Diagnosis not present

## 2020-11-23 DIAGNOSIS — H25043 Posterior subcapsular polar age-related cataract, bilateral: Secondary | ICD-10-CM | POA: Diagnosis not present

## 2020-11-23 DIAGNOSIS — H35373 Puckering of macula, bilateral: Secondary | ICD-10-CM | POA: Diagnosis not present

## 2020-11-23 DIAGNOSIS — H18413 Arcus senilis, bilateral: Secondary | ICD-10-CM | POA: Diagnosis not present

## 2020-12-06 ENCOUNTER — Ambulatory Visit (AMBULATORY_SURGERY_CENTER): Payer: Self-pay

## 2020-12-06 ENCOUNTER — Other Ambulatory Visit: Payer: Self-pay

## 2020-12-06 VITALS — Ht 74.0 in | Wt 240.0 lb

## 2020-12-06 DIAGNOSIS — Z1211 Encounter for screening for malignant neoplasm of colon: Secondary | ICD-10-CM

## 2020-12-06 MED ORDER — SUTAB 1479-225-188 MG PO TABS
12.0000 | ORAL_TABLET | ORAL | 0 refills | Status: DC
Start: 2020-12-06 — End: 2020-12-22

## 2020-12-06 NOTE — Progress Notes (Signed)
No allergies to soy or egg Pt is not on blood thinners or diet pills Denies issues with sedation/intubation Denies atrial flutter/fib Denies constipation   Emmi instructions given to pt  Pt is aware of Covid safety and care partner requirements.  

## 2020-12-11 ENCOUNTER — Telehealth: Payer: Self-pay | Admitting: Internal Medicine

## 2020-12-11 DIAGNOSIS — Z1211 Encounter for screening for malignant neoplasm of colon: Secondary | ICD-10-CM

## 2020-12-11 MED ORDER — PEG 3350-KCL-NA BICARB-NACL 420 G PO SOLR: 4000.0000 mL | Freq: Once | ORAL | 0 refills | Status: AC

## 2020-12-11 NOTE — Telephone Encounter (Signed)
Patient called states the Nila Nephew is still expensive and would like to switch to a solution prep instead.

## 2020-12-11 NOTE — Telephone Encounter (Signed)
Patient called back and does want to pay for the sutab he states the coupon did work. Patient request the prep his wife did-which is Golytely. I did explained that he must drink the entire amount, he states he will be able to do that. New prep instructions sent to [facility] and mailed to pt and Rx sent to pharmacy-pt is aware.

## 2020-12-11 NOTE — Addendum Note (Signed)
Addended by: Levonne Spiller on: 12/11/2020 05:17 PM   Modules accepted: Orders

## 2020-12-11 NOTE — Telephone Encounter (Signed)
Nila Nephew will be $120 per pt. I encouraged him to call pharmacy and ask them to use the coupon that is attached to sutab RX. He will and he will call us back if needed.

## 2020-12-22 ENCOUNTER — Encounter: Payer: Self-pay | Admitting: Internal Medicine

## 2020-12-22 ENCOUNTER — Other Ambulatory Visit: Payer: Self-pay

## 2020-12-22 ENCOUNTER — Ambulatory Visit (AMBULATORY_SURGERY_CENTER): Payer: Federal, State, Local not specified - PPO | Admitting: Internal Medicine

## 2020-12-22 VITALS — BP 124/87 | HR 70 | Temp 98.6°F | Resp 11 | Ht 74.0 in | Wt 240.0 lb

## 2020-12-22 DIAGNOSIS — Z1211 Encounter for screening for malignant neoplasm of colon: Secondary | ICD-10-CM | POA: Diagnosis not present

## 2020-12-22 MED ORDER — SODIUM CHLORIDE 0.9 % IV SOLN: 500.0000 mL | Freq: Once | INTRAVENOUS | Status: DC

## 2020-12-22 NOTE — Progress Notes (Signed)
Pt's states no medical or surgical changes since previsit or office visit. 

## 2020-12-22 NOTE — Patient Instructions (Signed)
REPEAT SCREENING COLONOSCOPY IN 10 YEARS.   YOU HAD AN ENDOSCOPIC PROCEDURE TODAY AT [facility]:   Refer to the procedure report that was given to you for any specific questions about what was found during the examination.  If the procedure report does not answer your questions, please call your gastroenterologist to clarify.  If you requested that your care partner not be given the details of your procedure findings, then the procedure report has been included in a sealed envelope for you to review at your convenience later.  YOU SHOULD EXPECT: Some feelings of bloating in the abdomen. Passage of more gas than usual.  Walking can help get rid of the air that was put into your GI tract during the procedure and reduce the bloating. If you had a lower endoscopy (such as a colonoscopy or flexible sigmoidoscopy) you may notice spotting of blood in your stool or on the toilet paper. If you underwent a bowel prep for your procedure, you may not have a normal bowel movement for a few days.  Please Note:  You might notice some irritation and congestion in your nose or some drainage.  This is from the oxygen used during your procedure.  There is no need for concern and it should clear up in a day or so.  SYMPTOMS TO REPORT IMMEDIATELY:   Following lower endoscopy (colonoscopy or flexible sigmoidoscopy):  Excessive amounts of blood in the stool  Significant tenderness or worsening of abdominal pains  Swelling of the abdomen that is new, acute  Fever of 100F or higher  For urgent or emergent issues, a gastroenterologist can be reached at any hour by calling [phone]. Do not use MyChart messaging for urgent concerns.    DIET:  We do recommend a small meal at first, but then you may proceed to your regular diet.  Drink plenty of fluids but you should avoid alcoholic beverages for 24 hours.  ACTIVITY:  You should plan to take it easy for the rest of today and you should NOT DRIVE  or use heavy machinery until tomorrow (because of the sedation medicines used during the test).    FOLLOW UP: Our staff will call the number listed on your records 48-72 hours following your procedure to check on you and address any questions or concerns that you may have regarding the information given to you following your procedure. If we do not reach you, we will leave a message.  We will attempt to reach you two times.  During this call, we will ask if you have developed any symptoms of COVID 19. If you develop any symptoms (ie: fever, flu-like symptoms, shortness of breath, cough etc.) before then, please call [phone].  If you test positive for Covid 19 in the 2 weeks post procedure, please call and report this information to [place].    If any biopsies were taken you will be contacted by phone or by letter within the next 1-3 weeks.  Please call us at [phone] if you have not heard about the biopsies in 3 weeks.    SIGNATURES/CONFIDENTIALITY: You and/or your care partner have signed paperwork which will be entered into your electronic medical record.  These signatures attest to the fact that that the information above on your After Visit Summary has been reviewed and is understood.  Full responsibility of the confidentiality of this discharge information lies with you and/or your care-partner.

## 2020-12-22 NOTE — Op Note (Signed)
[facility] Patient Name: Jonathan Edwards Procedure Date: 12/22/2020 9:46 AM MRN: [id number] Endoscopist: Docia Chuck. Henrene Pastor , MD Age: 60 Referring MD:  Date of Birth: 11-Aug-1961 Gender: Male Account #: [id number] Procedure:                Colonoscopy Indications:              Screening for colorectal malignant neoplasm. Index                            examination 2011 was normal Medicines:                Monitored Anesthesia Care Procedure:                Pre-Anesthesia Assessment:                           - Prior to the procedure, a History and Physical                            was performed, and patient medications and                            allergies were reviewed. The patient's tolerance of                            previous anesthesia was also reviewed. The risks                            and benefits of the procedure and the sedation                            options and risks were discussed with the patient.                            All questions were answered, and informed consent                            was obtained. Prior Anticoagulants: The patient has                            taken no previous anticoagulant or antiplatelet                            agents. ASA Grade Assessment: II - A patient with                            mild systemic disease. After reviewing the risks                            and benefits, the patient was deemed in                            satisfactory condition to undergo the procedure.  After obtaining informed consent, the colonoscope                            was passed under direct vision. Throughout the                            procedure, the patient's blood pressure, pulse, and                            oxygen saturations were monitored continuously. The                            Olympus CF-[id number]) Colonoscope was                            introduced through the anus and  advanced to the the                            cecum, identified by appendiceal orifice and                            ileocecal valve. The ileocecal valve, appendiceal                            orifice, and rectum were photographed. The quality                            of the bowel preparation was excellent. The                            colonoscopy was performed without difficulty. The                            patient tolerated the procedure well. The bowel                            preparation used was SUPREP via split dose                            instruction. Scope In: 10:06:13 AM Scope Out: 10:19:22 AM Scope Withdrawal Time: 0 hours 10 minutes 10 seconds  Total Procedure Duration: 0 hours 13 minutes 9 seconds  Findings:                 The entire examined colon appeared normal on direct                            and retroflexion views. Complications:            No immediate complications. Estimated blood loss:                            None. Estimated Blood Loss:     Estimated blood loss: none. Impression:               - The entire examined colon is  normal on direct and                            retroflexion views.                           - No specimens collected. Recommendation:           - Repeat colonoscopy in 10 years for screening                            purposes.                           - Patient has a contact number available for                            emergencies. The signs and symptoms of potential                            delayed complications were discussed with the                            patient. Return to normal activities tomorrow.                            Written discharge instructions were provided to the                            patient.                           - Resume previous diet.                           - Continue present medications. Docia Chuck. Henrene Pastor, MD 12/22/2020 10:23:42 AM This report has been signed electronically.

## 2020-12-22 NOTE — Progress Notes (Signed)
A and O x3. Report to RN. Tolerated MAC anesthesia well.

## 2020-12-22 NOTE — Progress Notes (Signed)
VS taken by C.W. 

## 2020-12-26 ENCOUNTER — Telehealth: Payer: Self-pay | Admitting: *Deleted

## 2020-12-26 NOTE — Telephone Encounter (Signed)
Second follow up call made.

## 2020-12-26 NOTE — Telephone Encounter (Signed)
First attempt, left VM.  

## 2021-01-03 DIAGNOSIS — H2512 Age-related nuclear cataract, left eye: Secondary | ICD-10-CM | POA: Diagnosis not present

## 2021-02-15 DIAGNOSIS — C61 Malignant neoplasm of prostate: Secondary | ICD-10-CM | POA: Diagnosis not present

## 2021-02-21 DIAGNOSIS — H2511 Age-related nuclear cataract, right eye: Secondary | ICD-10-CM | POA: Diagnosis not present

## 2021-02-28 ENCOUNTER — Other Ambulatory Visit: Payer: Self-pay | Admitting: Family Medicine

## 2021-02-28 DIAGNOSIS — I10 Essential (primary) hypertension: Secondary | ICD-10-CM

## 2021-04-23 ENCOUNTER — Other Ambulatory Visit: Payer: Self-pay

## 2021-04-23 ENCOUNTER — Ambulatory Visit: Payer: Federal, State, Local not specified - PPO | Admitting: Family Medicine

## 2021-04-23 ENCOUNTER — Encounter: Payer: Self-pay | Admitting: Family Medicine

## 2021-04-23 VITALS — BP 142/84 | HR 68 | Temp 97.4°F | Ht 74.0 in | Wt 246.2 lb

## 2021-04-23 DIAGNOSIS — E611 Iron deficiency: Secondary | ICD-10-CM | POA: Insufficient documentation

## 2021-04-23 DIAGNOSIS — N5201 Erectile dysfunction due to arterial insufficiency: Secondary | ICD-10-CM | POA: Diagnosis not present

## 2021-04-23 DIAGNOSIS — C61 Malignant neoplasm of prostate: Secondary | ICD-10-CM | POA: Diagnosis not present

## 2021-04-23 DIAGNOSIS — I10 Essential (primary) hypertension: Secondary | ICD-10-CM

## 2021-04-23 DIAGNOSIS — Z23 Encounter for immunization: Secondary | ICD-10-CM | POA: Diagnosis not present

## 2021-04-23 DIAGNOSIS — Z0000 Encounter for general adult medical examination without abnormal findings: Secondary | ICD-10-CM

## 2021-04-23 DIAGNOSIS — F341 Dysthymic disorder: Secondary | ICD-10-CM

## 2021-04-23 LAB — CBC
HCT: 46.5 % (ref 39.0–52.0)
Hemoglobin: 15.2 g/dL (ref 13.0–17.0)
MCHC: 32.7 g/dL (ref 30.0–36.0)
MCV: 81.6 fl (ref 78.0–100.0)
Platelets: 188 10*3/uL (ref 150.0–400.0)
RBC: 5.69 Mil/uL (ref 4.22–5.81)
RDW: 14.6 % (ref 11.5–15.5)
WBC: 7.5 10*3/uL (ref 4.0–10.5)

## 2021-04-23 LAB — IRON,TIBC AND FERRITIN PANEL
%SAT: 16 % (calc) — ABNORMAL LOW (ref 20–48)
Ferritin: 58 ng/mL (ref 24–380)
Iron: 53 ug/dL (ref 50–180)
TIBC: 329 mcg/dL (calc) (ref 250–425)

## 2021-04-23 LAB — URINALYSIS, ROUTINE W REFLEX MICROSCOPIC
Bilirubin Urine: NEGATIVE
Hgb urine dipstick: NEGATIVE
Ketones, ur: NEGATIVE
Leukocytes,Ua: NEGATIVE
Nitrite: NEGATIVE
RBC / HPF: NONE SEEN (ref 0–?)
Specific Gravity, Urine: >=1.03 — AB (ref 1.000–1.030)
Total Protein, Urine: NEGATIVE
Urine Glucose: NEGATIVE
Urobilinogen, UA: 0.2 (ref 0.0–1.0)
pH: 6 (ref 5.0–8.0)

## 2021-04-23 LAB — BASIC METABOLIC PANEL
BUN: 13 mg/dL (ref 6–23)
CO2: 25 mEq/L (ref 19–32)
Calcium: 9 mg/dL (ref 8.4–10.5)
Chloride: 107 mEq/L (ref 96–112)
Creatinine, Ser: 1.03 mg/dL (ref 0.40–1.50)
GFR: 78.16 mL/min (ref >60.00)
Glucose, Bld: 92 mg/dL (ref 70–99)
Potassium: 4.3 mEq/L (ref 3.5–5.1)
Sodium: 141 mEq/L (ref 135–145)

## 2021-04-23 LAB — PSA: PSA: 0 ng/mL — ABNORMAL LOW (ref 0.10–4.00)

## 2021-04-23 MED ORDER — BUPROPION HCL ER (XL) 150 MG PO TB24: 150.0000 mg | ORAL_TABLET | Freq: Every day | ORAL | 2 refills | Status: DC

## 2021-04-23 NOTE — Progress Notes (Signed)
Established Patient Office Visit  Subjective:  Patient ID: Jonathan Edwards, male    DOB: 01-13-1961  Age: 60 y.o. MRN: [id number]  CC:  Chief Complaint  Patient presents with   Follow-up    6 month follow up, no concerns. Patient fasting.     HPI [name] presents for follow-up of hypertension, iron deficiency, ED status post prostatectomy.  ED continues to be an issue.  Direct injections are not that helpful.  It has been a year.  Wife is 56 years younger but remains supportive.  He has a new grandfather today.  Totally normal colonoscopy.  Status post bilateral cataract extraction and seeing quite well.  He is grieving the loss of his sexual performance.  Past Medical History:  Diagnosis Date   Cancer [facility])    prostate  2021   Cataract    GERD (gastroesophageal reflux disease)    Glaucoma    Hypertension    Pneumonia    Tuberculosis    + ppd test  when 60 years old never had any symptoms    Past Surgical History:  Procedure Laterality Date   COLONOSCOPY  2011   EYE SURGERY     hole repair left eye and detached retina surgery   HERNIA REPAIR     umbilical with mesh   LAPAROSCOPIC LYSIS OF ADHESIONS N/A 03/06/2020   Procedure: LAPAROSCOPIC LYSIS OF ADHESIONS;  Surgeon: Lucas Mallow, MD;  Location: [facility];  Service: Urology;  Laterality: N/A;   PELVIC LYMPH NODE DISSECTION Bilateral 03/06/2020   Procedure: BILATERAL PELVIC LYMPH NODE DISSECTION;  Surgeon: Lucas Mallow, MD;  Location: [facility];  Service: Urology;  Laterality: Bilateral;   ROBOT ASSISTED LAPAROSCOPIC RADICAL PROSTATECTOMY N/A 03/06/2020   Procedure: XI ROBOTIC ASSISTED LAPAROSCOPIC RADICAL PROSTATECTOMY;  Surgeon: Lucas Mallow, MD;  Location: [facility];  Service: Urology;  Laterality: N/A;   TONSILLECTOMY      Family History  Problem Relation Age of Onset   Colon cancer Neg Hx    Colon polyps Neg Hx    Esophageal cancer Neg Hx    Rectal cancer Neg Hx    Stomach cancer Neg Hx      Social History   Socioeconomic History   Marital status: Married    Spouse name: Not on file   Number of children: Not on file   Years of education: Not on file   Highest education level: Not on file  Occupational History   Not on file  Tobacco Use   Smoking status: Former    Years: 30.00    Types: Cigarettes    Quit date: 08/12/1998    Years since quitting: 22.7   Smokeless tobacco: Never  Vaping Use   Vaping Use: Never used  Substance and Sexual Activity   Alcohol use: Yes    Comment: occasional   Drug use: No   Sexual activity: Yes    Birth control/protection: None  Other Topics Concern   Not on file  Social History Narrative   Not on file   Social Determinants of Health   Financial Resource Strain: Not on file  Food Insecurity: Not on file  Transportation Needs: Not on file  Physical Activity: Not on file  Stress: Not on file  Social Connections: Not on file  Intimate Partner Violence: Not on file    Outpatient Medications Prior to Visit  Medication Sig Dispense Refill   acetaminophen (TYLENOL) 325 MG tablet Take 650 mg by mouth  every 6 (six) hours as needed for moderate pain or headache.     cyclobenzaprine (FLEXERIL) 10 MG tablet Take 10 mg by mouth 3 (three) times daily as needed for muscle spasms.     Flaxseed, Linseed, (FLAX SEEDS PO) Take by mouth.     Iron, Ferrous Sulfate, 325 (65 Fe) MG TABS Take one twice daily. 180 tablet 1   lisinopril-hydrochlorothiazide (ZESTORETIC) 20-12.5 MG tablet TAKE 1 TABLET BY MOUTH EVERY DAY 90 tablet 0   magnesium 30 MG tablet Take 30 mg by mouth daily.     Multiple Vitamin (MULTIVITAMIN WITH MINERALS) TABS tablet Take 1 tablet by mouth daily.     Omega-3 Fatty Acids (OMEGA 3 PO) Take 1 capsule by mouth daily.     Probiotic CAPS Take 1 capsule by mouth daily.     VITAMIN D PO Take 1 capsule by mouth daily.     VITAMIN E PO Take by mouth.     zinc gluconate 50 MG tablet Take 50 mg by mouth daily.     BESIVANCE 0.6 %  SUSP Place 1 drop into the left eye 3 (three) times daily. (Patient not taking: No sig reported)     DUREZOL 0.05 % EMUL Place 1 drop into the left eye 3 (three) times daily. (Patient not taking: No sig reported)     HYDROcodone-acetaminophen (NORCO) 5-325 MG tablet Take 1-2 tablets by mouth every 6 (six) hours as needed for moderate pain. (Patient not taking: No sig reported) 20 tablet 0   magnesium citrate SOLN Take 0.5 Bottles by mouth See admin instructions. 1/2 bottle today at 11:30am and 1/2 bottle today at 3:30pm (Patient not taking: No sig reported)     naproxen sodium (ALEVE) 220 MG tablet Take 440 mg by mouth daily as needed (pain). (Patient not taking: No sig reported)     polyethylene glycol-electrolytes (NULYTELY) 420 g solution Take 4,000 mLs by mouth once.     prednisoLONE acetate (PRED FORTE) 1 % ophthalmic suspension Place 1 drop into the left eye daily. (Patient not taking: No sig reported)     tadalafil (CIALIS) 10 MG tablet Take 1 tablet (10 mg total) by mouth daily as needed for erectile dysfunction. (Patient not taking: No sig reported) 30 tablet 3   No facility-administered medications prior to visit.    No Known Allergies  ROS Review of Systems  Constitutional: Negative.   HENT: Negative.    Eyes:  Negative for photophobia and visual disturbance.  Respiratory: Negative.    Cardiovascular: Negative.   Gastrointestinal: Negative.   Genitourinary: Negative.   Neurological:  Negative for speech difficulty and weakness.  Psychiatric/Behavioral:  Positive for dysphoric mood. Negative for self-injury and suicidal ideas. The patient is not nervous/anxious.      Objective:    Physical Exam Vitals and nursing note reviewed.  Constitutional:      General: He is not in acute distress.    Appearance: Normal appearance. He is not ill-appearing, toxic-appearing or diaphoretic.  HENT:     Head: Normocephalic and atraumatic.     Right Ear: External ear normal.     Left  Ear: External ear normal.     Mouth/Throat:     Mouth: Mucous membranes are moist.     Pharynx: Oropharynx is clear. No oropharyngeal exudate or posterior oropharyngeal erythema.  Eyes:     Extraocular Movements: Extraocular movements intact.     Conjunctiva/sclera: Conjunctivae normal.     Pupils: Pupils are equal, round, and reactive  to light.  Neck:     Vascular: No carotid bruit.  Cardiovascular:     Rate and Rhythm: Normal rate and regular rhythm.  Pulmonary:     Effort: Pulmonary effort is normal.     Breath sounds: Normal breath sounds.  Musculoskeletal:     Cervical back: No rigidity or tenderness.  Lymphadenopathy:     Cervical: No cervical adenopathy.  Skin:    General: Skin is dry.  Neurological:     Mental Status: He is alert and oriented to person, place, and time.  Psychiatric:        Mood and Affect: Mood normal.        Behavior: Behavior normal.    BP (!) 142/84 (BP Location: Left Arm, Patient Position: Sitting, Cuff Size: Large)   Pulse 68   Temp (!) 97.4 F (36.3 C) (Temporal)   Ht '6\' 2"'$  (1.88 m)   Wt 246 lb 3.2 oz (111.7 kg)   SpO2 96%   BMI 31.61 kg/m  Wt Readings from Last 3 Encounters:  04/23/21 246 lb 3.2 oz (111.7 kg)  12/22/20 240 lb (108.9 kg)  12/06/20 240 lb (108.9 kg)     Health Maintenance Due  Topic Date Due   Pneumococcal Vaccine 40-70 Years old (1 - PCV) Never done   Zoster Vaccines- Shingrix (2 of 2) 11/14/2020    There are no preventive care reminders to display for this patient.  Lab Results  Component Value Date   TSH 1.73 04/12/2020   Lab Results  Component Value Date   WBC 6.2 10/19/2020   HGB 15.1 10/19/2020   HCT 46.1 10/19/2020   MCV 79.5 10/19/2020   PLT 198.0 10/19/2020   Lab Results  Component Value Date   NA 139 10/19/2020   K 4.2 10/19/2020   CO2 29 10/19/2020   GLUCOSE 94 10/19/2020   BUN 14 10/19/2020   CREATININE 1.07 10/19/2020   BILITOT 0.4 10/19/2020   ALKPHOS 52 10/19/2020   AST 22  10/19/2020   ALT 21 10/19/2020   PROT 6.6 10/19/2020   ALBUMIN 3.9 10/19/2020   CALCIUM 9.0 10/19/2020   ANIONGAP 8 03/14/2020   GFR 74.94 10/19/2020   Lab Results  Component Value Date   CHOL 149 10/19/2020   Lab Results  Component Value Date   HDL 38.30 (L) 10/19/2020   Lab Results  Component Value Date   LDLCALC 92 10/19/2020   Lab Results  Component Value Date   TRIG 98.0 10/19/2020   Lab Results  Component Value Date   CHOLHDL 4 10/19/2020   Lab Results  Component Value Date   HGBA1C 5.0 04/12/2020      Assessment & Plan:   Problem List Items Addressed This Visit       Cardiovascular and Mediastinum   Essential hypertension - Primary   Relevant Orders   Basic metabolic panel   Erectile dysfunction due to arterial insufficiency     Genitourinary   Prostate cancer ([place])   Relevant Orders   PSA     Other   Healthcare maintenance   Relevant Orders   Flu Vaccine QUAD 6+ mos PF IM (Fluarix Quad PF) (Completed)   Dysthymia   Relevant Medications   buPROPion (WELLBUTRIN XL) 150 MG 24 hr tablet   Iron deficiency   Relevant Orders   CBC   Iron, TIBC and Ferritin Panel   Urinalysis, Routine w reflex microscopic    Meds ordered this encounter  Medications   buPROPion [facility]  XL) 150 MG 24 hr tablet    Sig: Take 1 tablet (150 mg total) by mouth daily.    Dispense:  30 tablet    Refill:  2    Follow-up: Return in about 2 months (around 06/23/2021), or if symptoms worsen or fail to improve.   Agrees to give Wellbutrin a try to elevate mood.  Information was given follow-up in 8 weeks.  Please let me know if there are any issues taking it. Libby Maw, MD

## 2021-04-26 ENCOUNTER — Telehealth: Payer: Self-pay

## 2021-04-26 NOTE — Telephone Encounter (Signed)
Patient calling states that he was currently at work and not able to check on the strength of the iron pill that he has at home. He believe it's '50mg'$  patient not sure what dosage he should be on while taking twice a day would like to know if a prescription could be sent in? Per patient his prescription will be running out soon. Please advise

## 2021-04-27 NOTE — Telephone Encounter (Signed)
Patient aware of message below.

## 2021-05-15 ENCOUNTER — Other Ambulatory Visit: Payer: Self-pay | Admitting: Family Medicine

## 2021-05-15 DIAGNOSIS — F341 Dysthymic disorder: Secondary | ICD-10-CM

## 2021-06-02 ENCOUNTER — Other Ambulatory Visit: Payer: Self-pay | Admitting: Family Medicine

## 2021-06-02 DIAGNOSIS — I10 Essential (primary) hypertension: Secondary | ICD-10-CM

## 2021-06-25 ENCOUNTER — Other Ambulatory Visit: Payer: Self-pay

## 2021-06-26 ENCOUNTER — Encounter: Payer: Self-pay | Admitting: Family Medicine

## 2021-06-26 ENCOUNTER — Ambulatory Visit: Payer: Federal, State, Local not specified - PPO | Admitting: Family Medicine

## 2021-06-26 VITALS — BP 142/88 | HR 77 | Temp 97.6°F | Ht 74.0 in | Wt 249.4 lb

## 2021-06-26 DIAGNOSIS — F341 Dysthymic disorder: Secondary | ICD-10-CM | POA: Diagnosis not present

## 2021-06-26 DIAGNOSIS — Z23 Encounter for immunization: Secondary | ICD-10-CM | POA: Diagnosis not present

## 2021-06-26 DIAGNOSIS — E611 Iron deficiency: Secondary | ICD-10-CM

## 2021-06-26 DIAGNOSIS — I10 Essential (primary) hypertension: Secondary | ICD-10-CM | POA: Diagnosis not present

## 2021-06-26 DIAGNOSIS — N5201 Erectile dysfunction due to arterial insufficiency: Secondary | ICD-10-CM

## 2021-06-26 LAB — CBC
HCT: 47.7 % (ref 39.0–52.0)
Hemoglobin: 15.5 g/dL (ref 13.0–17.0)
MCHC: 32.6 g/dL (ref 30.0–36.0)
MCV: 82.2 fl (ref 78.0–100.0)
Platelets: 209 10*3/uL (ref 150.0–400.0)
RBC: 5.8 Mil/uL (ref 4.22–5.81)
RDW: 14.8 % (ref 11.5–15.5)
WBC: 8.9 10*3/uL (ref 4.0–10.5)

## 2021-06-26 MED ORDER — LISINOPRIL 20 MG PO TABS: 20.0000 mg | ORAL_TABLET | Freq: Every day | ORAL | 3 refills | Status: DC

## 2021-06-26 MED ORDER — AMLODIPINE BESYLATE 5 MG PO TABS: 5.0000 mg | ORAL_TABLET | Freq: Every day | ORAL | 1 refills | Status: DC

## 2021-06-26 MED ORDER — BUPROPION HCL ER (XL) 150 MG PO TB24: 150.0000 mg | ORAL_TABLET | Freq: Every day | ORAL | 1 refills | Status: DC

## 2021-06-26 NOTE — Progress Notes (Signed)
Established Patient Office Visit  Subjective:  Patient ID: Jonathan Edwards, male    DOB: [birth year]  Age: 60 y.o. MRN: [id number]  CC:  Chief Complaint  Patient presents with   Follow-up    2 month follow up, no concerns.     HPI Jonathan Edwards presents for follow-up of dysthymia, hypertension, ED, iron deficiency and need for pneumococcal vaccine.  Blood pressure remains in the 140s over 80s to 90s on his current therapy.  ED persist status post prostatectomy.  Continues with iron therapy.  Starting dose of Wellbutrin seems to have made a difference for him.  His wife is noted a change.  One of his older daughters is to be married on the 17th of next month.  He has 62 year old triplets who still live at home, 2 boys and a girl.  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  Gets 5 miles and steps daily.  Past Medical History:  Diagnosis Date   Cancer [facility])    prostate  2021   Cataract    GERD (gastroesophageal reflux disease)    Glaucoma    Hypertension    Pneumonia    Tuberculosis    + ppd test  when 60 years old never had any symptoms    Past Surgical History:  Procedure Laterality Date   COLONOSCOPY  2011   EYE SURGERY     hole repair left eye and detached retina surgery   HERNIA REPAIR     umbilical with mesh   LAPAROSCOPIC LYSIS OF ADHESIONS N/A 03/06/2020   Procedure: LAPAROSCOPIC LYSIS OF ADHESIONS;  Surgeon: Lucas Mallow, MD;  Location: [facility];  Service: Urology;  Laterality: N/A;   PELVIC LYMPH NODE DISSECTION Bilateral 03/06/2020   Procedure: BILATERAL PELVIC LYMPH NODE DISSECTION;  Surgeon: Lucas Mallow, MD;  Location: [facility];  Service: Urology;  Laterality: Bilateral;   ROBOT ASSISTED LAPAROSCOPIC RADICAL PROSTATECTOMY N/A 03/06/2020   Procedure: XI ROBOTIC ASSISTED LAPAROSCOPIC RADICAL PROSTATECTOMY;  Surgeon: Lucas Mallow, MD;  Location: [facility];  Service: Urology;  Laterality: N/A;   TONSILLECTOMY      Family History  Problem  Relation Age of Onset   Colon cancer Neg Hx    Colon polyps Neg Hx    Esophageal cancer Neg Hx    Rectal cancer Neg Hx    Stomach cancer Neg Hx     Social History   Socioeconomic History   Marital status: Married    Spouse name: Not on file   Number of children: Not on file   Years of education: Not on file   Highest education level: Not on file  Occupational History   Not on file  Tobacco Use   Smoking status: Former    Years: 30.00    Types: Cigarettes    Quit date: 08/12/1998    Years since quitting: 22.8   Smokeless tobacco: Never  Vaping Use   Vaping Use: Never used  Substance and Sexual Activity   Alcohol use: Yes    Comment: occasional   Drug use: No   Sexual activity: Yes    Birth control/protection: None  Other Topics Concern   Not on file  Social History Narrative   Not on file   Social Determinants of Health   Financial Resource Strain: Not on file  Food Insecurity: Not on file  Transportation Needs: Not on file  Physical Activity: Not on file  Stress: Not on file  Social  Connections: Not on file  Intimate Partner Violence: Not on file    Outpatient Medications Prior to Visit  Medication Sig Dispense Refill   acetaminophen (TYLENOL) 325 MG tablet Take 650 mg by mouth every 6 (six) hours as needed for moderate pain or headache.     cyclobenzaprine (FLEXERIL) 10 MG tablet Take 10 mg by mouth 3 (three) times daily as needed for muscle spasms.     Flaxseed, Linseed, (FLAX SEEDS PO) Take by mouth.     Iron, Ferrous Sulfate, 325 (65 Fe) MG TABS Take one twice daily. 180 tablet 1   magnesium 30 MG tablet Take 30 mg by mouth daily.     Multiple Vitamin (MULTIVITAMIN WITH MINERALS) TABS tablet Take 1 tablet by mouth daily.     Omega-3 Fatty Acids (OMEGA 3 PO) Take 1 capsule by mouth daily.     Probiotic CAPS Take 1 capsule by mouth daily.     VITAMIN D PO Take 1 capsule by mouth daily.     VITAMIN E PO Take by mouth.     zinc gluconate 50 MG tablet Take 50  mg by mouth daily.     buPROPion (WELLBUTRIN XL) 150 MG 24 hr tablet TAKE 1 TABLET BY MOUTH EVERY DAY 90 tablet 1   lisinopril-hydrochlorothiazide (ZESTORETIC) 20-12.5 MG tablet TAKE 1 TABLET BY MOUTH EVERY DAY 90 tablet 0   No facility-administered medications prior to visit.    No Known Allergies  ROS Review of Systems  Constitutional: Negative.   Respiratory: Negative.    Cardiovascular: Negative.   Gastrointestinal: Negative.   Genitourinary: Negative.   Musculoskeletal:  Negative for gait problem and joint swelling.  Skin:  Negative for pallor and rash.  Neurological:  Negative for speech difficulty and weakness.  Psychiatric/Behavioral: Negative.       Objective:    Physical Exam Vitals and nursing note reviewed.  Constitutional:      General: He is not in acute distress.    Appearance: Normal appearance. He is not ill-appearing or toxic-appearing.  HENT:     Head: Normocephalic and atraumatic.     Right Ear: External ear normal.     Left Ear: External ear normal.  Eyes:     General: No scleral icterus.       Right eye: No discharge.        Left eye: No discharge.     Extraocular Movements: Extraocular movements intact.     Conjunctiva/sclera: Conjunctivae normal.  Cardiovascular:     Rate and Rhythm: Normal rate and regular rhythm.  Pulmonary:     Effort: Pulmonary effort is normal.     Breath sounds: Normal breath sounds.  Abdominal:     General: Bowel sounds are normal.  Musculoskeletal:     Cervical back: No rigidity or tenderness.  Lymphadenopathy:     Cervical: No cervical adenopathy.  Neurological:     Mental Status: He is alert and oriented to person, place, and time.  Psychiatric:        Mood and Affect: Mood normal.        Behavior: Behavior normal.    BP (!) 142/88 (BP Location: Left Arm, Patient Position: Sitting, Cuff Size: Large)   Pulse 77   Temp 97.6 F (36.4 C) (Temporal)   Ht 6\' 2"  (1.88 m)   Wt 249 lb 6.4 oz (113.1 kg)   SpO2 97%    BMI 32.02 kg/m  Wt Readings from Last 3 Encounters:  06/26/21 249 lb 6.4 oz (  113.1 kg)  04/23/21 246 lb 3.2 oz (111.7 kg)  12/22/20 240 lb (108.9 kg)     Health Maintenance Due  Topic Date Due   Pneumococcal Vaccine 51-39 Years old (1 - PCV) Never done    There are no preventive care reminders to display for this patient.  Lab Results  Component Value Date   TSH 1.73 04/12/2020   Lab Results  Component Value Date   WBC 7.5 04/23/2021   HGB 15.2 04/23/2021   HCT 46.5 04/23/2021   MCV 81.6 04/23/2021   PLT 188.0 04/23/2021   Lab Results  Component Value Date   NA 141 04/23/2021   K 4.3 04/23/2021   CO2 25 04/23/2021   GLUCOSE 92 04/23/2021   BUN 13 04/23/2021   CREATININE 1.03 04/23/2021   BILITOT 0.4 10/19/2020   ALKPHOS 52 10/19/2020   AST 22 10/19/2020   ALT 21 10/19/2020   PROT 6.6 10/19/2020   ALBUMIN 3.9 10/19/2020   CALCIUM 9.0 04/23/2021   ANIONGAP 8 03/14/2020   GFR 78.16 04/23/2021   Lab Results  Component Value Date   CHOL 149 10/19/2020   Lab Results  Component Value Date   HDL 38.30 (L) 10/19/2020   Lab Results  Component Value Date   LDLCALC 92 10/19/2020   Lab Results  Component Value Date   TRIG 98.0 10/19/2020   Lab Results  Component Value Date   CHOLHDL 4 10/19/2020   Lab Results  Component Value Date   HGBA1C 5.0 04/12/2020      Assessment & Plan:   Problem List Items Addressed This Visit       Cardiovascular and Mediastinum   Essential hypertension - Primary   Relevant Medications   lisinopril (ZESTRIL) 20 MG tablet   amLODipine (NORVASC) 5 MG tablet   Erectile dysfunction due to arterial insufficiency   Relevant Medications   lisinopril (ZESTRIL) 20 MG tablet   amLODipine (NORVASC) 5 MG tablet     Other   Dysthymia   Relevant Medications   buPROPion (WELLBUTRIN XL) 150 MG 24 hr tablet   Iron deficiency   Relevant Orders   Iron, TIBC and Ferritin Panel   CBC   Other Visit Diagnoses     Need for  pneumococcal vaccination       Relevant Orders   Pneumococcal conjugate vaccine 20-valent (Prevnar 20)       Meds ordered this encounter  Medications   buPROPion (WELLBUTRIN XL) 150 MG 24 hr tablet    Sig: Take 1 tablet (150 mg total) by mouth daily.    Dispense:  90 tablet    Refill:  1   lisinopril (ZESTRIL) 20 MG tablet    Sig: Take 1 tablet (20 mg total) by mouth daily.    Dispense:  90 tablet    Refill:  3   amLODipine (NORVASC) 5 MG tablet    Sig: Take 1 tablet (5 mg total) by mouth daily.    Dispense:  90 tablet    Refill:  1    Follow-up: Return in about 3 months (around 09/26/2021).   Have discontinued Zestoretic to see if stopping HCTZ might help ED.  Continue Zestril 20 mg and add 5 mg of amlodipine.  Continue Wellbutrin XL at 150 mg.  Continue iron therapy.  He will check and record his blood pressures occasionally.  We are looking for less than 140/90. Libby Maw, MD

## 2021-06-27 LAB — IRON,TIBC AND FERRITIN PANEL
%SAT: 21 % (calc) (ref 20–48)
Ferritin: 63 ng/mL (ref 24–380)
Iron: 75 ug/dL (ref 50–180)
TIBC: 351 mcg/dL (calc) (ref 250–425)

## 2021-07-13 DIAGNOSIS — N5231 Erectile dysfunction following radical prostatectomy: Secondary | ICD-10-CM | POA: Diagnosis not present

## 2021-07-13 DIAGNOSIS — C61 Malignant neoplasm of prostate: Secondary | ICD-10-CM | POA: Diagnosis not present

## 2021-09-03 ENCOUNTER — Other Ambulatory Visit: Payer: Self-pay | Admitting: Family Medicine

## 2021-09-03 DIAGNOSIS — I10 Essential (primary) hypertension: Secondary | ICD-10-CM

## 2021-09-26 ENCOUNTER — Ambulatory Visit: Payer: Federal, State, Local not specified - PPO | Admitting: Family Medicine

## 2021-09-26 ENCOUNTER — Encounter: Payer: Self-pay | Admitting: Family Medicine

## 2021-09-26 ENCOUNTER — Other Ambulatory Visit: Payer: Self-pay

## 2021-09-26 VITALS — BP 122/78 | HR 82 | Temp 96.9°F | Ht 74.0 in | Wt 248.4 lb

## 2021-09-26 DIAGNOSIS — E611 Iron deficiency: Secondary | ICD-10-CM

## 2021-09-26 DIAGNOSIS — K439 Ventral hernia without obstruction or gangrene: Secondary | ICD-10-CM | POA: Diagnosis not present

## 2021-09-26 DIAGNOSIS — I10 Essential (primary) hypertension: Secondary | ICD-10-CM | POA: Diagnosis not present

## 2021-09-26 DIAGNOSIS — D649 Anemia, unspecified: Secondary | ICD-10-CM | POA: Diagnosis not present

## 2021-09-26 LAB — CBC
HCT: 47.2 % (ref 39.0–52.0)
Hemoglobin: 15.4 g/dL (ref 13.0–17.0)
MCHC: 32.7 g/dL (ref 30.0–36.0)
MCV: 81.4 fl (ref 78.0–100.0)
Platelets: 207 10*3/uL (ref 150.0–400.0)
RBC: 5.79 Mil/uL (ref 4.22–5.81)
RDW: 14.6 % (ref 11.5–15.5)
WBC: 7.8 10*3/uL (ref 4.0–10.5)

## 2021-09-26 NOTE — Progress Notes (Signed)
Established Patient Office Visit  Subjective:  Patient ID: Jonathan Edwards, male    DOB: [birth year]  Age: 61 y.o. MRN: [id number]  CC:  Chief Complaint  Patient presents with   Follow-up    3 month follow up, states that hernia will hurt from time to time. Patient fasting.     HPI Jonathan Edwards presents for follow-up of hypertension, iron deficiency, anemia and concern about a hernia.  Distant history of umbilical hernia repaired with mesh.  He is status post laparoscopic prostatectomy 2 years ago.  PSA has been 0.  He continues follow-up with urology.  Blood pressure has been doing great status post change from Zestoretic to Zestril and amlodipine.  Tolerating both medicines well.  Not sure of whether or not it is helped ED.  Continues with injections as needed.  Continues with iron therapy.  Tolerating well.  Past Medical History:  Diagnosis Date   Cancer [facility])    prostate  2021   Cataract    GERD (gastroesophageal reflux disease)    Glaucoma    Hypertension    Pneumonia    Tuberculosis    + ppd test  when 61 years old never had any symptoms    Past Surgical History:  Procedure Laterality Date   COLONOSCOPY  2011   EYE SURGERY     hole repair left eye and detached retina surgery   HERNIA REPAIR     umbilical with mesh   LAPAROSCOPIC LYSIS OF ADHESIONS N/A 03/06/2020   Procedure: LAPAROSCOPIC LYSIS OF ADHESIONS;  Surgeon: Lucas Mallow, MD;  Location: [facility];  Service: Urology;  Laterality: N/A;   PELVIC LYMPH NODE DISSECTION Bilateral 03/06/2020   Procedure: BILATERAL PELVIC LYMPH NODE DISSECTION;  Surgeon: Lucas Mallow, MD;  Location: [facility];  Service: Urology;  Laterality: Bilateral;   ROBOT ASSISTED LAPAROSCOPIC RADICAL PROSTATECTOMY N/A 03/06/2020   Procedure: XI ROBOTIC ASSISTED LAPAROSCOPIC RADICAL PROSTATECTOMY;  Surgeon: Lucas Mallow, MD;  Location: [facility];  Service: Urology;  Laterality: N/A;   TONSILLECTOMY      Family History  Problem Relation  Age of Onset   Colon cancer Neg Hx    Colon polyps Neg Hx    Esophageal cancer Neg Hx    Rectal cancer Neg Hx    Stomach cancer Neg Hx     Social History   Socioeconomic History   Marital status: Married    Spouse name: Not on file   Number of children: Not on file   Years of education: Not on file   Highest education level: Not on file  Occupational History   Not on file  Tobacco Use   Smoking status: Former    Years: 30.00    Types: Cigarettes    Quit date: 08/12/1998    Years since quitting: 23.1   Smokeless tobacco: Never  Vaping Use   Vaping Use: Never used  Substance and Sexual Activity   Alcohol use: Yes    Comment: occasional   Drug use: No   Sexual activity: Yes    Birth control/protection: None  Other Topics Concern   Not on file  Social History Narrative   Not on file   Social Determinants of Health   Financial Resource Strain: Not on file  Food Insecurity: Not on file  Transportation Needs: Not on file  Physical Activity: Not on file  Stress: Not on file  Social Connections: Not on file  Intimate Partner Violence: Not on file  Outpatient Medications Prior to Visit  Medication Sig Dispense Refill   acetaminophen (TYLENOL) 325 MG tablet Take 650 mg by mouth every 6 (six) hours as needed for moderate pain or headache.     amLODipine (NORVASC) 5 MG tablet Take 1 tablet (5 mg total) by mouth daily. 90 tablet 1   buPROPion (WELLBUTRIN XL) 150 MG 24 hr tablet Take 1 tablet (150 mg total) by mouth daily. 90 tablet 1   cyclobenzaprine (FLEXERIL) 10 MG tablet Take 10 mg by mouth 3 (three) times daily as needed for muscle spasms.     Flaxseed, Linseed, (FLAX SEEDS PO) Take by mouth.     Iron, Ferrous Sulfate, 325 (65 Fe) MG TABS Take one twice daily. 180 tablet 1   lisinopril (ZESTRIL) 20 MG tablet Take 1 tablet (20 mg total) by mouth daily. 90 tablet 3   magnesium 30 MG tablet Take 30 mg by mouth daily.     Omega-3 Fatty Acids (OMEGA 3 PO) Take 1 capsule  by mouth daily.     Probiotic CAPS Take 1 capsule by mouth daily.     VITAMIN D PO Take 1 capsule by mouth daily.     VITAMIN E PO Take by mouth.     zinc gluconate 50 MG tablet Take 50 mg by mouth daily.     Multiple Vitamin (MULTIVITAMIN WITH MINERALS) TABS tablet Take 1 tablet by mouth daily. (Patient not taking: Reported on 09/26/2021)     No facility-administered medications prior to visit.    No Known Allergies  ROS Review of Systems  Constitutional:  Negative for chills, diaphoresis, fatigue, fever and unexpected weight change.  HENT: Negative.    Eyes:  Negative for photophobia and visual disturbance.  Respiratory: Negative.    Cardiovascular: Negative.   Gastrointestinal:  Positive for abdominal pain. Negative for anal bleeding, blood in stool, constipation, diarrhea, nausea and vomiting.  Genitourinary: Negative.   Musculoskeletal:  Negative for gait problem and joint swelling.  Neurological:  Negative for speech difficulty and weakness.  Psychiatric/Behavioral: Negative.       Objective:    Physical Exam Vitals and nursing note reviewed.  Constitutional:      General: He is not in acute distress.    Appearance: Normal appearance. He is not ill-appearing, toxic-appearing or diaphoretic.  HENT:     Head: Normocephalic and atraumatic.     Right Ear: External ear normal.     Left Ear: External ear normal.  Eyes:     General: No scleral icterus.       Right eye: No discharge.        Left eye: No discharge.     Extraocular Movements: Extraocular movements intact.     Conjunctiva/sclera: Conjunctivae normal.  Cardiovascular:     Rate and Rhythm: Normal rate and regular rhythm.  Pulmonary:     Effort: Pulmonary effort is normal.     Breath sounds: Normal breath sounds.  Abdominal:     General: Bowel sounds are normal. There is no distension. There are no signs of injury.     Palpations: Abdomen is soft.     Tenderness: There is no abdominal tenderness.      Hernia: A hernia is present. Hernia is present in the ventral area. There is no hernia in the umbilical area, left inguinal area or right inguinal area.    Neurological:     Mental Status: He is alert.    BP 122/78 (BP Location: Left Arm, Patient Position: Sitting,  Cuff Size: Large)    Pulse 82    Temp (!) 96.9 F (36.1 C) (Temporal)    Ht 6\' 2"  (1.88 m)    Wt 248 lb 6.4 oz (112.7 kg)    SpO2 97%    BMI 31.89 kg/m  Wt Readings from Last 3 Encounters:  09/26/21 248 lb 6.4 oz (112.7 kg)  06/26/21 249 lb 6.4 oz (113.1 kg)  04/23/21 246 lb 3.2 oz (111.7 kg)     There are no preventive care reminders to display for this patient.  There are no preventive care reminders to display for this patient.  Lab Results  Component Value Date   TSH 1.73 04/12/2020   Lab Results  Component Value Date   WBC 8.9 06/26/2021   HGB 15.5 06/26/2021   HCT 47.7 06/26/2021   MCV 82.2 06/26/2021   PLT 209.0 06/26/2021   Lab Results  Component Value Date   NA 141 04/23/2021   K 4.3 04/23/2021   CO2 25 04/23/2021   GLUCOSE 92 04/23/2021   BUN 13 04/23/2021   CREATININE 1.03 04/23/2021   BILITOT 0.4 10/19/2020   ALKPHOS 52 10/19/2020   AST 22 10/19/2020   ALT 21 10/19/2020   PROT 6.6 10/19/2020   ALBUMIN 3.9 10/19/2020   CALCIUM 9.0 04/23/2021   ANIONGAP 8 03/14/2020   GFR 78.16 04/23/2021   Lab Results  Component Value Date   CHOL 149 10/19/2020   Lab Results  Component Value Date   HDL 38.30 (L) 10/19/2020   Lab Results  Component Value Date   LDLCALC 92 10/19/2020   Lab Results  Component Value Date   TRIG 98.0 10/19/2020   Lab Results  Component Value Date   CHOLHDL 4 10/19/2020   Lab Results  Component Value Date   HGBA1C 5.0 04/12/2020      Assessment & Plan:   Problem List Items Addressed This Visit       Cardiovascular and Mediastinum   Essential hypertension   Relevant Orders   Basic metabolic panel     Other   Anemia   Relevant Orders   CBC   Iron  deficiency - Primary   Relevant Orders   CBC   Iron, TIBC and Ferritin Panel   Other Visit Diagnoses     Ventral hernia without obstruction or gangrene           No orders of the defined types were placed in this encounter. Continue with Zestril and amlodipine for hypertension.  May be able to discontinue iron therapy. We will follow-up with urology and discuss hernia concerns with them. Follow-up: Return in about 6 months (around 03/26/2022), or if symptoms worsen or fail to improve.    Libby Maw, MD

## 2021-09-27 LAB — IRON,TIBC AND FERRITIN PANEL
%SAT: 31 % (calc) (ref 20–48)
Ferritin: 82 ng/mL (ref 24–380)
Iron: 101 ug/dL (ref 50–180)
TIBC: 327 mcg/dL (calc) (ref 250–425)

## 2021-09-27 LAB — BASIC METABOLIC PANEL
BUN: 14 mg/dL (ref 6–23)
CO2: 27 mEq/L (ref 19–32)
Calcium: 8.8 mg/dL (ref 8.4–10.5)
Chloride: 105 mEq/L (ref 96–112)
Creatinine, Ser: 1.16 mg/dL (ref 0.40–1.50)
GFR: 67.57 mL/min (ref >60.00)
Glucose, Bld: 86 mg/dL (ref 70–99)
Potassium: 4.5 mEq/L (ref 3.5–5.1)
Sodium: 138 mEq/L (ref 135–145)

## 2021-10-04 DIAGNOSIS — C61 Malignant neoplasm of prostate: Secondary | ICD-10-CM | POA: Diagnosis not present

## 2021-10-11 DIAGNOSIS — N5231 Erectile dysfunction following radical prostatectomy: Secondary | ICD-10-CM | POA: Diagnosis not present

## 2021-10-11 DIAGNOSIS — C61 Malignant neoplasm of prostate: Secondary | ICD-10-CM | POA: Diagnosis not present

## 2021-12-10 DIAGNOSIS — M792 Neuralgia and neuritis, unspecified: Secondary | ICD-10-CM | POA: Diagnosis not present

## 2021-12-21 ENCOUNTER — Other Ambulatory Visit: Payer: Self-pay | Admitting: Family Medicine

## 2021-12-21 DIAGNOSIS — L608 Other nail disorders: Secondary | ICD-10-CM | POA: Diagnosis not present

## 2021-12-21 DIAGNOSIS — B351 Tinea unguium: Secondary | ICD-10-CM | POA: Diagnosis not present

## 2021-12-21 DIAGNOSIS — I10 Essential (primary) hypertension: Secondary | ICD-10-CM

## 2021-12-28 NOTE — Progress Notes (Signed)
Jonathan Edwards Phone: [phone] Subjective:   Jonathan Edwards, am serving as a scribe for Dr. Hulan Saas.  This visit occurred during the SARS-CoV-2 public health emergency.  Safety protocols were in place, including screening questions prior to the visit, additional usage of staff PPE, and extensive cleaning of exam room while observing appropriate contact time as indicated for disinfecting solutions.   I'm seeing this patient by the request  of:  Libby Maw, MD  CC: Right knee pain  [id number]   Jonathan Edwards is a 61 y.o. male coming in with complaint of R knee pain and leg swelling. Last seen in 2020 for hand pain. Patient states that he feels like he has fluid on R knee. Swelling for one week. Works as Sports coach at [employer]. Edwards injury but is wearing some older shoes. Using IBU for pain relief.        Past Medical History:  Diagnosis Date   Cancer [facility])    prostate  2021   Cataract    GERD (gastroesophageal reflux disease)    Glaucoma    Hypertension    Pneumonia    Tuberculosis    + ppd test  when 61 years old never had any symptoms   Past Surgical History:  Procedure Laterality Date   COLONOSCOPY  2011   EYE SURGERY     hole repair left eye and detached retina surgery   HERNIA REPAIR     umbilical with mesh   LAPAROSCOPIC LYSIS OF ADHESIONS N/A 03/06/2020   Procedure: LAPAROSCOPIC LYSIS OF ADHESIONS;  Surgeon: Lucas Mallow, MD;  [facility];  Service: Urology;  Laterality: N/A;   PELVIC LYMPH NODE DISSECTION Bilateral 03/06/2020   Procedure: BILATERAL PELVIC LYMPH NODE DISSECTION;  Surgeon: Lucas Mallow, MD;  [facility];  Service: Urology;  Laterality: Bilateral;   ROBOT ASSISTED LAPAROSCOPIC RADICAL PROSTATECTOMY N/A 03/06/2020   Procedure: XI ROBOTIC ASSISTED LAPAROSCOPIC RADICAL PROSTATECTOMY;  Surgeon: Lucas Mallow, MD;  [facility];   Service: Urology;  Laterality: N/A;   TONSILLECTOMY     Social History   Socioeconomic History   Marital status: Married    Spouse name: Not on file   Number of children: Not on file   Years of education: Not on file   Highest education level: Not on file  Occupational History   Not on file  Tobacco Use   Smoking status: Former    Years: 30.00    Types: Cigarettes    Quit date: 08/12/1998    Years since quitting: 23.4   Smokeless tobacco: Never  Vaping Use   Vaping Use: Never used  Substance and Sexual Activity   Alcohol use: Yes    Comment: occasional   Drug use: Edwards   Sexual activity: Yes    Birth control/protection: None  Other Topics Concern   Not on file  Social History Narrative   Not on file   Social Determinants of Health   Financial Resource Strain: Not on file  Food Insecurity: Not on file  Transportation Needs: Not on file  Physical Activity: Not on file  Stress: Not on file  Social Connections: Not on file   Edwards Known Allergies Family History  Problem Relation Age of Onset   Colon cancer Neg Hx    Colon polyps Neg Hx    Esophageal cancer Neg Hx    Rectal cancer Neg Hx  Stomach cancer Neg Hx      Current Outpatient Medications (Cardiovascular):    amLODipine (NORVASC) 5 MG tablet, TAKE 1 TABLET (5 MG TOTAL) BY MOUTH DAILY.   lisinopril (ZESTRIL) 20 MG tablet, Take 1 tablet (20 mg total) by mouth daily.   Current Outpatient Medications (Analgesics):    acetaminophen (TYLENOL) 325 MG tablet, Take 650 mg by mouth every 6 (six) hours as needed for moderate pain or headache.  Current Outpatient Medications (Hematological):    Iron, Ferrous Sulfate, 325 (65 Fe) MG TABS, Take one twice daily.  Current Outpatient Medications (Other):    buPROPion (WELLBUTRIN XL) 150 MG 24 hr tablet, Take 1 tablet (150 mg total) by mouth daily.   cyclobenzaprine (FLEXERIL) 10 MG tablet, Take 10 mg by mouth 3 (three) times daily as needed for muscle spasms.    Flaxseed, Linseed, (FLAX SEEDS PO), Take by mouth.   magnesium 30 MG tablet, Take 30 mg by mouth daily.   Multiple Vitamin (MULTIVITAMIN WITH MINERALS) TABS tablet, Take 1 tablet by mouth daily.   Omega-3 Fatty Acids (OMEGA 3 PO), Take 1 capsule by mouth daily.   Probiotic CAPS, Take 1 capsule by mouth daily.   VITAMIN D PO, Take 1 capsule by mouth daily.   VITAMIN E PO, Take by mouth.   zinc gluconate 50 MG tablet, Take 50 mg by mouth daily.   Reviewed prior external information including notes and imaging from  primary care provider As well as notes that were available from care everywhere and other healthcare systems.  Past medical history, social, surgical and family history all reviewed in electronic medical record.  Edwards pertanent information unless stated regarding to the chief complaint.   Review of Systems:  Edwards headache, visual changes, nausea, vomiting, diarrhea, constipation, dizziness, abdominal pain, skin rash, fevers, chills, night sweats, weight loss, swollen lymph nodes, body aches,  chest pain, shortness of breath, mood changes. POSITIVE muscle aches, joint swelling  Objective  Blood pressure 118/84, pulse 89, height '6\' 2"'$  (1.88 m), weight 252 lb (114.3 kg), SpO2 98 %.   General: Edwards apparent distress alert and oriented x3 mood and affect normal, dressed appropriately.  HEENT: Pupils equal, extraocular movements intact  Respiratory: Patient's speak in full sentences and does not appear short of breath  Cardiovascular: Edwards lower extremity edema, non tender, Edwards erythema  Gait mildly antalgic Right knee exam does have effusion noted.  Patient lacks last 10 degrees of flexion.  Patient does have tenderness to palpation more in the posterior aspect of the knee laterally. Mild positive McMurray's noted.  Patient though does have full range of motion.  Limited muscular skeletal ultrasound was performed and interpreted by Hulan Saas, M  Limited ultrasound does show narrowing  of the patellofemoral joint with effusion noted and mild synovitis.  Patient's lateral meniscus does still have some tearing and appears to have an acute on chronic hypoechoic changes noted. 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 Right knee was prepped with alcohol swab and utilizing anterolateral approach, patient's right knee space was injected with 4:1  marcaine 0.5%: Kenalog '40mg'$ /dL. Patient tolerated the procedure well without immediate complications.   Impression and Recommendations:     The above documentation has been reviewed and is accurate and complete Lyndal Pulley, DO

## 2021-12-31 ENCOUNTER — Encounter: Payer: Self-pay | Admitting: Family Medicine

## 2021-12-31 ENCOUNTER — Ambulatory Visit (INDEPENDENT_AMBULATORY_CARE_PROVIDER_SITE_OTHER): Payer: Federal, State, Local not specified - PPO

## 2021-12-31 ENCOUNTER — Ambulatory Visit: Payer: Federal, State, Local not specified - PPO | Admitting: Family Medicine

## 2021-12-31 ENCOUNTER — Ambulatory Visit: Payer: Self-pay

## 2021-12-31 VITALS — BP 118/84 | HR 89 | Ht 74.0 in | Wt 252.0 lb

## 2021-12-31 DIAGNOSIS — S83261A Peripheral tear of lateral meniscus, current injury, right knee, initial encounter: Secondary | ICD-10-CM | POA: Diagnosis not present

## 2021-12-31 DIAGNOSIS — M25561 Pain in right knee: Secondary | ICD-10-CM | POA: Diagnosis not present

## 2021-12-31 NOTE — Assessment & Plan Note (Signed)
Injection given today and tolerated the procedure well, no significant abnormality other than effusion in the lateral meniscal tear.  Chronic problem with likely exacerbation.  She did respond relatively well though to steroid injection that was given today.  Hopefully patient will make significant improvement.  Follow-up with me again 6 weeks to make sure he is improving

## 2021-12-31 NOTE — Patient Instructions (Addendum)
Xray today Knee exercises today Injected R knee today See me again in 6-8 weeks

## 2022-01-14 DIAGNOSIS — C61 Malignant neoplasm of prostate: Secondary | ICD-10-CM | POA: Diagnosis not present

## 2022-01-17 DIAGNOSIS — C61 Malignant neoplasm of prostate: Secondary | ICD-10-CM | POA: Diagnosis not present

## 2022-01-17 DIAGNOSIS — N5231 Erectile dysfunction following radical prostatectomy: Secondary | ICD-10-CM | POA: Diagnosis not present

## 2022-01-17 DIAGNOSIS — N393 Stress incontinence (female) (male): Secondary | ICD-10-CM | POA: Diagnosis not present

## 2022-01-24 DIAGNOSIS — M792 Neuralgia and neuritis, unspecified: Secondary | ICD-10-CM | POA: Diagnosis not present

## 2022-02-19 NOTE — Progress Notes (Unsigned)
[facility] [place] [address] [place] Phone: [phone] Subjective:   IVilma Meckel, am serving as a scribe for Dr. Hulan Saas.  I'm seeing this patient by the request  of:  Libby Maw, MD  CC: right knee pain   [id number]  12/31/2021 Injection given today and tolerated the procedure well, no significant abnormality other than effusion in the lateral meniscal tear.  Chronic problem with likely exacerbation.  She did respond relatively well though to steroid injection that was given today.  Hopefully patient will make significant improvement.  Follow-up with me again 6 weeks to make sure he is improving  Update 02/20/2022 Jonathan Edwards is a 61 y.o. male coming in with complaint of R knee pain. Patient states doing great. No pain at the moment. Little bit of instability and momentary sharp pains during the last week or so. No new complaints would state that overall it is 100% at the moment.     Past Medical History:  Diagnosis Date   Cancer [facility])    prostate  2021   Cataract    GERD (gastroesophageal reflux disease)    Glaucoma    Hypertension    Pneumonia    Tuberculosis    + ppd test  when 61 years old never had any symptoms   Past Surgical History:  Procedure Laterality Date   COLONOSCOPY  2011   EYE SURGERY     hole repair left eye and detached retina surgery   HERNIA REPAIR     umbilical with mesh   LAPAROSCOPIC LYSIS OF ADHESIONS N/A 03/06/2020   Procedure: LAPAROSCOPIC LYSIS OF ADHESIONS;  Surgeon: Lucas Mallow, MD;  [facility];  Service: Urology;  Laterality: N/A;   PELVIC LYMPH NODE DISSECTION Bilateral 03/06/2020   Procedure: BILATERAL PELVIC LYMPH NODE DISSECTION;  Surgeon: Lucas Mallow, MD;  [facility];  Service: Urology;  Laterality: Bilateral;   ROBOT ASSISTED LAPAROSCOPIC RADICAL PROSTATECTOMY N/A 03/06/2020   Procedure: XI ROBOTIC ASSISTED LAPAROSCOPIC RADICAL PROSTATECTOMY;   Surgeon: Lucas Mallow, MD;  [facility];  Service: Urology;  Laterality: N/A;   TONSILLECTOMY     Social History   Socioeconomic History   Marital status: Married    Spouse name: Not on file   Number of children: Not on file   Years of education: Not on file   Highest education level: Not on file  Occupational History   Not on file  Tobacco Use   Smoking status: Former    Years: 30.00    Types: Cigarettes    Quit date: 08/12/1998    Years since quitting: 23.5   Smokeless tobacco: Never  Vaping Use   Vaping Use: Never used  Substance and Sexual Activity   Alcohol use: Yes    Comment: occasional   Drug use: No   Sexual activity: Yes    Birth control/protection: None  Other Topics Concern   Not on file  Social History Narrative   Not on file   Social Determinants of Health   Financial Resource Strain: Not on file  Food Insecurity: Not on file  Transportation Needs: Not on file  Physical Activity: Not on file  Stress: Not on file  Social Connections: Not on file   No Known Allergies Family History  Problem Relation Age of Onset   Colon cancer Neg Hx    Colon polyps Neg Hx    Esophageal cancer Neg Hx  Rectal cancer Neg Hx    Stomach cancer Neg Hx      Current Outpatient Medications (Cardiovascular):    amLODipine (NORVASC) 5 MG tablet, TAKE 1 TABLET (5 MG TOTAL) BY MOUTH DAILY.   lisinopril (ZESTRIL) 20 MG tablet, Take 1 tablet (20 mg total) by mouth daily.   Current Outpatient Medications (Analgesics):    acetaminophen (TYLENOL) 325 MG tablet, Take 650 mg by mouth every 6 (six) hours as needed for moderate pain or headache.  Current Outpatient Medications (Hematological):    Iron, Ferrous Sulfate, 325 (65 Fe) MG TABS, Take one twice daily.  Current Outpatient Medications (Other):    buPROPion (WELLBUTRIN XL) 150 MG 24 hr tablet, Take 1 tablet (150 mg total) by mouth daily.   cyclobenzaprine (FLEXERIL) 10 MG tablet, Take 10 mg by mouth 3 (three)  times daily as needed for muscle spasms.   Flaxseed, Linseed, (FLAX SEEDS PO), Take by mouth.   magnesium 30 MG tablet, Take 30 mg by mouth daily.   Multiple Vitamin (MULTIVITAMIN WITH MINERALS) TABS tablet, Take 1 tablet by mouth daily.   Omega-3 Fatty Acids (OMEGA 3 PO), Take 1 capsule by mouth daily.   Probiotic CAPS, Take 1 capsule by mouth daily.   VITAMIN D PO, Take 1 capsule by mouth daily.   VITAMIN E PO, Take by mouth.   zinc gluconate 50 MG tablet, Take 50 mg by mouth daily.     Objective  Blood pressure 128/86, pulse 93, height '6\' 2"'$  (1.88 m), weight 242 lb (109.8 kg), SpO2 94 %.   General: No apparent distress alert and oriented x3 mood and affect normal, dressed appropriately.  HEENT: Pupils equal, extraocular movements intact  Respiratory: Patient's speak in full sentences and does not appear short of breath  Cardiovascular: No lower extremity edema, non tender, no erythema  Right knee exam does have some still tenderness to palpation more over the lateral aspect of the knee.  Still has a trace effusion noted but improvement noted.  Mild positive McMurray's noted.  Good range of motion though otherwise noted.    Impression and Recommendations:    The above documentation has been reviewed and is accurate and complete Lyndal Pulley, DO

## 2022-02-20 ENCOUNTER — Ambulatory Visit (INDEPENDENT_AMBULATORY_CARE_PROVIDER_SITE_OTHER): Payer: Federal, State, Local not specified - PPO | Admitting: Family Medicine

## 2022-02-20 DIAGNOSIS — S83261A Peripheral tear of lateral meniscus, current injury, right knee, initial encounter: Secondary | ICD-10-CM

## 2022-02-20 NOTE — Assessment & Plan Note (Signed)
Found to have more of a lateral meniscal tear.  Did have effusion of the knee noted but not as much of the significant synovitis.  Has responded well though to the injection.  Discussed with patient about continuing to avoid twisting motions if possible.  Discussed home exercises and icing regimen.  Discussed compression movement may be helpful as well.  Patient given note for work that there is always a possibility for some mild exacerbations over the next 12 months but hopefully this will be highly unlikely.  Worsening symptoms may need to consider advanced imaging.  Follow-up with me as needed otherwise.

## 2022-02-20 NOTE — Patient Instructions (Signed)
Good to see you! Make sure you spoil your better half I think you're going to be perfect but, See you again in 2 months just in case

## 2022-02-21 ENCOUNTER — Other Ambulatory Visit: Payer: Self-pay | Admitting: Family Medicine

## 2022-02-21 DIAGNOSIS — F341 Dysthymic disorder: Secondary | ICD-10-CM

## 2022-03-26 ENCOUNTER — Encounter: Payer: Self-pay | Admitting: Family Medicine

## 2022-03-26 ENCOUNTER — Ambulatory Visit: Payer: Federal, State, Local not specified - PPO | Admitting: Family Medicine

## 2022-03-26 VITALS — BP 122/78 | HR 75 | Temp 97.1°F | Ht 74.0 in | Wt 242.0 lb

## 2022-03-26 DIAGNOSIS — I10 Essential (primary) hypertension: Secondary | ICD-10-CM | POA: Diagnosis not present

## 2022-03-26 DIAGNOSIS — Z13220 Encounter for screening for lipoid disorders: Secondary | ICD-10-CM | POA: Diagnosis not present

## 2022-03-26 DIAGNOSIS — E611 Iron deficiency: Secondary | ICD-10-CM

## 2022-03-26 DIAGNOSIS — N5201 Erectile dysfunction due to arterial insufficiency: Secondary | ICD-10-CM | POA: Diagnosis not present

## 2022-03-26 DIAGNOSIS — M25561 Pain in right knee: Secondary | ICD-10-CM

## 2022-03-26 LAB — COMPREHENSIVE METABOLIC PANEL
ALT: 21 U/L (ref 0–53)
AST: 23 U/L (ref 0–37)
Albumin: 4 g/dL (ref 3.5–5.2)
Alkaline Phosphatase: 52 U/L (ref 39–117)
BUN: 13 mg/dL (ref 6–23)
CO2: 27 mEq/L (ref 19–32)
Calcium: 8.9 mg/dL (ref 8.4–10.5)
Chloride: 105 mEq/L (ref 96–112)
Creatinine, Ser: 1.04 mg/dL (ref 0.40–1.50)
GFR: 76.76 mL/min (ref >60.00)
Glucose, Bld: 92 mg/dL (ref 70–99)
Potassium: 4 mEq/L (ref 3.5–5.1)
Sodium: 137 mEq/L (ref 135–145)
Total Bilirubin: 0.4 mg/dL (ref 0.2–1.2)
Total Protein: 6.6 g/dL (ref 6.0–8.3)

## 2022-03-26 LAB — CBC
HCT: 46.1 % (ref 39.0–52.0)
Hemoglobin: 15 g/dL (ref 13.0–17.0)
MCHC: 32.6 g/dL (ref 30.0–36.0)
MCV: 81.6 fl (ref 78.0–100.0)
Platelets: 192 10*3/uL (ref 150.0–400.0)
RBC: 5.65 Mil/uL (ref 4.22–5.81)
RDW: 14.7 % (ref 11.5–15.5)
WBC: 7.2 10*3/uL (ref 4.0–10.5)

## 2022-03-26 LAB — URINALYSIS, ROUTINE W REFLEX MICROSCOPIC
Bilirubin Urine: NEGATIVE
Hgb urine dipstick: NEGATIVE
Ketones, ur: NEGATIVE
Leukocytes,Ua: NEGATIVE
Nitrite: NEGATIVE
RBC / HPF: NONE SEEN (ref 0–?)
Specific Gravity, Urine: 1.015 (ref 1.000–1.030)
Total Protein, Urine: NEGATIVE
Urine Glucose: NEGATIVE
Urobilinogen, UA: 0.2 (ref 0.0–1.0)
pH: 8 (ref 5.0–8.0)

## 2022-03-26 LAB — LIPID PANEL
Cholesterol: 153 mg/dL (ref 0–200)
HDL: 36.1 mg/dL — ABNORMAL LOW (ref >39.00)
LDL Cholesterol: 82 mg/dL (ref 0–99)
NonHDL: 117.16
Total CHOL/HDL Ratio: 4
Triglycerides: 174 mg/dL — ABNORMAL HIGH (ref 0.0–149.0)
VLDL: 34.8 mg/dL (ref 0.0–40.0)

## 2022-03-26 MED ORDER — DICLOFENAC SODIUM 1 % EX GEL: CUTANEOUS | 2 refills | Status: DC

## 2022-03-26 NOTE — Progress Notes (Signed)
   Established Patient Office Visit  Subjective   Patient ID: Jonathan Edwards, male    DOB: 1961-08-09  Age: 61 y.o. MRN: [id number]  Chief Complaint  Patient presents with   Follow-up    6 month follow up, would like something for inflammation in right knee. Patient fasting.     HPI for follow-up of hypertension, iron deficiency and right knee pain.  Continues to see sports medicine for knee pain.  He is in physical therapy.  Still tends to bother him at times and would like something for it.  Blood pressure well controlled with amlodipine and lisinopril.  Tolerating medications well.  While he has full urinary continence erectile dysfunction continues to be an issue even with ongoing treatment.    Review of Systems  Constitutional: Negative.   HENT: Negative.    Eyes:  Negative for blurred vision, discharge and redness.  Respiratory: Negative.    Cardiovascular: Negative.   Gastrointestinal:  Negative for abdominal pain.  Genitourinary: Negative.   Musculoskeletal:  Positive for joint pain. Negative for myalgias.  Skin:  Negative for rash.  Neurological:  Negative for tingling, loss of consciousness and weakness.  Endo/Heme/Allergies:  Negative for polydipsia.      Objective:     BP 122/78 (BP Location: Left Arm, Patient Position: Sitting, Cuff Size: Large)   Pulse 75   Temp (!) 97.1 F (36.2 C) (Temporal)   Ht '6\' 2"'$  (1.88 m)   Wt 242 lb (109.8 kg)   SpO2 97%   BMI 31.07 kg/m    Physical Exam   No results found for any visits on 03/26/22.    The 10-year ASCVD risk score (Arnett DK, et al., 2019) is: 13.2%    Assessment & Plan:   Problem List Items Addressed This Visit       Cardiovascular and Mediastinum   Essential hypertension   Relevant Orders   CBC   Comprehensive metabolic panel   Urinalysis, Routine w reflex microscopic   Erectile dysfunction due to arterial insufficiency     Other   Right knee pain   Relevant Medications   diclofenac Sodium  (VOLTAREN) 1 % GEL   Iron deficiency - Primary   Relevant Orders   CBC   Iron, TIBC and Ferritin Panel   Other Visit Diagnoses     Screening for cholesterol level       Relevant Orders   Lipid panel       Return in about 1 year (around 03/27/2023), or if symptoms worsen or fail to improve.  May be able to discontinue iron therapy.  Lisinopril amlodipine.  We will try Voltaren gel for knee as needed.  Continue follow-up with Dr. Tamala Julian.  Libby Maw, MD

## 2022-03-27 LAB — IRON,TIBC AND FERRITIN PANEL
%SAT: 27 % (calc) (ref 20–48)
Ferritin: 101 ng/mL (ref 24–380)
Iron: 79 ug/dL (ref 50–180)
TIBC: 296 mcg/dL (calc) (ref 250–425)

## 2022-04-23 NOTE — Progress Notes (Unsigned)
[place] Phone: [phone] Subjective:   Fontaine No, am serving as a scribe for Dr. Hulan Saas.   I'm seeing this patient by the request  of:  Libby Maw, MD  CC: right knee pain   [id number]  02/20/2022 Found to have more of a lateral meniscal tear.  Did have effusion of the knee noted but not as much of the significant synovitis.  Has responded well though to the injection.  Discussed with patient about continuing to avoid twisting motions if possible.  Discussed home exercises and icing regimen.  Discussed compression movement may be helpful as well.  Patient given note for work that there is always a possibility for some mild exacerbations over the next 12 months but hopefully this will be highly unlikely.  Worsening symptoms may need to consider advanced imaging.  Follow-up with me as needed otherwise.  Update 04/24/2022 Edith Seubert is a 61 y.o. male coming in with complaint of R knee, lateral meniscus tear. Patient states that he is doing somewhat better. Has good and bad days. Injections are helpful. Walks on concrete throughout the day.      Past Medical History:  Diagnosis Date   Cancer [facility])    prostate  2021   Cataract    GERD (gastroesophageal reflux disease)    Glaucoma    Hypertension    Pneumonia    Tuberculosis    + ppd test  when 61 years old never had any symptoms   Past Surgical History:  Procedure Laterality Date   COLONOSCOPY  2011   EYE SURGERY     hole repair left eye and detached retina surgery   HERNIA REPAIR     umbilical with mesh   LAPAROSCOPIC LYSIS OF ADHESIONS N/A 03/06/2020   Procedure: LAPAROSCOPIC LYSIS OF ADHESIONS;  Surgeon: Lucas Mallow, MD;  Location: [facility];  Service: Urology;  Laterality: N/A;   PELVIC LYMPH NODE DISSECTION Bilateral 03/06/2020   Procedure: BILATERAL PELVIC LYMPH NODE DISSECTION;  Surgeon: Lucas Mallow, MD;   Location: [facility];  Service: Urology;  Laterality: Bilateral;   ROBOT ASSISTED LAPAROSCOPIC RADICAL PROSTATECTOMY N/A 03/06/2020   Procedure: XI ROBOTIC ASSISTED LAPAROSCOPIC RADICAL PROSTATECTOMY;  Surgeon: Lucas Mallow, MD;  Location: [facility];  Service: Urology;  Laterality: N/A;   TONSILLECTOMY     Social History   Socioeconomic History   Marital status: Married    Spouse name: Not on file   Number of children: Not on file   Years of education: Not on file   Highest education level: Not on file  Occupational History   Not on file  Tobacco Use   Smoking status: Former    Years: 30.00    Types: Cigarettes    Quit date: 08/12/1998    Years since quitting: 23.7   Smokeless tobacco: Never  Vaping Use   Vaping Use: Never used  Substance and Sexual Activity   Alcohol use: Yes    Comment: occasional   Drug use: No   Sexual activity: Yes    Birth control/protection: None  Other Topics Concern   Not on file  Social History Narrative   Not on file   Social Determinants of Health   Financial Resource Strain: Not on file  Food Insecurity: Not on file  Transportation Needs: Not on file  Physical Activity: Not on file  Stress: Not on file  Social Connections: Not  on file   No Known Allergies Family History  Problem Relation Age of Onset   Colon cancer Neg Hx    Colon polyps Neg Hx    Esophageal cancer Neg Hx    Rectal cancer Neg Hx    Stomach cancer Neg Hx      Current Outpatient Medications (Cardiovascular):    amLODipine (NORVASC) 5 MG tablet, TAKE 1 TABLET (5 MG TOTAL) BY MOUTH DAILY.   lisinopril (ZESTRIL) 20 MG tablet, Take 1 tablet (20 mg total) by mouth daily.   Current Outpatient Medications (Analgesics):    acetaminophen (TYLENOL) 325 MG tablet, Take 650 mg by mouth every 6 (six) hours as needed for moderate pain or headache.  Current Outpatient Medications (Hematological):    Iron, Ferrous Sulfate, 325 (65 Fe) MG TABS, Take one twice daily.  Current  Outpatient Medications (Other):    Bioflavonoid Products (BIOFLEX PO), Take by mouth.   buPROPion (WELLBUTRIN XL) 150 MG 24 hr tablet, TAKE 1 TABLET BY MOUTH EVERY DAY   cyclobenzaprine (FLEXERIL) 10 MG tablet, Take 10 mg by mouth 3 (three) times daily as needed for muscle spasms.   diclofenac Sodium (VOLTAREN) 1 % GEL, Apply a small grape sized dollop to right knee 4 times daily as needed for pain.   Flaxseed, Linseed, (FLAX SEEDS PO), Take by mouth.   Ginger, Zingiber officinalis, (GINGER PO), Take by mouth.   magnesium 30 MG tablet, Take 30 mg by mouth daily.   Multiple Vitamin (MULTIVITAMIN WITH MINERALS) TABS tablet, Take 1 tablet by mouth daily.   Omega-3 Fatty Acids (OMEGA 3 PO), Take 1 capsule by mouth daily.   Probiotic CAPS, Take 1 capsule by mouth daily.   VITAMIN D PO, Take 1 capsule by mouth daily.   VITAMIN E PO, Take by mouth.   zinc gluconate 50 MG tablet, Take 50 mg by mouth daily.   Reviewed prior external information including notes and imaging from  primary care provider As well as notes that were available from care everywhere and other healthcare systems.  Past medical history, social, surgical and family history all reviewed in electronic medical record.  No pertanent information unless stated regarding to the chief complaint.   Review of Systems:  No headache, visual changes, nausea, vomiting, diarrhea, constipation, dizziness, abdominal pain, skin rash, fevers, chills, night sweats, weight loss, swollen lymph nodes, body aches, joint swelling, chest pain, shortness of breath, mood changes. POSITIVE muscle aches  Objective  Blood pressure (!) 124/92, pulse 73, height '6\' 2"'$  (1.88 m), weight 244 lb (110.7 kg), SpO2 98 %.   General: No apparent distress alert and oriented x3 mood and affect normal, dressed appropriately.  HEENT: Pupils equal, extraocular movements intact  Respiratory: Patient's speak in full sentences and does not appear short of breath   Cardiovascular: No lower extremity edema, non tender, no erythema  Antalgic gait  Right knee exam does have tenderness to palpation over the lateral, positive McMurray's.  Lacks last 10 degrees of flexion.  Limited muscular skeletal ultrasound was performed and interpreted by Hulan Saas, M  Limited musculoskeletal ultrasound was performed by me showing the patient does have hypoechoic changes of the patellofemoral joint.  Patient does not have what appears to be a synovitis as well.  Does have narrowing of the lateral compartment with displacement of the lateral meniscus with degenerative tearing noted. Impression: Synovitis noted in the patellofemoral joint   After informed written and verbal consent, patient was seated on exam table. Right knee was  prepped with alcohol swab and utilizing anterolateral approach, patient's right knee space was injected with 4:1  marcaine 0.5%: Kenalog '40mg'$ /dL. Patient tolerated the procedure well without immediate complications.   Impression and Recommendations:    The above documentation has been reviewed and is accurate and complete Lyndal Pulley, DO

## 2022-04-24 ENCOUNTER — Ambulatory Visit: Payer: Self-pay

## 2022-04-24 ENCOUNTER — Ambulatory Visit: Payer: Federal, State, Local not specified - PPO | Admitting: Family Medicine

## 2022-04-24 ENCOUNTER — Encounter: Payer: Self-pay | Admitting: Family Medicine

## 2022-04-24 VITALS — BP 124/92 | HR 73 | Ht 74.0 in | Wt 244.0 lb

## 2022-04-24 DIAGNOSIS — S83261A Peripheral tear of lateral meniscus, current injury, right knee, initial encounter: Secondary | ICD-10-CM

## 2022-04-24 DIAGNOSIS — M25561 Pain in right knee: Secondary | ICD-10-CM

## 2022-04-24 NOTE — Assessment & Plan Note (Signed)
Patient given another injection today.  Tolerated the procedure well.  Did on ultrasound to have a synovitis that we will need to continue to monitor as well.  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  May need to consider a lateral unloader brace if necessary.  Follow-up with me again in 6 to 8 weeks.

## 2022-04-24 NOTE — Patient Instructions (Addendum)
Injected R knee today If not better in 2 weeks consider MRI See me in 3 months

## 2022-06-04 NOTE — Progress Notes (Unsigned)
[place] Phone: [phone] Subjective:   Fontaine No, am serving as a scribe for Dr. Hulan Saas.  I'm seeing this patient by the request  of:  Libby Maw, MD  CC: Right knee pain follow-up  [id number]  04/24/2022 Patient given another injection today.  Tolerated the procedure well.  Did on ultrasound to have a synovitis that we will need to continue to monitor as well.  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  May need to consider a lateral unloader brace if necessary.  Follow-up with me again in 6 to 8 weeks.  Update 06/05/2022 Demonie Gamel is a 61 y.o. male coming in with complaint of R knee pain. Patient states that his pain is intermittent. Developed swelling over distal lateral quad. Notes tightness in back of knee and soreness over medial aspect.Marland Kitchen Has been trying to ice.  Patient has been doing the home exercises and feelings he did get some benefit from some of the physical therapy.  Patient does still states that there is some instability noted at this point.     Past Medical History:  Diagnosis Date   Cancer [facility])    prostate  2021   Cataract    GERD (gastroesophageal reflux disease)    Glaucoma    Hypertension    Pneumonia    Tuberculosis    + ppd test  when 61 years old never had any symptoms   Past Surgical History:  Procedure Laterality Date   COLONOSCOPY  2011   EYE SURGERY     hole repair left eye and detached retina surgery   HERNIA REPAIR     umbilical with mesh   LAPAROSCOPIC LYSIS OF ADHESIONS N/A 03/06/2020   Procedure: LAPAROSCOPIC LYSIS OF ADHESIONS;  Surgeon: Lucas Mallow, MD;  [facility];  Service: Urology;  Laterality: N/A;   PELVIC LYMPH NODE DISSECTION Bilateral 03/06/2020   Procedure: BILATERAL PELVIC LYMPH NODE DISSECTION;  Surgeon: Lucas Mallow, MD;  [facility];  Service:  Urology;  Laterality: Bilateral;   ROBOT ASSISTED LAPAROSCOPIC RADICAL PROSTATECTOMY N/A 03/06/2020   Procedure: XI ROBOTIC ASSISTED LAPAROSCOPIC RADICAL PROSTATECTOMY;  Surgeon: Lucas Mallow, MD;  [facility];  Service: Urology;  Laterality: N/A;   TONSILLECTOMY     Social History   Socioeconomic History   Marital status: Married    Spouse name: Not on file   Number of children: Not on file   Years of education: Not on file   Highest education level: Not on file  Occupational History   Not on file  Tobacco Use   Smoking status: Former    Years: 30.00    Types: Cigarettes    Quit date: 08/12/1998    Years since quitting: 23.8   Smokeless tobacco: Never  Vaping Use   Vaping Use: Never used  Substance and Sexual Activity   Alcohol use: Yes    Comment: occasional   Drug use: No   Sexual activity: Yes    Birth control/protection: None  Other Topics Concern   Not on file  Social History Narrative   Not on file   Social Determinants of Health   Financial Resource Strain: Not on file  Food Insecurity: Not on file  Transportation Needs: Not on file  Physical Activity: Not on file  Stress: Not on file  Social Connections: Not on file  No Known Allergies Family History  Problem Relation Age of Onset   Colon cancer Neg Hx    Colon polyps Neg Hx    Esophageal cancer Neg Hx    Rectal cancer Neg Hx    Stomach cancer Neg Hx      Current Outpatient Medications (Cardiovascular):    amLODipine (NORVASC) 5 MG tablet, TAKE 1 TABLET (5 MG TOTAL) BY MOUTH DAILY.   lisinopril (ZESTRIL) 20 MG tablet, Take 1 tablet (20 mg total) by mouth daily.   Current Outpatient Medications (Analgesics):    acetaminophen (TYLENOL) 325 MG tablet, Take 650 mg by mouth every 6 (six) hours as needed for moderate pain or headache.  Current Outpatient Medications (Hematological):    Iron, Ferrous Sulfate, 325 (65 Fe) MG TABS, Take one twice daily.  Current Outpatient Medications  (Other):    Bioflavonoid Products (BIOFLEX PO), Take by mouth.   buPROPion (WELLBUTRIN XL) 150 MG 24 hr tablet, TAKE 1 TABLET BY MOUTH EVERY DAY   cyclobenzaprine (FLEXERIL) 10 MG tablet, Take 10 mg by mouth 3 (three) times daily as needed for muscle spasms.   diclofenac Sodium (VOLTAREN) 1 % GEL, Apply a small grape sized dollop to right knee 4 times daily as needed for pain.   Flaxseed, Linseed, (FLAX SEEDS PO), Take by mouth.   Ginger, Zingiber officinalis, (GINGER PO), Take by mouth.   magnesium 30 MG tablet, Take 30 mg by mouth daily.   Multiple Vitamin (MULTIVITAMIN WITH MINERALS) TABS tablet, Take 1 tablet by mouth daily.   Omega-3 Fatty Acids (OMEGA 3 PO), Take 1 capsule by mouth daily.   Probiotic CAPS, Take 1 capsule by mouth daily.   VITAMIN D PO, Take 1 capsule by mouth daily.   VITAMIN E PO, Take by mouth.   zinc gluconate 50 MG tablet, Take 50 mg by mouth daily.   Reviewed prior external information including notes and imaging from  primary care provider As well as notes that were available from care everywhere and other healthcare systems.  Past medical history, social, surgical and family history all reviewed in electronic medical record.  No pertanent information unless stated regarding to the chief complaint.   Review of Systems:  No headache, visual changes, nausea, vomiting, diarrhea, constipation, dizziness, abdominal pain, skin rash, fevers, chills, night sweats, weight loss, swollen lymph nodes, body aches, chest pain, shortness of breath, mood changes. POSITIVE muscle aches, joint swelling   Objective  Blood pressure 128/88, pulse 88, height '6\' 2"'$  (1.88 m), weight 263 lb (119.3 kg), SpO2 97 %.   General: No apparent distress alert and oriented x3 mood and affect normal, dressed appropriately.  HEENT: Pupils equal, extraocular movements intact  Respiratory: Patient's speak in full sentences and does not appear short of breath  Cardiovascular: No lower extremity  edema, non tender, no erythema  Patient does have some instability of the right knee.  Positive McMurray's noted with internal and external range of motion.  Lacks last 5 degrees of extension noted today.  Limited muscular skeletal ultrasound was performed and interpreted by Hulan Saas, M  Limited ultrasound of patient's knee still shows that patient does have an effusion noted.  Patient does have abnormality noted of the medial and lateral but difficult to assess true tearing at this time.  Very small possible Baker's cyst noted. Impression: Continued effusion of the right knee    Impression and Recommendations:    The above documentation has been reviewed and is accurate and complete Lyndal Pulley,  DO

## 2022-06-05 ENCOUNTER — Ambulatory Visit: Payer: Federal, State, Local not specified - PPO | Admitting: Family Medicine

## 2022-06-05 ENCOUNTER — Encounter: Payer: Self-pay | Admitting: Family Medicine

## 2022-06-05 ENCOUNTER — Ambulatory Visit: Payer: Self-pay

## 2022-06-05 VITALS — BP 128/88 | HR 88 | Ht 74.0 in | Wt 263.0 lb

## 2022-06-05 DIAGNOSIS — S83261A Peripheral tear of lateral meniscus, current injury, right knee, initial encounter: Secondary | ICD-10-CM | POA: Diagnosis not present

## 2022-06-05 DIAGNOSIS — M25561 Pain in right knee: Secondary | ICD-10-CM | POA: Diagnosis not present

## 2022-06-05 NOTE — Patient Instructions (Signed)
MRI  R knee [id number] We will be in touch

## 2022-06-05 NOTE — Assessment & Plan Note (Signed)
Patient has had difficulty with this previously.  Has had multiple injections, failed greater than 6 months of conservative therapy including injections, oral anti-inflammatories, icing regimen, topical anti-inflammatories home and formal physical therapy.  With increasing instability I do feel advanced imaging is warranted to further evaluate if any surgical intervention would be beneficial or other types of injections.  Patient is in agreement with the plan and will follow-up with me again after imaging to discuss further.

## 2022-06-16 ENCOUNTER — Ambulatory Visit
Admission: RE | Admit: 2022-06-16 | Discharge: 2022-06-16 | Disposition: A | Discharge status: Home / Self Care | Payer: Federal, State, Local not specified - PPO | Source: Ambulatory Visit | Attending: Family Medicine | Admitting: Family Medicine

## 2022-06-16 ENCOUNTER — Other Ambulatory Visit: Payer: Federal, State, Local not specified - PPO

## 2022-06-16 DIAGNOSIS — M25561 Pain in right knee: Secondary | ICD-10-CM

## 2022-06-16 DIAGNOSIS — M25461 Effusion, right knee: Secondary | ICD-10-CM | POA: Diagnosis not present

## 2022-06-16 DIAGNOSIS — G8929 Other chronic pain: Secondary | ICD-10-CM | POA: Diagnosis not present

## 2022-06-16 DIAGNOSIS — S83271A Complex tear of lateral meniscus, current injury, right knee, initial encounter: Secondary | ICD-10-CM | POA: Diagnosis not present

## 2022-06-16 DIAGNOSIS — M1711 Unilateral primary osteoarthritis, right knee: Secondary | ICD-10-CM | POA: Diagnosis not present

## 2022-06-20 ENCOUNTER — Other Ambulatory Visit: Payer: Self-pay | Admitting: Family Medicine

## 2022-06-20 DIAGNOSIS — I10 Essential (primary) hypertension: Secondary | ICD-10-CM

## 2022-07-11 DIAGNOSIS — C61 Malignant neoplasm of prostate: Secondary | ICD-10-CM | POA: Diagnosis not present

## 2022-07-18 NOTE — Progress Notes (Deleted)
[name] Phone: [phone] Subjective:    I'm seeing this patient by the request  of:  Libby Maw, MD  CC:   [id number]  06/05/2022 Patient has had difficulty with this previously.  Has had multiple injections, failed greater than 6 months of conservative therapy including injections, oral anti-inflammatories, icing regimen, topical anti-inflammatories home and formal physical therapy.  With increasing instability I do feel advanced imaging is warranted to further evaluate if any surgical intervention would be beneficial or other types of injections.  Patient is in agreement with the plan and will follow-up with me again after imaging to discuss further.      Update 07/24/2022 Jonathan Edwards is a 61 y.o. male coming in with complaint of R knee pain. Patient states        Past Medical History:  Diagnosis Date   Cancer [facility])    prostate  2021   Cataract    GERD (gastroesophageal reflux disease)    Glaucoma    Hypertension    Pneumonia    Tuberculosis    + ppd test  when 61 years old never had any symptoms   Past Surgical History:  Procedure Laterality Date   COLONOSCOPY  2011   EYE SURGERY     hole repair left eye and detached retina surgery   HERNIA REPAIR     umbilical with mesh   LAPAROSCOPIC LYSIS OF ADHESIONS N/A 03/06/2020   Procedure: LAPAROSCOPIC LYSIS OF ADHESIONS;  Surgeon: Lucas Mallow, MD;  [facility];  Service: Urology;  Laterality: N/A;   PELVIC LYMPH NODE DISSECTION Bilateral 03/06/2020   Procedure: BILATERAL PELVIC LYMPH NODE DISSECTION;  Surgeon: Lucas Mallow, MD;  [facility]: [facility];  Service: Urology;  Laterality: Bilateral;   ROBOT ASSISTED LAPAROSCOPIC RADICAL PROSTATECTOMY N/A 03/06/2020   Procedure: XI ROBOTIC ASSISTED LAPAROSCOPIC RADICAL PROSTATECTOMY;  Surgeon: Lucas Mallow, MD;  [facility];  Service: Urology;  Laterality: N/A;    TONSILLECTOMY     Social History   Socioeconomic History   Marital status: Married    Spouse name: Not on file   Number of children: Not on file   Years of education: Not on file   Highest education level: Not on file  Occupational History   Not on file  Tobacco Use   Smoking status: Former    Years: 30.00    Types: Cigarettes    Quit date: 08/12/1998    Years since quitting: 23.9   Smokeless tobacco: Never  Vaping Use   Vaping Use: Never used  Substance and Sexual Activity   Alcohol use: Yes    Comment: occasional   Drug use: No   Sexual activity: Yes    Birth control/protection: None  Other Topics Concern   Not on file  Social History Narrative   Not on file   Social Determinants of Health   Financial Resource Strain: Not on file  Food Insecurity: Not on file  Transportation Needs: Not on file  Physical Activity: Not on file  Stress: Not on file  Social Connections: Not on file   No Known Allergies Family History  Problem Relation Age of Onset   Colon cancer Neg Hx    Colon polyps Neg Hx    Esophageal cancer Neg Hx    Rectal cancer Neg Hx    Stomach cancer Neg Hx      Current Outpatient Medications (Cardiovascular):  amLODipine (NORVASC) 5 MG tablet, TAKE 1 TABLET (5 MG TOTAL) BY MOUTH DAILY.   lisinopril (ZESTRIL) 20 MG tablet, TAKE 1 TABLET BY MOUTH EVERY DAY   Current Outpatient Medications (Analgesics):    acetaminophen (TYLENOL) 325 MG tablet, Take 650 mg by mouth every 6 (six) hours as needed for moderate pain or headache.  Current Outpatient Medications (Hematological):    Iron, Ferrous Sulfate, 325 (65 Fe) MG TABS, Take one twice daily.  Current Outpatient Medications (Other):    Bioflavonoid Products (BIOFLEX PO), Take by mouth.   buPROPion (WELLBUTRIN XL) 150 MG 24 hr tablet, TAKE 1 TABLET BY MOUTH EVERY DAY   cyclobenzaprine (FLEXERIL) 10 MG tablet, Take 10 mg by mouth 3 (three) times daily as needed for muscle spasms.   diclofenac  Sodium (VOLTAREN) 1 % GEL, Apply a small grape sized dollop to right knee 4 times daily as needed for pain.   Flaxseed, Linseed, (FLAX SEEDS PO), Take by mouth.   Ginger, Zingiber officinalis, (GINGER PO), Take by mouth.   magnesium 30 MG tablet, Take 30 mg by mouth daily.   Multiple Vitamin (MULTIVITAMIN WITH MINERALS) TABS tablet, Take 1 tablet by mouth daily.   Omega-3 Fatty Acids (OMEGA 3 PO), Take 1 capsule by mouth daily.   Probiotic CAPS, Take 1 capsule by mouth daily.   VITAMIN D PO, Take 1 capsule by mouth daily.   VITAMIN E PO, Take by mouth.   zinc gluconate 50 MG tablet, Take 50 mg by mouth daily.   Reviewed prior external information including notes and imaging from  primary care provider As well as notes that were available from care everywhere and other healthcare systems.  Past medical history, social, surgical and family history all reviewed in electronic medical record.  No pertanent information unless stated regarding to the chief complaint.   Review of Systems:  No headache, visual changes, nausea, vomiting, diarrhea, constipation, dizziness, abdominal pain, skin rash, fevers, chills, night sweats, weight loss, swollen lymph nodes, body aches, joint swelling, chest pain, shortness of breath, mood changes. POSITIVE muscle aches  Objective  There were no vitals taken for this visit.   General: No apparent distress alert and oriented x3 mood and affect normal, dressed appropriately.  HEENT: Pupils equal, extraocular movements intact  Respiratory: Patient's speak in full sentences and does not appear short of breath  Cardiovascular: No lower extremity edema, non tender, no erythema      Impression and Recommendations:

## 2022-07-23 ENCOUNTER — Ambulatory Visit: Payer: Federal, State, Local not specified - PPO | Admitting: Family Medicine

## 2022-07-23 ENCOUNTER — Encounter: Payer: Self-pay | Admitting: Family Medicine

## 2022-07-23 VITALS — BP 132/84 | HR 75 | Temp 101.5°F | Wt 253.2 lb

## 2022-07-23 DIAGNOSIS — U071 COVID-19: Secondary | ICD-10-CM

## 2022-07-23 MED ORDER — BENZONATATE 100 MG PO CAPS: 100.0000 mg | ORAL_CAPSULE | Freq: Two times a day (BID) | ORAL | 0 refills | Status: DC | PRN

## 2022-07-23 MED ORDER — NIRMATRELVIR/RITONAVIR (PAXLOVID)TABLET: 3.0000 | ORAL_TABLET | Freq: Two times a day (BID) | ORAL | 0 refills | Status: AC

## 2022-07-23 NOTE — Patient Instructions (Addendum)
Take paxlovid to suppress viral symptoms Use tessalon perles as needed for cough For COVID, Quarantine at home for 5 days since the start of illness. After this you, may exit quarantine, but please still wear a mask indoors and outdoors for 5 more days.   Please be sure to drink plenty of fluids.   You may take the following OTC medications to help with symptoms:  For cough, use  cough syrups or other cough suppressants.  For headache, sore throat, fevers, muscle aches, chills, other pain, take ibuprofen or tylenol  For congestion, use nasal sprays, decongestants, or antihistamines  Please follow up if no improvement.   Go to ED if you have severe chest pain, fevers, shortness of breath or other worrisome symptoms.

## 2022-07-23 NOTE — Progress Notes (Signed)
Assessment/Plan:   Problem List Items Addressed This Visit   None Visit Diagnoses     COVID-19    -  Primary   Relevant Medications   nirmatrelvir/ritonavir EUA (PAXLOVID) 20 x 150 MG & 10 x '100MG'$  TABS   benzonatate (TESSALON) 100 MG capsule      High risk for based on age and chronic illness Mild disease at this time Treat with paxlovid Tessalon perles OTC supportive care Quarantine, ED and Return precautions discussed    Subjective:  HPI:  Jonathan Edwards is a 61 y.o. male who has Essential hypertension; Allergic rhinitis; PROSTATITIS, MILD; HERNIA, UMBILICAL; Subacromial bursitis; Plantar fasciitis of right foot; Acute bronchitis; Midline low back pain without sciatica; Right knee pain; Synovitis of right knee; Lateral meniscal tear; Hip flexor tendon tightness, right; Dupuytren contracture; Healthcare maintenance; Erectile dysfunction due to arterial insufficiency; Increased prostate specific antigen (PSA) velocity; Prostate cancer ([place]); Abdominal pain; Elevated glucose; Anemia; Paresthesias; Dysthymia; and Iron deficiency on their problem list..   He  has a past medical history of Cancer ([place]), Cataract, GERD (gastroesophageal reflux disease), Glaucoma, Hypertension, Pneumonia, and Tuberculosis.Marland Kitchen   He presents with chief complaint of Sore Throat (Body aches, sore throat, headache x 1 day. Home covid test negative. ) . 0  COVID19. He complains of achiness, congestion, fever 101F, and sore throat. Onset of symptoms was yesterday and staying constant.He is drinking plenty of fluids.  Patient tested positive for COVID today at clinic. Treatment to date:  alkaseltzer .  Patient is non-smoker. COVID Vaccination status: Original series: Yes, Booster: Yes 2022  ROS: The patient denies chest pain, dyspnea, wheezing or hemoptysis.   Past Surgical History:  Procedure Laterality Date   COLONOSCOPY  2011   EYE SURGERY     hole repair left eye and detached retina surgery   HERNIA  REPAIR     umbilical with mesh   LAPAROSCOPIC LYSIS OF ADHESIONS N/A 03/06/2020   Procedure: LAPAROSCOPIC LYSIS OF ADHESIONS;  Surgeon: Lucas Mallow, MD;  Location: [facility];  Service: Urology;  Laterality: N/A;   PELVIC LYMPH NODE DISSECTION Bilateral 03/06/2020   Procedure: BILATERAL PELVIC LYMPH NODE DISSECTION;  Surgeon: Lucas Mallow, MD;  Location: [facility];  Service: Urology;  Laterality: Bilateral;   ROBOT ASSISTED LAPAROSCOPIC RADICAL PROSTATECTOMY N/A 03/06/2020   Procedure: XI ROBOTIC ASSISTED LAPAROSCOPIC RADICAL PROSTATECTOMY;  Surgeon: Lucas Mallow, MD;  Location: [facility];  Service: Urology;  Laterality: N/A;   TONSILLECTOMY      Outpatient Medications Prior to Visit  Medication Sig Dispense Refill   acetaminophen (TYLENOL) 325 MG tablet Take 650 mg by mouth every 6 (six) hours as needed for moderate pain or headache.     amLODipine (NORVASC) 5 MG tablet TAKE 1 TABLET (5 MG TOTAL) BY MOUTH DAILY. 90 tablet 1   Bioflavonoid Products (BIOFLEX PO) Take by mouth.     buPROPion (WELLBUTRIN XL) 150 MG 24 hr tablet TAKE 1 TABLET BY MOUTH EVERY DAY 90 tablet 1   cyclobenzaprine (FLEXERIL) 10 MG tablet Take 10 mg by mouth 3 (three) times daily as needed for muscle spasms.     diclofenac Sodium (VOLTAREN) 1 % GEL Apply a small grape sized dollop to right knee 4 times daily as needed for pain. 150 g 2   Ginger, Zingiber officinalis, (GINGER PO) Take by mouth.     lisinopril (ZESTRIL) 20 MG tablet TAKE 1 TABLET BY MOUTH EVERY DAY 90 tablet 3   magnesium 30  MG tablet Take 30 mg by mouth daily.     Multiple Vitamin (MULTIVITAMIN WITH MINERALS) TABS tablet Take 1 tablet by mouth daily.     Omega-3 Fatty Acids (OMEGA 3 PO) Take 1 capsule by mouth daily.     VITAMIN D PO Take 1 capsule by mouth daily.     VITAMIN E PO Take by mouth.     zinc gluconate 50 MG tablet Take 50 mg by mouth daily.     Flaxseed, Linseed, (FLAX SEEDS PO) Take by mouth. (Patient not taking: Reported on  07/23/2022)     Iron, Ferrous Sulfate, 325 (65 Fe) MG TABS Take one twice daily. (Patient not taking: Reported on 07/23/2022) 180 tablet 1   Probiotic CAPS Take 1 capsule by mouth daily. (Patient not taking: Reported on 07/23/2022)     No facility-administered medications prior to visit.    Family History  Problem Relation Age of Onset   Colon cancer Neg Hx    Colon polyps Neg Hx    Esophageal cancer Neg Hx    Rectal cancer Neg Hx    Stomach cancer Neg Hx     Social History   Socioeconomic History   Marital status: Married    Spouse name: Not on file   Number of children: Not on file   Years of education: Not on file   Highest education level: Not on file  Occupational History   Not on file  Tobacco Use   Smoking status: Former    Years: 30.00    Types: Cigarettes    Quit date: 08/12/1998    Years since quitting: 23.9   Smokeless tobacco: Never  Vaping Use   Vaping Use: Never used  Substance and Sexual Activity   Alcohol use: Yes    Comment: occasional   Drug use: No   Sexual activity: Yes    Birth control/protection: None  Other Topics Concern   Not on file  Social History Narrative   Not on file   Social Determinants of Health   Financial Resource Strain: Not on file  Food Insecurity: Not on file  Transportation Needs: Not on file  Physical Activity: Not on file  Stress: Not on file  Social Connections: Not on file  Intimate Partner Violence: Not on file                                                                                                 Objective:  Physical Exam: BP 132/84 (BP Location: Right Arm, Patient Position: Sitting, Cuff Size: Large)   Pulse 75   Temp (!) 101.5 F (38.6 C) (Oral)   Wt 253 lb 3.2 oz (114.9 kg)   SpO2 99%   BMI 32.51 kg/m    General: No acute distress. Awake and conversant.  Eyes: Normal conjunctiva, anicteric. Round symmetric pupils.  ENT: Hearing grossly intact. No nasal discharge.  Neck: Neck is supple. No  masses or thyromegaly.  Respiratory: Respirations are non-labored. CTAB Skin: Warm. No rashes or ulcers.  Psych: Alert and oriented. Cooperative, Appropriate mood and affect, Normal judgment.  CV: No cyanosis  or JVD, RRR, no MRG MSK: Normal ambulation. No clubbing  Neuro: Sensation and CN II-XII grossly normal.   Tested pos for covid     Alesia Banda, MD, MS

## 2022-07-24 ENCOUNTER — Ambulatory Visit: Payer: Federal, State, Local not specified - PPO | Admitting: Family Medicine

## 2022-08-13 NOTE — Progress Notes (Signed)
[name] [place] Phone: [phone] Subjective:   Jonathan Edwards, am serving as a scribe for Dr. Hulan Saas.  I'm seeing this patient by the request  of:  Libby Maw, MD  CC: right knee pain   [id number]  06/05/2022 Patient has had difficulty with this previously. Has had multiple injections, failed greater than 6 months of conservative therapy including injections, oral anti-inflammatories, icing regimen, topical anti-inflammatories home and formal physical therapy. With increasing instability I do feel advanced imaging is warranted to further evaluate if any surgical intervention would be beneficial or other types of injections. Patient is in agreement with the plan and will follow-up with me again after imaging to discuss further.   Update 08/16/2022 Jonathan Edwards is a 62 y.o. male coming in with complaint of R knee pain. Patient states that he wanted his right knee checked to see if there needs to be some fluid removed. The pain is not as bad as it was, can tell that different surfaces he can feel pain. Would like to see if he can get a handicap placard. Patient states he has noticed a pocket of fluid on the lateral top patella. Does have trouble bending the right leg all the way if he tries it gets really tight. -     Past Medical History:  Diagnosis Date   Cancer [facility])    prostate  2021   Cataract    GERD (gastroesophageal reflux disease)    Glaucoma    Hypertension    Pneumonia    Tuberculosis    + ppd test  when 62 years old never had any symptoms   Past Surgical History:  Procedure Laterality Date   COLONOSCOPY  2011   EYE SURGERY     hole repair left eye and detached retina surgery   HERNIA REPAIR     umbilical with mesh   LAPAROSCOPIC LYSIS OF ADHESIONS N/A 03/06/2020   Procedure: LAPAROSCOPIC LYSIS OF ADHESIONS;  Surgeon: Lucas Mallow, MD;  Location: [facility];  Service:  Urology;  Laterality: N/A;   PELVIC LYMPH NODE DISSECTION Bilateral 03/06/2020   Procedure: BILATERAL PELVIC LYMPH NODE DISSECTION;  Surgeon: Lucas Mallow, MD;  Location: [facility];  Service: Urology;  Laterality: Bilateral;   ROBOT ASSISTED LAPAROSCOPIC RADICAL PROSTATECTOMY N/A 03/06/2020   Procedure: XI ROBOTIC ASSISTED LAPAROSCOPIC RADICAL PROSTATECTOMY;  Surgeon: Lucas Mallow, MD;  Location: [facility];  Service: Urology;  Laterality: N/A;   TONSILLECTOMY     Social History   Socioeconomic History   Marital status: Married    Spouse name: Not on file   Number of children: Not on file   Years of education: Not on file   Highest education level: Not on file  Occupational History   Not on file  Tobacco Use   Smoking status: Former    Years: 30.00    Types: Cigarettes    Quit date: 08/12/1998    Years since quitting: 24.0   Smokeless tobacco: Never  Vaping Use   Vaping Use: Never used  Substance and Sexual Activity   Alcohol use: Yes    Comment: occasional   Drug use: No   Sexual activity: Yes    Birth control/protection: None  Other Topics Concern   Not on file  Social History Narrative   Not on file   Social Determinants of Health   Financial Resource Strain: Not on file  Food  Insecurity: Not on file  Transportation Needs: Not on file  Physical Activity: Not on file  Stress: Not on file  Social Connections: Not on file   No Known Allergies Family History  Problem Relation Age of Onset   Colon cancer Neg Hx    Colon polyps Neg Hx    Esophageal cancer Neg Hx    Rectal cancer Neg Hx    Stomach cancer Neg Hx      Current Outpatient Medications (Cardiovascular):    amLODipine (NORVASC) 5 MG tablet, TAKE 1 TABLET (5 MG TOTAL) BY MOUTH DAILY.   lisinopril (ZESTRIL) 20 MG tablet, TAKE 1 TABLET BY MOUTH EVERY DAY  Current Outpatient Medications (Respiratory):    benzonatate (TESSALON) 100 MG capsule, Take 1 capsule (100 mg total) by mouth 2 (two) times daily  as needed for cough.  Current Outpatient Medications (Analgesics):    acetaminophen (TYLENOL) 325 MG tablet, Take 650 mg by mouth every 6 (six) hours as needed for moderate pain or headache.  Current Outpatient Medications (Hematological):    Iron, Ferrous Sulfate, 325 (65 Fe) MG TABS, Take one twice daily.  Current Outpatient Medications (Other):    Bioflavonoid Products (BIOFLEX PO), Take by mouth.   buPROPion (WELLBUTRIN XL) 150 MG 24 hr tablet, TAKE 1 TABLET BY MOUTH EVERY DAY   cyclobenzaprine (FLEXERIL) 10 MG tablet, Take 10 mg by mouth 3 (three) times daily as needed for muscle spasms.   diclofenac Sodium (VOLTAREN) 1 % GEL, Apply a small grape sized dollop to right knee 4 times daily as needed for pain.   Ginger, Zingiber officinalis, (GINGER PO), Take by mouth.   magnesium 30 MG tablet, Take 30 mg by mouth daily.   Multiple Vitamin (MULTIVITAMIN WITH MINERALS) TABS tablet, Take 1 tablet by mouth daily.   Omega-3 Fatty Acids (OMEGA 3 PO), Take 1 capsule by mouth daily.   VITAMIN D PO, Take 1 capsule by mouth daily.   VITAMIN E PO, Take by mouth.   zinc gluconate 50 MG tablet, Take 50 mg by mouth daily.   Flaxseed, Linseed, (FLAX SEEDS PO), Take by mouth. (Patient not taking: Reported on 07/23/2022)   Probiotic CAPS, Take 1 capsule by mouth daily. (Patient not taking: Reported on 07/23/2022)   Reviewed prior external information including notes and imaging from  primary care provider As well as notes that were available from care everywhere and other healthcare systems.  Past medical history, social, surgical and family history all reviewed in electronic medical record.  No pertanent information unless stated regarding to the chief complaint.   Review of Systems:  No headache, visual changes, nausea, vomiting, diarrhea, constipation, dizziness, abdominal pain, skin rash, fevers, chills, night sweats, weight loss, swollen lymph nodes, body aches, joint swelling, chest pain,  shortness of breath, mood changes. POSITIVE muscle aches  Objective  Blood pressure 138/88, pulse 89, height '6\' 2"'$  (1.88 m), weight 245 lb (111.1 kg), SpO2 98 %.   General: No apparent distress alert and oriented x3 mood and affect normal, dressed appropriately.  HEENT: Pupils equal, extraocular movements intact  Respiratory: Patient's speak in full sentences and does not appear short of breath  Cardiovascular: No lower extremity edema, non tender, no erythema  Antalgic gait noted.  Some instability noted with valgus and varus force. Patient does have noted to palpation mostly over the lateral compartment with crepitus.  After informed written and verbal consent, patient was seated on exam table. Left knee was prepped with alcohol swab and utilizing anterolateral  approach, patient's left knee space was injected with 4:1  marcaine 0.5%: Kenalog '40mg'$ /dL. Patient tolerated the procedure well without immediate complications.   Impression and Recommendations:    The above documentation has been reviewed and is accurate and complete Lyndal Pulley, DO

## 2022-08-16 ENCOUNTER — Encounter: Payer: Self-pay | Admitting: Family Medicine

## 2022-08-16 ENCOUNTER — Ambulatory Visit (INDEPENDENT_AMBULATORY_CARE_PROVIDER_SITE_OTHER): Payer: Federal, State, Local not specified - PPO | Admitting: Family Medicine

## 2022-08-16 VITALS — BP 138/88 | HR 89 | Ht 74.0 in | Wt 245.0 lb

## 2022-08-16 DIAGNOSIS — M25561 Pain in right knee: Secondary | ICD-10-CM | POA: Diagnosis not present

## 2022-08-16 DIAGNOSIS — M1711 Unilateral primary osteoarthritis, right knee: Secondary | ICD-10-CM | POA: Diagnosis not present

## 2022-08-16 NOTE — Assessment & Plan Note (Signed)
Injection given today.  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  Discussed with patient about icing regimen and home exercises, discussed which activities to do and which ones to avoid.  Patient wants no surgical intervention at this point.  Patient is having difficulty with even walking 200 feet.  With continued instability also of the knee.  Patient given a handicap form for his car.  Patient will follow-up with me again in 10 weeks for further evaluation and treatment.

## 2022-08-16 NOTE — Patient Instructions (Signed)
Happy New Year!  See you in 10 weeks

## 2022-08-19 ENCOUNTER — Other Ambulatory Visit: Payer: Self-pay | Admitting: Family Medicine

## 2022-08-19 DIAGNOSIS — F341 Dysthymic disorder: Secondary | ICD-10-CM

## 2022-08-27 ENCOUNTER — Telehealth: Payer: Self-pay | Admitting: Family Medicine

## 2022-08-27 NOTE — Telephone Encounter (Signed)
Returned patients call regarding questions about scratchy throat, little sinus issues and runny nose. Advised patient to try Mucinex to see if this helps to dry up this sinus issue and honey to help help with his throat if no improvement within the next few days to give [place] a call for an appointment for evaluation.

## 2022-08-27 NOTE — Telephone Encounter (Signed)
Scratchy throat,runny nose,sinus, . Pt want to know what to do . Pt didn't want to make apt with out dr concerns. Pt want to know if the dr suggest of any medications

## 2022-08-29 ENCOUNTER — Ambulatory Visit: Payer: Federal, State, Local not specified - PPO | Admitting: Family Medicine

## 2022-08-29 ENCOUNTER — Encounter: Payer: Self-pay | Admitting: Family Medicine

## 2022-08-29 VITALS — BP 132/80 | HR 82 | Temp 97.2°F | Ht 74.0 in | Wt 245.8 lb

## 2022-08-29 DIAGNOSIS — K439 Ventral hernia without obstruction or gangrene: Secondary | ICD-10-CM | POA: Diagnosis not present

## 2022-08-29 DIAGNOSIS — J018 Other acute sinusitis: Secondary | ICD-10-CM

## 2022-08-29 MED ORDER — AMOXICILLIN 875 MG PO TABS: 875.0000 mg | ORAL_TABLET | Freq: Two times a day (BID) | ORAL | 0 refills | Status: AC

## 2022-08-29 NOTE — Progress Notes (Signed)
Established Patient Office Visit   Subjective:  Patient ID: Jonathan Edwards, male    DOB: 12/22/1960  Age: 62 y.o. MRN: [id number]  Chief Complaint  Patient presents with   Sinusitis    Sinus pressure, fatigue, runny nose symptoms x 1 week     Sinusitis Associated symptoms include congestion. Pertinent negatives include no ear pain, headaches, shortness of breath or sore throat.   Encounter Diagnoses  Name Primary?   Ventral hernia without obstruction or gangrene Yes   Other subacute sinusitis    1 week history of nasal congestion, postnasal drip, lightheadedness.  After starting Mucinex there is copious purulent rhinorrhea.  He denies facial pressure, teeth pain, fevers chills, difficulty breathing or wheezing.  He has felt some pressure pain behind his eyes.  History of ventral hernia.  There has been some discomfort associated with it.  There is been no nausea or vomiting.   Review of Systems  Constitutional: Negative.   HENT:  Positive for congestion. Negative for ear pain, hearing loss, sinus pain and sore throat.   Eyes:  Negative for blurred vision, discharge and redness.  Respiratory: Negative.  Negative for shortness of breath and wheezing.   Cardiovascular: Negative.   Gastrointestinal:  Negative for abdominal pain, nausea and vomiting.  Genitourinary: Negative.   Musculoskeletal: Negative.  Negative for myalgias.  Skin:  Negative for rash.  Neurological:  Negative for tingling, loss of consciousness, weakness and headaches.  Endo/Heme/Allergies:  Negative for polydipsia.     Current Outpatient Medications:    acetaminophen (TYLENOL) 325 MG tablet, Take 650 mg by mouth every 6 (six) hours as needed for moderate pain or headache., Disp: , Rfl:    amLODipine (NORVASC) 5 MG tablet, TAKE 1 TABLET (5 MG TOTAL) BY MOUTH DAILY., Disp: 90 tablet, Rfl: 1   amoxicillin (AMOXIL) 875 MG tablet, Take 1 tablet (875 mg total) by mouth 2 (two) times daily for 10 days., Disp: 20  tablet, Rfl: 0   Bioflavonoid Products (BIOFLEX PO), Take by mouth., Disp: , Rfl:    buPROPion (WELLBUTRIN XL) 150 MG 24 hr tablet, TAKE 1 TABLET BY MOUTH EVERY DAY, Disp: 90 tablet, Rfl: 1   cyclobenzaprine (FLEXERIL) 10 MG tablet, Take 10 mg by mouth 3 (three) times daily as needed for muscle spasms., Disp: , Rfl:    diclofenac Sodium (VOLTAREN) 1 % GEL, Apply a small grape sized dollop to right knee 4 times daily as needed for pain., Disp: 150 g, Rfl: 2   Ginger, Zingiber officinalis, (GINGER PO), Take by mouth., Disp: , Rfl:    lisinopril (ZESTRIL) 20 MG tablet, TAKE 1 TABLET BY MOUTH EVERY DAY, Disp: 90 tablet, Rfl: 3   magnesium 30 MG tablet, Take 30 mg by mouth daily., Disp: , Rfl:    Multiple Vitamin (MULTIVITAMIN WITH MINERALS) TABS tablet, Take 1 tablet by mouth daily., Disp: , Rfl:    Omega-3 Fatty Acids (OMEGA 3 PO), Take 1 capsule by mouth daily., Disp: , Rfl:    VITAMIN D PO, Take 1 capsule by mouth daily., Disp: , Rfl:    VITAMIN E PO, Take by mouth., Disp: , Rfl:    zinc gluconate 50 MG tablet, Take 50 mg by mouth daily., Disp: , Rfl:    Flaxseed, Linseed, (FLAX SEEDS PO), Take by mouth. (Patient not taking: Reported on 07/23/2022), Disp: , Rfl:    Iron, Ferrous Sulfate, 325 (65 Fe) MG TABS, Take one twice daily. (Patient not taking: Reported on 08/29/2022), Disp: 180 tablet,  Rfl: 1   Probiotic CAPS, Take 1 capsule by mouth daily. (Patient not taking: Reported on 07/23/2022), Disp: , Rfl:    Objective:     BP 132/80 (BP Location: Left Arm, Patient Position: Sitting, Cuff Size: Large)   Pulse 82   Temp (!) 97.2 F (36.2 C) (Temporal)   Ht '6\' 2"'$  (1.88 m)   Wt 245 lb 12.8 oz (111.5 kg)   SpO2 96%   BMI 31.56 kg/m    Physical Exam Constitutional:      General: He is not in acute distress.    Appearance: Normal appearance. He is not ill-appearing, toxic-appearing or diaphoretic.  HENT:     Head: Normocephalic and atraumatic.     Right Ear: External ear normal.     Left  Ear: External ear normal.     Mouth/Throat:     Mouth: Mucous membranes are moist.     Pharynx: Oropharynx is clear. No oropharyngeal exudate or posterior oropharyngeal erythema.  Eyes:     General: No scleral icterus.       Right eye: No discharge.        Left eye: No discharge.     Extraocular Movements: Extraocular movements intact.     Conjunctiva/sclera: Conjunctivae normal.     Pupils: Pupils are equal, round, and reactive to light.  Cardiovascular:     Rate and Rhythm: Normal rate and regular rhythm.  Pulmonary:     Effort: Pulmonary effort is normal. No respiratory distress.     Breath sounds: Normal breath sounds.  Abdominal:     General: Bowel sounds are normal.     Hernia: A hernia is present. Hernia is present in the umbilical area.  Musculoskeletal:     Cervical back: No rigidity or tenderness.  Skin:    General: Skin is warm and dry.  Neurological:     Mental Status: He is alert and oriented to person, place, and time.  Psychiatric:        Mood and Affect: Mood normal.        Behavior: Behavior normal.      No results found for any visits on 08/29/22.    The 10-year ASCVD risk score (Arnett DK, et al., 2019) is: 16.1%    Assessment & Plan:   Ventral hernia without obstruction or gangrene  Other subacute sinusitis -     Amoxicillin; Take 1 tablet (875 mg total) by mouth 2 (two) times daily for 10 days.  Dispense: 20 tablet; Refill: 0    Return if symptoms worsen or fail to improve.  Will return with son is still clear with Amoxil.  Will let me know if they hernia becomes worse.  Surgical referral if needed.  Libby Maw, MD

## 2022-09-10 DIAGNOSIS — H26491 Other secondary cataract, right eye: Secondary | ICD-10-CM | POA: Diagnosis not present

## 2022-09-10 DIAGNOSIS — H18413 Arcus senilis, bilateral: Secondary | ICD-10-CM | POA: Diagnosis not present

## 2022-09-10 DIAGNOSIS — H35373 Puckering of macula, bilateral: Secondary | ICD-10-CM | POA: Diagnosis not present

## 2022-09-10 DIAGNOSIS — H26493 Other secondary cataract, bilateral: Secondary | ICD-10-CM | POA: Diagnosis not present

## 2022-09-10 DIAGNOSIS — Z961 Presence of intraocular lens: Secondary | ICD-10-CM | POA: Diagnosis not present

## 2022-09-17 DIAGNOSIS — H26492 Other secondary cataract, left eye: Secondary | ICD-10-CM | POA: Diagnosis not present

## 2022-11-14 NOTE — Progress Notes (Signed)
Tawana ScaleZach Asiana Benninger D.O. [facility] [address] [place] [id number] Phone: [phone] Subjective:   Nadine Counts, Morgen Wade, am serving as a scribe for Dr. Antoine PrimasZachary Shyann Hefner.  I'm seeing this patient by the request  of:  Mliss SaxKremer, William Alfred, MD  CC:   BJY:[id number]:Subjective  08/16/2022 Injection given today.  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  Discussed with patient about icing regimen and home exercises, discussed which activities to do and which ones to avoid.  Patient wants no surgical intervention at this point.  Patient is having difficulty with even walking 200 feet.  With continued instability also of the knee.  Patient given a handicap form for his car.  Patient will follow-up with me again in 10 weeks for further evaluation and treatment.      Update 11/15/2022 Seanpaul Tia AlertLinney is a 62 y.o. male coming in with complaint of R knee pain. Patient states doing better since last visit. Thinks injections are helping. Having some problems in his right hip. Wants to know if it's all connected.      Past Medical History:  Diagnosis Date   Cancer [facility](HCC)    prostate  2021   Cataract    GERD (gastroesophageal reflux disease)    Glaucoma    Hypertension    Pneumonia    Tuberculosis    + ppd test  when 62 years old never had any symptoms   Past Surgical History:  Procedure Laterality Date   COLONOSCOPY  2011   EYE SURGERY     hole repair left eye and detached retina surgery   HERNIA REPAIR     umbilical with mesh   LAPAROSCOPIC LYSIS OF ADHESIONS N/A 03/06/2020   Procedure: LAPAROSCOPIC LYSIS OF ADHESIONS;  Surgeon: Crista ElliotBell, Eugene D III, MD;  Location: [facility];  Service: Urology;  Laterality: N/A;   PELVIC LYMPH NODE DISSECTION Bilateral 03/06/2020   Procedure: BILATERAL PELVIC LYMPH NODE DISSECTION;  Surgeon: Crista ElliotBell, Eugene D III, MD;  Location: [facility];  Service: Urology;  Laterality: Bilateral;   ROBOT ASSISTED LAPAROSCOPIC RADICAL PROSTATECTOMY N/A  03/06/2020   Procedure: XI ROBOTIC ASSISTED LAPAROSCOPIC RADICAL PROSTATECTOMY;  Surgeon: Crista ElliotBell, Eugene D III, MD;  Location: [facility];  Service: Urology;  Laterality: N/A;   TONSILLECTOMY     Social History   Socioeconomic History   Marital status: Married    Spouse name: Not on file   Number of children: Not on file   Years of education: Not on file   Highest education level: Not on file  Occupational History   Not on file  Tobacco Use   Smoking status: Former    Years: 30    Types: Cigarettes    Quit date: 08/12/1998    Years since quitting: 24.2   Smokeless tobacco: Never  Vaping Use   Vaping Use: Never used  Substance and Sexual Activity   Alcohol use: Yes    Comment: occasional   Drug use: No   Sexual activity: Yes    Birth control/protection: None  Other Topics Concern   Not on file  Social History Narrative   Not on file   Social Determinants of Health   Financial Resource Strain: Not on file  Food Insecurity: Not on file  Transportation Needs: Not on file  Physical Activity: Not on file  Stress: Not on file  Social Connections: Not on file   No Known Allergies Family History  Problem Relation Age of Onset   Colon cancer  Neg Hx    Colon polyps Neg Hx    Esophageal cancer Neg Hx    Rectal cancer Neg Hx    Stomach cancer Neg Hx      Current Outpatient Medications (Cardiovascular):    amLODipine (NORVASC) 5 MG tablet, TAKE 1 TABLET (5 MG TOTAL) BY MOUTH DAILY.   lisinopril (ZESTRIL) 20 MG tablet, TAKE 1 TABLET BY MOUTH EVERY DAY   Current Outpatient Medications (Analgesics):    acetaminophen (TYLENOL) 325 MG tablet, Take 650 mg by mouth every 6 (six) hours as needed for moderate pain or headache.  Current Outpatient Medications (Hematological):    Iron, Ferrous Sulfate, 325 (65 Fe) MG TABS, Take one twice daily. (Patient not taking: Reported on 08/29/2022)  Current Outpatient Medications (Other):    Bioflavonoid Products (BIOFLEX PO), Take by mouth.    buPROPion (WELLBUTRIN XL) 150 MG 24 hr tablet, TAKE 1 TABLET BY MOUTH EVERY DAY   cyclobenzaprine (FLEXERIL) 10 MG tablet, Take 10 mg by mouth 3 (three) times daily as needed for muscle spasms.   diclofenac Sodium (VOLTAREN) 1 % GEL, Apply a small grape sized dollop to right knee 4 times daily as needed for pain.   Flaxseed, Linseed, (FLAX SEEDS PO), Take by mouth. (Patient not taking: Reported on 07/23/2022)   Ginger, Zingiber officinalis, (GINGER PO), Take by mouth.   magnesium 30 MG tablet, Take 30 mg by mouth daily.   Multiple Vitamin (MULTIVITAMIN WITH MINERALS) TABS tablet, Take 1 tablet by mouth daily.   Omega-3 Fatty Acids (OMEGA 3 PO), Take 1 capsule by mouth daily.   Probiotic CAPS, Take 1 capsule by mouth daily. (Patient not taking: Reported on 07/23/2022)   VITAMIN D PO, Take 1 capsule by mouth daily.   VITAMIN E PO, Take by mouth.   zinc gluconate 50 MG tablet, Take 50 mg by mouth daily.   Reviewed prior external information including notes and imaging from  primary care provider As well as notes that were available from care everywhere and other healthcare systems.  Past medical history, social, surgical and family history all reviewed in electronic medical record.  No pertanent information unless stated regarding to the chief complaint.   Review of Systems:  No headache, visual changes, nausea, vomiting, diarrhea, constipation, dizziness, abdominal pain, skin rash, fevers, chills, night sweats, weight loss, swollen lymph nodes, body aches, joint swelling, chest pain, shortness of breath, mood changes. POSITIVE muscle aches  Objective  Blood pressure (!) 128/90, pulse 90, height  (1.88 m), weight 252 lb (114.3 kg), SpO2 91 %.   General: No apparent distress alert and oriented x3 mood and affect normal, dressed appropriately.  HEENT: Pupils equal, extraocular movements intact  Respiratory: Patient's speak in full sentences and does not appear short of breath   Cardiovascular: No lower extremity edema, non tender, no erythema  Mild antalgic gait noted.  Patient's right knee still has trace effusion noted.  Some tenderness to palpation over the lateral joint line.  Some mild crepitus.  No instability with valgus and varus force. Right hip is fairly unremarkable though.  5 out of 5 strength    Impression and Recommendations:    The above documentation has been reviewed and is accurate and complete Judi Saa, DO

## 2022-11-15 ENCOUNTER — Ambulatory Visit (INDEPENDENT_AMBULATORY_CARE_PROVIDER_SITE_OTHER): Payer: Federal, State, Local not specified - PPO

## 2022-11-15 ENCOUNTER — Ambulatory Visit: Payer: Federal, State, Local not specified - PPO | Admitting: Family Medicine

## 2022-11-15 VITALS — BP 128/90 | HR 90 | Ht 74.0 in | Wt 252.0 lb

## 2022-11-15 DIAGNOSIS — M1711 Unilateral primary osteoarthritis, right knee: Secondary | ICD-10-CM | POA: Diagnosis not present

## 2022-11-15 DIAGNOSIS — M25551 Pain in right hip: Secondary | ICD-10-CM | POA: Diagnosis not present

## 2022-11-15 NOTE — Patient Instructions (Signed)
Hold in injection for now Keep being active Xray today See you again in 2-3 months just in case

## 2022-11-15 NOTE — Assessment & Plan Note (Addendum)
Patient does have severe lateral compartment arthritis.  Patient still wants to avoid any surgical intervention.  Wants to continue with the conservative therapy.  Feels like he can hold on any type of injection today.  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  Discussed icing regimen and home exercises.  Follow-up again in 8-12 weeks.

## 2022-12-24 ENCOUNTER — Other Ambulatory Visit: Payer: Self-pay | Admitting: Family Medicine

## 2022-12-24 DIAGNOSIS — I10 Essential (primary) hypertension: Secondary | ICD-10-CM

## 2023-01-24 ENCOUNTER — Telehealth: Payer: Self-pay | Admitting: Family Medicine

## 2023-01-24 NOTE — Telephone Encounter (Signed)
Pt handicapped parking will expire in June. Can we complete another form for 6 more months?  Pt will p/u when ready.

## 2023-01-24 NOTE — Telephone Encounter (Signed)
Form completed. Patient notified.

## 2023-01-24 NOTE — Telephone Encounter (Signed)
Left VM for pt, form ready for pickup.

## 2023-02-19 ENCOUNTER — Ambulatory Visit: Payer: Federal, State, Local not specified - PPO | Admitting: Family Medicine

## 2023-03-03 ENCOUNTER — Other Ambulatory Visit: Payer: Self-pay | Admitting: Family Medicine

## 2023-03-03 DIAGNOSIS — F341 Dysthymic disorder: Secondary | ICD-10-CM

## 2023-03-06 NOTE — Progress Notes (Deleted)
Jonathan Edwards [address] [place] [address] Phone: [phone] Subjective:    I'm seeing this patient by the request  of:  Mliss Sax, MD  CC:   [id number]  11/15/2022 Patient does have severe lateral compartment arthritis.  Patient still wants to avoid any surgical intervention.  Wants to continue with the conservative therapy.  Feels like he can hold on any type of injection today.  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  Discussed icing regimen and home exercises.  Follow-up again in 8-12 weeks.      Update 03/07/2023 Jonathan Edwards is a 62 y.o. male coming in with complaint of R knee and R hip pain. Patient states       Past Medical History:  Diagnosis Date   Cancer [facility])    prostate  2021   Cataract    GERD (gastroesophageal reflux disease)    Glaucoma    Hypertension    Pneumonia    Tuberculosis    + ppd test  when 62 years old never had any symptoms   Past Surgical History:  Procedure Laterality Date   COLONOSCOPY  2011   EYE SURGERY     hole repair left eye and detached retina surgery   HERNIA REPAIR     umbilical with mesh   LAPAROSCOPIC LYSIS OF ADHESIONS N/A 03/06/2020   Procedure: LAPAROSCOPIC LYSIS OF ADHESIONS;  Surgeon: Crista Elliot, MD;  Location: [facility];  Service: Urology;  Laterality: N/A;   PELVIC LYMPH NODE DISSECTION Bilateral 03/06/2020   Procedure: BILATERAL PELVIC LYMPH NODE DISSECTION;  Surgeon: Crista Elliot, MD;  Location: [facility];  Service: Urology;  Laterality: Bilateral;   ROBOT ASSISTED LAPAROSCOPIC RADICAL PROSTATECTOMY N/A 03/06/2020   Procedure: XI ROBOTIC ASSISTED LAPAROSCOPIC RADICAL PROSTATECTOMY;  Surgeon: Crista Elliot, MD;  Location: [facility];  Service: Urology;  Laterality: N/A;   TONSILLECTOMY     Social History   Socioeconomic History   Marital status: Married    Spouse name: Not on file   Number of children: Not on file   Years of education: Not on  file   Highest education level: Not on file  Occupational History   Not on file  Tobacco Use   Smoking status: Former    Current packs/day: 0.00    Types: Cigarettes    Start date: 08/12/1968    Quit date: 08/12/1998    Years since quitting: 24.5   Smokeless tobacco: Never  Vaping Use   Vaping status: Never Used  Substance and Sexual Activity   Alcohol use: Yes    Comment: occasional   Drug use: No   Sexual activity: Yes    Birth control/protection: None  Other Topics Concern   Not on file  Social History Narrative   Not on file   Social Determinants of Health   Financial Resource Strain: Not on file  Food Insecurity: Not on file  Transportation Needs: Not on file  Physical Activity: Not on file  Stress: Not on file  Social Connections: Not on file   No Known Allergies Family History  Problem Relation Age of Onset   Colon cancer Neg Hx    Colon polyps Neg Hx    Esophageal cancer Neg Hx    Rectal cancer Neg Hx    Stomach cancer Neg Hx      Current Outpatient Medications (Cardiovascular):    amLODipine (NORVASC) 5 MG tablet, TAKE 1 TABLET (5 MG TOTAL)  BY MOUTH DAILY.   lisinopril (ZESTRIL) 20 MG tablet, TAKE 1 TABLET BY MOUTH EVERY DAY   Current Outpatient Medications (Analgesics):    acetaminophen (TYLENOL) 325 MG tablet, Take 650 mg by mouth every 6 (six) hours as needed for moderate pain or headache.  Current Outpatient Medications (Hematological):    Iron, Ferrous Sulfate, 325 (65 Fe) MG TABS, Take one twice daily. (Patient not taking: Reported on 08/29/2022)  Current Outpatient Medications (Other):    Bioflavonoid Products (BIOFLEX PO), Take by mouth.   buPROPion (WELLBUTRIN XL) 150 MG 24 hr tablet, TAKE 1 TABLET BY MOUTH EVERY DAY   cyclobenzaprine (FLEXERIL) 10 MG tablet, Take 10 mg by mouth 3 (three) times daily as needed for muscle spasms.   diclofenac Sodium (VOLTAREN) 1 % GEL, Apply a small grape sized dollop to right knee 4 times daily as needed for  pain.   Flaxseed, Linseed, (FLAX SEEDS PO), Take by mouth. (Patient not taking: Reported on 07/23/2022)   Ginger, Zingiber officinalis, (GINGER PO), Take by mouth.   magnesium 30 MG tablet, Take 30 mg by mouth daily.   Multiple Vitamin (MULTIVITAMIN WITH MINERALS) TABS tablet, Take 1 tablet by mouth daily.   Omega-3 Fatty Acids (OMEGA 3 PO), Take 1 capsule by mouth daily.   Probiotic CAPS, Take 1 capsule by mouth daily. (Patient not taking: Reported on 07/23/2022)   VITAMIN D PO, Take 1 capsule by mouth daily.   VITAMIN E PO, Take by mouth.   zinc gluconate 50 MG tablet, Take 50 mg by mouth daily.   Reviewed prior external information including notes and imaging from  primary care provider As well as notes that were available from care everywhere and other healthcare systems.  Past medical history, social, surgical and family history all reviewed in electronic medical record.  No pertanent information unless stated regarding to the chief complaint.   Review of Systems:  No headache, visual changes, nausea, vomiting, diarrhea, constipation, dizziness, abdominal pain, skin rash, fevers, chills, night sweats, weight loss, swollen lymph nodes, body aches, joint swelling, chest pain, shortness of breath, mood changes. POSITIVE muscle aches  Objective  There were no vitals taken for this visit.   General: No apparent distress alert and oriented x3 mood and affect normal, dressed appropriately.  HEENT: Pupils equal, extraocular movements intact  Respiratory: Patient's speak in full sentences and does not appear short of breath  Cardiovascular: No lower extremity edema, non tender, no erythema      Impression and Recommendations:

## 2023-03-07 ENCOUNTER — Ambulatory Visit: Payer: Federal, State, Local not specified - PPO | Admitting: Family Medicine

## 2023-03-12 ENCOUNTER — Encounter (INDEPENDENT_AMBULATORY_CARE_PROVIDER_SITE_OTHER): Payer: Self-pay

## 2023-03-25 ENCOUNTER — Ambulatory Visit (INDEPENDENT_AMBULATORY_CARE_PROVIDER_SITE_OTHER): Payer: Federal, State, Local not specified - PPO | Admitting: Family Medicine

## 2023-03-25 ENCOUNTER — Ambulatory Visit: Payer: Federal, State, Local not specified - PPO | Admitting: Family Medicine

## 2023-03-25 ENCOUNTER — Encounter: Payer: Self-pay | Admitting: Family Medicine

## 2023-03-25 VITALS — BP 130/84 | HR 79 | Temp 97.3°F | Ht 74.0 in | Wt 247.8 lb

## 2023-03-25 DIAGNOSIS — Z8546 Personal history of malignant neoplasm of prostate: Secondary | ICD-10-CM | POA: Insufficient documentation

## 2023-03-25 DIAGNOSIS — F341 Dysthymic disorder: Secondary | ICD-10-CM

## 2023-03-25 DIAGNOSIS — Z0000 Encounter for general adult medical examination without abnormal findings: Secondary | ICD-10-CM | POA: Diagnosis not present

## 2023-03-25 DIAGNOSIS — I10 Essential (primary) hypertension: Secondary | ICD-10-CM | POA: Diagnosis not present

## 2023-03-25 DIAGNOSIS — Z13220 Encounter for screening for lipoid disorders: Secondary | ICD-10-CM

## 2023-03-25 DIAGNOSIS — Z131 Encounter for screening for diabetes mellitus: Secondary | ICD-10-CM | POA: Diagnosis not present

## 2023-03-25 DIAGNOSIS — E611 Iron deficiency: Secondary | ICD-10-CM

## 2023-03-25 LAB — CBC WITH DIFFERENTIAL/PLATELET
Basophils Absolute: 0.1 10*3/uL (ref 0.0–0.1)
Basophils Relative: 0.7 % (ref 0.0–3.0)
Eosinophils Absolute: 0.2 10*3/uL (ref 0.0–0.7)
Eosinophils Relative: 1.7 % (ref 0.0–5.0)
HCT: 45.6 % (ref 39.0–52.0)
Hemoglobin: 14.2 g/dL (ref 13.0–17.0)
Lymphocytes Relative: 21 % (ref 12.0–46.0)
Lymphs Abs: 1.8 10*3/uL (ref 0.7–4.0)
MCHC: 31 g/dL (ref 30.0–36.0)
MCV: 80.6 fl (ref 78.0–100.0)
Monocytes Absolute: 0.8 10*3/uL (ref 0.1–1.0)
Monocytes Relative: 8.9 % (ref 3.0–12.0)
Neutro Abs: 5.9 10*3/uL (ref 1.4–7.7)
Neutrophils Relative %: 67.7 % (ref 43.0–77.0)
Platelets: 276 10*3/uL (ref 150.0–400.0)
RBC: 5.66 Mil/uL (ref 4.22–5.81)
RDW: 15.9 % — ABNORMAL HIGH (ref 11.5–15.5)
WBC: 8.7 10*3/uL (ref 4.0–10.5)

## 2023-03-25 LAB — LIPID PANEL
Cholesterol: 181 mg/dL (ref 0–200)
HDL: 40 mg/dL (ref >39.00)
LDL Cholesterol: 118 mg/dL — ABNORMAL HIGH (ref 0–99)
NonHDL: 141.24
Total CHOL/HDL Ratio: 5
Triglycerides: 116 mg/dL (ref 0.0–149.0)
VLDL: 23.2 mg/dL (ref 0.0–40.0)

## 2023-03-25 LAB — COMPREHENSIVE METABOLIC PANEL
ALT: 26 U/L (ref 0–53)
AST: 28 U/L (ref 0–37)
Albumin: 4.3 g/dL (ref 3.5–5.2)
Alkaline Phosphatase: 61 U/L (ref 39–117)
BUN: 11 mg/dL (ref 6–23)
CO2: 27 mEq/L (ref 19–32)
Calcium: 9.1 mg/dL (ref 8.4–10.5)
Chloride: 103 mEq/L (ref 96–112)
Creatinine, Ser: 1.08 mg/dL (ref 0.40–1.50)
GFR: 72.85 mL/min (ref >60.00)
Glucose, Bld: 97 mg/dL (ref 70–99)
Potassium: 4.2 mEq/L (ref 3.5–5.1)
Sodium: 139 mEq/L (ref 135–145)
Total Bilirubin: 0.4 mg/dL (ref 0.2–1.2)
Total Protein: 6.8 g/dL (ref 6.0–8.3)

## 2023-03-25 LAB — HEMOGLOBIN A1C: Hgb A1c MFr Bld: 5.8 % (ref 4.6–6.5)

## 2023-03-25 LAB — TSH: TSH: 1.89 u[IU]/mL (ref 0.35–5.50)

## 2023-03-25 LAB — PSA: PSA: 0 ng/mL — ABNORMAL LOW (ref 0.10–4.00)

## 2023-03-25 NOTE — Progress Notes (Signed)
Established Patient Office Visit   Subjective:  Patient ID: Jonathan Edwards, male    DOB: 10-Apr-1961  Age: 62 y.o. MRN: [id number]  Chief Complaint  Patient presents with   Annual Exam    CPE, pt is fasting. Occasional hernia pain. Had sx 20 years ago mesh was used.     HPI Encounter Diagnoses  Name Primary?   Healthcare maintenance Yes   Iron deficiency    Essential hypertension    Dysthymia    History of prostate cancer    Screening for cholesterol level    Screening for diabetes mellitus    For physical and follow-up of above issues.  Doing well.  Continues working at the [employer].  He gets over 10,000 steps daily.  In need of dental care.  He lives at home with his wife.  They have a good relationship.  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  Injections and Wellbutrin do help.  Blood pressure controlled with amlodipine and lisinopril.  There were no polyps found in the colonoscopy in 2022.  He is due for dental work.   Review of Systems  Constitutional: Negative.   HENT: Negative.    Eyes:  Negative for blurred vision, discharge and redness.  Respiratory: Negative.    Cardiovascular: Negative.   Gastrointestinal:  Negative for abdominal pain, blood in stool and melena.  Genitourinary: Negative.  Negative for hematuria.  Musculoskeletal: Negative.  Negative for myalgias.  Skin:  Negative for rash.  Neurological:  Negative for tingling, loss of consciousness and weakness.  Endo/Heme/Allergies:  Negative for polydipsia.     Current Outpatient Medications:    acetaminophen (TYLENOL) 325 MG tablet, Take 650 mg by mouth every 6 (six) hours as needed for moderate pain or headache., Disp: , Rfl:    amLODipine (NORVASC) 5 MG tablet, TAKE 1 TABLET (5 MG TOTAL) BY MOUTH DAILY., Disp: 90 tablet, Rfl: 1   Bioflavonoid Products (BIOFLEX PO), Take by mouth., Disp: , Rfl:    buPROPion (WELLBUTRIN XL) 150 MG 24 hr tablet, TAKE 1 TABLET BY MOUTH EVERY  DAY, Disp: 90 tablet, Rfl: 1   cyclobenzaprine (FLEXERIL) 10 MG tablet, Take 10 mg by mouth 3 (three) times daily as needed for muscle spasms., Disp: , Rfl:    diclofenac Sodium (VOLTAREN) 1 % GEL, Apply a small grape sized dollop to right knee 4 times daily as needed for pain., Disp: 150 g, Rfl: 2   Flaxseed, Linseed, (FLAX SEEDS PO), Take by mouth., Disp: , Rfl:    Ginger, Zingiber officinalis, (GINGER PO), Take by mouth., Disp: , Rfl:    lisinopril (ZESTRIL) 20 MG tablet, TAKE 1 TABLET BY MOUTH EVERY DAY, Disp: 90 tablet, Rfl: 3   magnesium 30 MG tablet, Take 30 mg by mouth daily., Disp: , Rfl:    Multiple Vitamin (MULTIVITAMIN WITH MINERALS) TABS tablet, Take 1 tablet by mouth daily., Disp: , Rfl:    Omega-3 Fatty Acids (OMEGA 3 PO), Take 1 capsule by mouth daily., Disp: , Rfl:    Probiotic CAPS, Take 1 capsule by mouth daily., Disp: , Rfl:    VITAMIN D PO, Take 1 capsule by mouth daily., Disp: , Rfl:    VITAMIN E PO, Take by mouth., Disp: , Rfl:    zinc gluconate 50 MG tablet, Take 50 mg by mouth daily., Disp: , Rfl:    Objective:     BP 130/84   Pulse 79   Temp (!) 97.3 F (36.3 C)  Ht 6\' 2"  (1.88 m)   Wt 247 lb 12.8 oz (112.4 kg)   SpO2 98%   BMI 31.82 kg/m    Physical Exam Constitutional:      General: He is not in acute distress.    Appearance: Normal appearance. He is not ill-appearing, toxic-appearing or diaphoretic.  HENT:     Head: Normocephalic and atraumatic.     Right Ear: Tympanic membrane, ear canal and external ear normal.     Left Ear: Tympanic membrane, ear canal and external ear normal.     Mouth/Throat:     Mouth: Mucous membranes are moist.     Pharynx: Oropharynx is clear. No oropharyngeal exudate or posterior oropharyngeal erythema.  Eyes:     General: No scleral icterus.       Right eye: No discharge.        Left eye: No discharge.     Extraocular Movements: Extraocular movements intact.     Conjunctiva/sclera: Conjunctivae normal.     Pupils:  Pupils are equal, round, and reactive to light.  Cardiovascular:     Rate and Rhythm: Normal rate and regular rhythm.  Pulmonary:     Effort: Pulmonary effort is normal. No respiratory distress.     Breath sounds: Normal breath sounds.  Abdominal:     General: Bowel sounds are normal.     Tenderness: There is no abdominal tenderness. There is no guarding or rebound.     Hernia: A hernia is present. Hernia is present in the ventral area.  Musculoskeletal:     Cervical back: No rigidity or tenderness.  Lymphadenopathy:     Cervical: No cervical adenopathy.  Skin:    General: Skin is warm and dry.  Neurological:     Mental Status: He is alert and oriented to person, place, and time.  Psychiatric:        Mood and Affect: Mood normal.        Behavior: Behavior normal.      No results found for any visits on 03/25/23.    The 10-year ASCVD risk score (Arnett DK, et al., 2019) is: 15.7%    Assessment & Plan:   Healthcare maintenance  Iron deficiency -     CBC with Differential/Platelet -     Iron, TIBC and Ferritin Panel  Essential hypertension -     CBC with Differential/Platelet -     Comprehensive metabolic panel  Dysthymia -     TSH  History of prostate cancer -     PSA  Screening for cholesterol level -     Lipid panel  Screening for diabetes mellitus -     Hemoglobin A1c    Return in about 1 year (around 03/24/2024), or if symptoms worsen or fail to improve.  Encouraged him to continue his healthy and active lifestyle.  Encouraged follow-up with a dentist.  Continue all medications as listed above.  Information was given on health maintenance and disease prevention.  Information was given on managing hypertension.  Mliss Sax, MD

## 2023-03-26 MED ORDER — IRON (FERROUS SULFATE) 325 (65 FE) MG PO TABS
325.0000 mg | ORAL_TABLET | ORAL | 3 refills | Status: DC
Start: 2023-03-26 — End: 2023-11-27

## 2023-03-26 NOTE — Addendum Note (Signed)
Addended by: Andrez Grime on: 03/26/2023 08:07 AM   Modules accepted: Orders

## 2023-03-27 ENCOUNTER — Ambulatory Visit: Payer: Federal, State, Local not specified - PPO | Admitting: Family Medicine

## 2023-04-03 ENCOUNTER — Other Ambulatory Visit: Payer: Self-pay

## 2023-04-03 ENCOUNTER — Encounter (HOSPITAL_BASED_OUTPATIENT_CLINIC_OR_DEPARTMENT_OTHER): Payer: Self-pay | Admitting: Emergency Medicine

## 2023-04-03 ENCOUNTER — Emergency Department (HOSPITAL_BASED_OUTPATIENT_CLINIC_OR_DEPARTMENT_OTHER): Payer: Federal, State, Local not specified - PPO

## 2023-04-03 DIAGNOSIS — K56609 Unspecified intestinal obstruction, unspecified as to partial versus complete obstruction: Secondary | ICD-10-CM | POA: Insufficient documentation

## 2023-04-03 DIAGNOSIS — K439 Ventral hernia without obstruction or gangrene: Secondary | ICD-10-CM | POA: Diagnosis not present

## 2023-04-03 DIAGNOSIS — K429 Umbilical hernia without obstruction or gangrene: Secondary | ICD-10-CM | POA: Diagnosis not present

## 2023-04-03 DIAGNOSIS — R1033 Periumbilical pain: Secondary | ICD-10-CM | POA: Diagnosis not present

## 2023-04-03 LAB — URINALYSIS, ROUTINE W REFLEX MICROSCOPIC
Bilirubin Urine: NEGATIVE
Glucose, UA: NEGATIVE mg/dL
Hgb urine dipstick: NEGATIVE
Ketones, ur: NEGATIVE mg/dL
Leukocytes,Ua: NEGATIVE
Nitrite: NEGATIVE
Protein, ur: NEGATIVE mg/dL
Specific Gravity, Urine: 1.015 (ref 1.005–1.030)
pH: 7 (ref 5.0–8.0)

## 2023-04-03 LAB — CBC WITH DIFFERENTIAL/PLATELET
Abs Immature Granulocytes: 0.02 10*3/uL (ref 0.00–0.07)
Basophils Absolute: 0 10*3/uL (ref 0.0–0.1)
Basophils Relative: 0 %
Eosinophils Absolute: 0.2 10*3/uL (ref 0.0–0.5)
Eosinophils Relative: 2 %
HCT: 43.8 % (ref 39.0–52.0)
Hemoglobin: 14.1 g/dL (ref 13.0–17.0)
Immature Granulocytes: 0 %
Lymphocytes Relative: 23 %
Lymphs Abs: 2.3 10*3/uL (ref 0.7–4.0)
MCH: 25.6 pg — ABNORMAL LOW (ref 26.0–34.0)
MCHC: 32.2 g/dL (ref 30.0–36.0)
MCV: 79.6 fL — ABNORMAL LOW (ref 80.0–100.0)
Monocytes Absolute: 0.9 10*3/uL (ref 0.1–1.0)
Monocytes Relative: 9 %
Neutro Abs: 6.3 10*3/uL (ref 1.7–7.7)
Neutrophils Relative %: 66 %
Platelets: 238 10*3/uL (ref 150–400)
RBC: 5.5 MIL/uL (ref 4.22–5.81)
RDW: 14.6 % (ref 11.5–15.5)
WBC: 9.7 10*3/uL (ref 4.0–10.5)
nRBC: 0 % (ref 0.0–0.2)

## 2023-04-03 LAB — COMPREHENSIVE METABOLIC PANEL
ALT: 33 U/L (ref 0–44)
AST: 33 U/L (ref 15–41)
Albumin: 4.2 g/dL (ref 3.5–5.0)
Alkaline Phosphatase: 58 U/L (ref 38–126)
Anion gap: 11 (ref 5–15)
BUN: 12 mg/dL (ref 8–23)
CO2: 27 mmol/L (ref 22–32)
Calcium: 9.2 mg/dL (ref 8.9–10.3)
Chloride: 100 mmol/L (ref 98–111)
Creatinine, Ser: 1.19 mg/dL (ref 0.61–1.24)
GFR, Estimated: >60 mL/min (ref >60)
Glucose, Bld: 100 mg/dL — ABNORMAL HIGH (ref 70–99)
Potassium: 4.1 mmol/L (ref 3.5–5.1)
Sodium: 138 mmol/L (ref 135–145)
Total Bilirubin: 0.7 mg/dL (ref 0.3–1.2)
Total Protein: 7.5 g/dL (ref 6.5–8.1)

## 2023-04-03 LAB — LIPASE, BLOOD: Lipase: 31 U/L (ref 11–51)

## 2023-04-03 MED ORDER — IOHEXOL 300 MG/ML  SOLN
125.0000 mL | Freq: Once | INTRAMUSCULAR | Status: AC | PRN
  Administered 2023-04-03: 125 mL via INTRAVENOUS

## 2023-04-03 NOTE — ED Triage Notes (Signed)
Periumbilical abdominal pain onset yesterday. Pain is intermittent, no radiation. Tender with palpation. No urinary sx. No n/v/d.    Reports hx of "double hernia" with mesh repair

## 2023-04-04 ENCOUNTER — Emergency Department (HOSPITAL_BASED_OUTPATIENT_CLINIC_OR_DEPARTMENT_OTHER)
Admission: EM | Admit: 2023-04-04 | Discharge: 2023-04-04 | Disposition: A | Discharge status: Home / Self Care | Payer: Federal, State, Local not specified - PPO | Attending: Emergency Medicine | Admitting: Emergency Medicine

## 2023-04-04 DIAGNOSIS — K56609 Unspecified intestinal obstruction, unspecified as to partial versus complete obstruction: Secondary | ICD-10-CM

## 2023-04-04 DIAGNOSIS — K5669 Other partial intestinal obstruction: Secondary | ICD-10-CM | POA: Diagnosis not present

## 2023-04-04 DIAGNOSIS — R1033 Periumbilical pain: Secondary | ICD-10-CM | POA: Diagnosis not present

## 2023-04-04 DIAGNOSIS — I10 Essential (primary) hypertension: Secondary | ICD-10-CM | POA: Diagnosis not present

## 2023-04-04 DIAGNOSIS — K430 Incisional hernia with obstruction, without gangrene: Secondary | ICD-10-CM | POA: Diagnosis not present

## 2023-04-04 DIAGNOSIS — K219 Gastro-esophageal reflux disease without esophagitis: Secondary | ICD-10-CM | POA: Diagnosis not present

## 2023-04-04 DIAGNOSIS — K439 Ventral hernia without obstruction or gangrene: Secondary | ICD-10-CM

## 2023-04-04 MED ORDER — HYDROMORPHONE HCL 1 MG/ML IJ SOLN
1.0000 mg | Freq: Once | INTRAMUSCULAR | Status: AC
  Administered 2023-04-04: 1 mg via INTRAVENOUS
  Filled 2023-04-04: qty 1

## 2023-04-04 NOTE — ED Provider Notes (Signed)
[facility]  Provider Note  CSN: [id number] Arrival date & time: 04/03/23 2153  History Chief Complaint  Patient presents with   Abdominal Pain    Jonathan Edwards is a 62 y.o. male with remote history of ventral hernia repair in 2012 with mesh has had occasional hernia pain over the years but more severe and persistent in the last 2 days with one episode of vomiting after arriving here, no vomiting at home. No fever. No dysuria.    Home Medications Prior to Admission medications   Medication Sig Start Date End Date Taking? Authorizing Provider  acetaminophen (TYLENOL) 325 MG tablet Take 650 mg by mouth every 6 (six) hours as needed for moderate pain or headache.    [provider]  amLODipine (NORVASC) 5 MG tablet TAKE 1 TABLET (5 MG TOTAL) BY MOUTH DAILY. 12/24/22   Mliss Sax, MD  Bioflavonoid Products (BIOFLEX PO) Take by mouth.    [provider]  buPROPion (WELLBUTRIN XL) 150 MG 24 hr tablet TAKE 1 TABLET BY MOUTH EVERY DAY 03/03/23   Mliss Sax, MD  cyclobenzaprine (FLEXERIL) 10 MG tablet Take 10 mg by mouth 3 (three) times daily as needed for muscle spasms.    [provider]  diclofenac Sodium (VOLTAREN) 1 % GEL Apply a small grape sized dollop to right knee 4 times daily as needed for pain. 03/26/22   Mliss Sax, MD  Flaxseed, Linseed, (FLAX SEEDS PO) Take by mouth.    [provider]  Ginger, Zingiber officinalis, (GINGER PO) Take by mouth.    [provider]  Iron, Ferrous Sulfate, 325 (65 Fe) MG TABS Take 325 mg by mouth every other day. 03/26/23   Mliss Sax, MD  lisinopril (ZESTRIL) 20 MG tablet TAKE 1 TABLET BY MOUTH EVERY DAY 06/20/22   Mliss Sax, MD  magnesium 30 MG tablet Take 30 mg by mouth daily.    [provider]  Multiple Vitamin (MULTIVITAMIN WITH MINERALS) TABS tablet Take 1 tablet by mouth daily.    [provider]  Omega-3 Fatty Acids (OMEGA 3 PO) Take 1 capsule by mouth daily.    [provider]  Probiotic CAPS Take 1 capsule by mouth daily.    [provider]  VITAMIN D PO Take 1 capsule by mouth daily.    [provider]  VITAMIN E PO Take by mouth.    [provider]  zinc gluconate 50 MG tablet Take 50 mg by mouth daily.    [provider]     Allergies    Patient has no known allergies.   Review of Systems   Review of Systems Please see HPI for pertinent positives and negatives  Physical Exam BP (!) 141/93   Pulse 69   Temp 98.1 F (36.7 C) (Oral)   Resp 18   SpO2 95%   Physical Exam Vitals and nursing note reviewed.  Constitutional:      Appearance: Normal appearance.  HENT:     Head: Normocephalic and atraumatic.     Nose: Nose normal.     Mouth/Throat:     Mouth: Mucous membranes are moist.  Eyes:     Extraocular Movements: Extraocular movements intact.     Conjunctiva/sclera: Conjunctivae normal.  Cardiovascular:     Rate and Rhythm: Normal rate.  Pulmonary:     Effort: Pulmonary effort is normal.     Breath sounds: Normal breath sounds.  Abdominal:     General: Abdomen is flat.     Palpations: Abdomen is soft.     Tenderness: There is abdominal tenderness in the periumbilical area. There is guarding. Negative signs include Murphy's sign and McBurney's sign.  Musculoskeletal:        General: No swelling. Normal range of motion.     Cervical back: Neck supple.  Skin:    General: Skin is warm and dry.  Neurological:     General: No focal deficit present.     Mental Status: He is alert.  Psychiatric:        Mood and Affect: Mood normal.     ED Results / Procedures / Treatments   EKG None  Procedures Procedures  Medications Ordered in the ED Medications  iohexol (OMNIPAQUE) 300 MG/ML solution 125 mL (125 mLs Intravenous Contrast Given 04/03/23 2339)  HYDROmorphone (DILAUDID) injection 1 mg (1  mg Intravenous Given 04/04/23 0044)    Initial Impression and Plan  Patient here with periumbilical pain at site of prior hernia repair. Labs done in triage show unremarkable CBC, CMP, lipase and UA. I personally viewed the images from radiology studies and agree with radiologist interpretation: CT shows a SBO due to loop of bowel in broad based umbilical hernia.  Unable to definitively identify hernia on initial exam due to tenderness. Will give pain medication and reassess.   ED Course   Clinical Course as of 04/04/23 0327  Caleen Essex Apr 04, 2023  0149 After pain medication, I was unable to reduce his hernia. Will discuss with Gen Surg.  [CS]  [phone] Spoke with Dr. Hillery Hunter, General Surgery who requests the patient be sent to the [facility] for evaluation. Dr. Posey Rea aware.  [CS]    Clinical Course User Index [CS] Pollyann Savoy, MD     MDM Rules/Calculators/A&P Medical Decision Making Problems Addressed: Incarcerated hernia: acute illness or injury Small bowel obstruction [facility]): acute illness or injury  Amount and/or Complexity of Data Reviewed Labs: ordered. Decision-making details documented in ED Course. Radiology: ordered and independent interpretation performed. Decision-making details documented in ED Course.  Risk Prescription drug management. Parenteral controlled substances. Decision regarding hospitalization.     Final Clinical Impression(s) / ED Diagnoses Final diagnoses:  Incarcerated hernia  Small bowel obstruction [facility])    Rx / DC Orders ED Discharge Orders     None        Pollyann Savoy, MD 04/04/23 [phone]

## 2023-04-04 NOTE — ED Notes (Signed)
Pt arrives from [facility] to [facility] ED

## 2023-04-04 NOTE — ED Notes (Signed)
[facility] called for ED to ED transfer to [facility] @ 03:24am  No ETA

## 2023-04-04 NOTE — ED Notes (Signed)
Biscuit provided

## 2023-04-04 NOTE — Consult Note (Signed)
Jonathan Edwards 22-Aug-1960  [id number].    Requesting MD: ED Chief Complaint/Reason for Consult: SBO  HPI:  62 y/o M w/ a hx of GERD, HTN, umbilical hernia w/ mesh (2012) and prostate CA s/p robotic prostatectomy in 2021 who presented to a free standing ED with 2 days of abdominal pain. CT showed a peri-umbilical hernia with a related SBO.  Cr 1.2. CBC WNL.  He was transferred to [facility] ED and arrived in stable condition.  On exam, patient is resting comfortably.  NAD.  He reports that he has been able to pass flatus.  Last BM yesterday.  He has experienced intermittent pain at the site of his hernia since undergoing his prostatectomy but has not been seen by a surgeon.    He is not on anti-coagulation.  Denies DM.  Quit smoking 20 years ago.    ROS: Review of Systems  Constitutional: Negative.   HENT: Negative.    Eyes: Negative.   Respiratory: Negative.    Cardiovascular: Negative.   Gastrointestinal:  Positive for abdominal pain.  Genitourinary: Negative.   Musculoskeletal: Negative.   Skin: Negative.   Neurological: Negative.   Endo/Heme/Allergies: Negative.   Psychiatric/Behavioral: Negative.      Family History  Problem Relation Age of Onset   Colon cancer Neg Hx    Colon polyps Neg Hx    Esophageal cancer Neg Hx    Rectal cancer Neg Hx    Stomach cancer Neg Hx     Past Medical History:  Diagnosis Date   Cancer [facility])    prostate  2021   Cataract    GERD (gastroesophageal reflux disease)    Glaucoma    Hypertension    Pneumonia    Tuberculosis    + ppd test  when 62 years old never had any symptoms    Past Surgical History:  Procedure Laterality Date   COLONOSCOPY  2011   EYE SURGERY     hole repair left eye and detached retina surgery   HERNIA REPAIR     umbilical with mesh   LAPAROSCOPIC LYSIS OF ADHESIONS N/A 03/06/2020   Procedure: LAPAROSCOPIC LYSIS OF ADHESIONS;  Surgeon: Crista Elliot, MD;  Location: [facility] ORS;  Service: Urology;  Laterality:  N/A;   PELVIC LYMPH NODE DISSECTION Bilateral 03/06/2020   Procedure: BILATERAL PELVIC LYMPH NODE DISSECTION;  Surgeon: Crista Elliot, MD;  Location: [facility] ORS;  Service: Urology;  Laterality: Bilateral;   ROBOT ASSISTED LAPAROSCOPIC RADICAL PROSTATECTOMY N/A 03/06/2020   Procedure: XI ROBOTIC ASSISTED LAPAROSCOPIC RADICAL PROSTATECTOMY;  Surgeon: Crista Elliot, MD;  Location: [facility] ORS;  Service: Urology;  Laterality: N/A;   TONSILLECTOMY      Social History:  reports that he quit smoking about 24 years ago. His smoking use included cigarettes. He started smoking about 54 years ago. He has never used smokeless tobacco. He reports current alcohol use. He reports that he does not use drugs.  Allergies: No Known Allergies  (Not in a hospital admission)   Physical Exam: Blood pressure (!) 136/99, pulse 72, temperature (!) 97.5 F (36.4 C), temperature source Oral, resp. rate 17, SpO2 97%. Gen: adult male, NAD Resp: equal chest rise, no increased WOB CV: RRR Abd: soft, non-distended, peri-umbilical fascial defect c/w the hernia seen on the CT scan, no sign of incarceration/strangulation, non-tender to palpation, no overlying skin changes Neuro: moving all extremities   Results for orders placed or performed during the hospital encounter of 04/04/23 (  from the past 48 hour(s))  Lipase, blood     Status: None   Collection Time: 04/03/23 10:28 PM  Result Value Ref Range   Lipase 31 11 - 51 U/L    Comment: Performed at [facility], [address] [place], [place] [address]  Comprehensive metabolic panel     Status: Abnormal   Collection Time: 04/03/23 10:28 PM  Result Value Ref Range   Sodium 138 135 - 145 mmol/L   Potassium 4.1 3.5 - 5.1 mmol/L   Chloride 100 98 - 111 mmol/L   CO2 27 22 - 32 mmol/L   Glucose, Bld 100 (H) 70 - 99 mg/dL    Comment: Glucose reference range applies only to samples taken after fasting for at least 8 hours.   BUN 12 8 - 23 mg/dL   Creatinine,  Ser 6.04 0.61 - 1.24 mg/dL   Calcium 9.2 8.9 - 54.0 mg/dL   Total Protein 7.5 6.5 - 8.1 g/dL   Albumin 4.2 3.5 - 5.0 g/dL   AST 33 15 - 41 U/L   ALT 33 0 - 44 U/L   Alkaline Phosphatase 58 38 - 126 U/L   Total Bilirubin 0.7 0.3 - 1.2 mg/dL   GFR, Estimated >98 >11 mL/min    Comment: (NOTE) Calculated using the CKD-EPI Creatinine Equation (2021)    Anion gap 11 5 - 15    Comment: Performed at [facility], [address] [place], [place] [address]  CBC with Differential     Status: Abnormal   Collection Time: 04/03/23 10:28 PM  Result Value Ref Range   WBC 9.7 4.0 - 10.5 K/uL   RBC 5.50 4.22 - 5.81 MIL/uL   Hemoglobin 14.1 13.0 - 17.0 g/dL   HCT 29.5 62.1 - 30.8 %   MCV 79.6 (L) 80.0 - 100.0 fL   MCH 25.6 (L) 26.0 - 34.0 pg   MCHC 32.2 30.0 - 36.0 g/dL   RDW 65.7 84.6 - 96.2 %   Platelets 238 150 - 400 K/uL   nRBC 0.0 0.0 - 0.2 %   Neutrophils Relative % 66 %   Neutro Abs 6.3 1.7 - 7.7 K/uL   Lymphocytes Relative 23 %   Lymphs Abs 2.3 0.7 - 4.0 K/uL   Monocytes Relative 9 %   Monocytes Absolute 0.9 0.1 - 1.0 K/uL   Eosinophils Relative 2 %   Eosinophils Absolute 0.2 0.0 - 0.5 K/uL   Basophils Relative 0 %   Basophils Absolute 0.0 0.0 - 0.1 K/uL   Immature Granulocytes 0 %   Abs Immature Granulocytes 0.02 0.00 - 0.07 K/uL    Comment: Performed at [facility], [address] [place], [place] [address]  Urinalysis, Routine w reflex microscopic -Urine, Clean Catch     Status: None   Collection Time: 04/03/23 11:44 PM  Result Value Ref Range   Color, Urine YELLOW YELLOW   APPearance CLEAR CLEAR   Specific Gravity, Urine 1.015 1.005 - 1.030   pH 7.0 5.0 - 8.0   Glucose, UA NEGATIVE NEGATIVE mg/dL   Hgb urine dipstick NEGATIVE NEGATIVE   Bilirubin Urine NEGATIVE NEGATIVE   Ketones, ur NEGATIVE NEGATIVE mg/dL   Protein, ur NEGATIVE NEGATIVE mg/dL   Nitrite NEGATIVE NEGATIVE   Leukocytes,Ua NEGATIVE NEGATIVE    Comment: Microscopic not done on  urines with negative protein, blood, leukocytes, nitrite, or glucose < 500 mg/dL. Performed at [facility], [address] [place], [place] [address]    CT ABDOMEN PELVIS W CONTRAST  Result Date: 04/03/2023 CLINICAL DATA:  Postoperative abdominal pain. Prostate surgery and umbilical hernia repair. EXAM: CT ABDOMEN AND PELVIS WITH CONTRAST TECHNIQUE: Multidetector CT imaging of the abdomen and pelvis was performed using the standard protocol following bolus administration of intravenous contrast. RADIATION DOSE REDUCTION: This exam was performed according to the departmental dose-optimization program which includes automated exposure control, adjustment of the mA and/or kV according to patient size and/or use of iterative reconstruction technique. CONTRAST:  OMNIPAQUE IOHEXOL 300 MG/ML  SOLN COMPARISON:  CT abdomen and pelvis 03/12/2020 FINDINGS: Lower chest: No acute abnormality. Hepatobiliary: Subcentimeter rounded hypodensities in the liver are too small to characterize and unchanged, favored as cysts or hemangiomas. The liver is otherwise within normal limits. The gallbladder and bile ducts appear within normal limits. Pancreas: Unremarkable. No pancreatic ductal dilatation or surrounding inflammatory changes. Spleen: Normal in size without focal abnormality. Adrenals/Urinary Tract: Adrenal glands are unremarkable. Kidneys are normal, without renal calculi, focal lesion, or hydronephrosis. Bladder is unremarkable. Stomach/Bowel: Small wide-mouth umbilical hernia is present containing a loop of small bowel with some wall thickening and fluid in the hernia sac. Small bowel proximal to this level is mildly dilated measuring up to 3.4 cm and fluid-filled with some mesenteric edema. Small bowel distal to this level appears decompressed. There is no pneumatosis or free air. The appendix is within normal limits. Vascular/Lymphatic: Aortic atherosclerosis. No enlarged abdominal or pelvic lymph  nodes. Reproductive: Prostate is surgically absent. Prostate bed is unremarkable. Other: There is trace free fluid in the pelvis. There is an additional small fat containing midline ventral hernia in the upper abdominal wall which has increased in size compared to prior. Musculoskeletal: No acute or significant osseous findings. IMPRESSION: 1. Small bowel obstruction secondary to wide-mouth umbilical hernia containing a loop of small bowel. There is wall thickening and fluid in the hernia sac with mesenteric edema concerning for incarceration possible vascular compromise. No pneumatosis or free air. 2. Trace free fluid in the pelvis. 3. Additional small fat containing midline ventral hernia in the upper abdominal wall has increased in size. 4. Prior prostatectomy. 5. Aortic atherosclerosis. Aortic Atherosclerosis (ICD10-I70.0). Electronically Signed   By: Darliss Cheney M.D.   On: 04/03/2023 23:57    Assessment/Plan 62 y/o M w/ a hx of prior umbilical hernia repair with mesh and robotic prostatectomy c/b incisional hernia who presented to the ED with abdominal pain with CT showing a partial SBO related to his incisional hernia  - No indication for urgent surgical intervention.  It appears the hernia has been reduced by the ED physician.  He is not clinically obstructed and is not showing signs of bowel compromise - Would attempt a PO challenge in the ED. If he is able to tolerate PO without abdominal pain, nausea, or emesis then he would be safe for discharge to home with strict return precautions - We will plan to schedule him for follow up in surgery clinic for discussion of elective repair  - Please page the on call PA/physician with any questions/concerns  I reviewed last 24 h vitals and pain scores, last 24 h labs and trends, and last 24 h imaging results.  Evaluation and management of this patient required review of outside notes, review of prior labs, an assessment from an independent historian, as  well personal review of his CT scan.   Tacy [facility] 04/04/2023, 5:27 AM Please see  Amion for pager number during day hours 7:00am-4:30pm or 7:00am -11:30am on weekends

## 2023-04-05 NOTE — ED Provider Notes (Signed)
  Physical Exam  BP (!) 148/98   Pulse 71   Temp (!) 97.5 F (36.4 C) (Oral)   Resp 17   SpO2 95%   Physical Exam Vitals and nursing note reviewed.  Constitutional:      General: He is not in acute distress.    Appearance: He is well-developed.  HENT:     Head: Normocephalic and atraumatic.  Eyes:     Conjunctiva/sclera: Conjunctivae normal.  Cardiovascular:     Rate and Rhythm: Normal rate and regular rhythm.     Heart sounds: No murmur heard. Pulmonary:     Effort: Pulmonary effort is normal. No respiratory distress.     Breath sounds: Normal breath sounds.  Abdominal:     Palpations: Abdomen is soft.     Tenderness: There is no abdominal tenderness.  Musculoskeletal:        General: No swelling.     Cervical back: Neck supple.  Skin:    General: Skin is warm and dry.     Capillary Refill: Capillary refill takes less than 2 seconds.  Neurological:     Mental Status: He is alert.  Psychiatric:        Mood and Affect: Mood normal.     Procedures  Hernia reduction  Date/Time: 04/05/2023 3:04 PM  Performed by: Glendora Score, MD Authorized by: Glendora Score, MD  Consent: Verbal consent obtained. Local anesthesia used: no  Anesthesia: Local anesthesia used: no  Sedation: Patient sedated: no  Patient tolerance: patient tolerated the procedure well with no immediate complications     ED Course / MDM   Clinical Course as of 04/05/23 1504  Fri Apr 04, 2023  0149 After pain medication, I was unable to reduce his hernia. Will discuss with Gen Surg.  [CS]  [phone] Spoke with Dr. Hillery Hunter, General Surgery who requests the patient be sent to the [facility] for evaluation. Dr. Posey Rea aware.  [CS]    Clinical Course User Index [CS] Pollyann Savoy, MD   Medical Decision Making Amount and/or Complexity of Data Reviewed Labs: ordered.  Risk Prescription drug management.   Patient received in transfer.  Ventral hernia with concern for incarceration and  associated SBO.  Initial plan was for surgical evaluation and admission for surgical repair.  However, on arrival, patient is quite comfortable after Dilaudid administration and I was able to successfully reduce the ventral hernia at bedside with gentle pressure.  Dr. Hillery Hunter came to evaluate the patient and agrees that this has been successfully reduced.  He is recommending p.o. challenge and outpatient follow-up if patient is successfully able to tolerate p.o. without difficulty.  Patient was able to eat and drink without difficulty and without return of his pain.  Patient discharged with outpatient surgical pain and strict return precautions of which he and his wife voiced understanding       Zavon Hyson, Wyn Forster, MD 04/05/23 1506

## 2023-04-07 ENCOUNTER — Telehealth: Payer: Self-pay | Admitting: Family Medicine

## 2023-04-07 NOTE — Telephone Encounter (Signed)
Pt said he went to e.d. on Thursday for a hernia. Pt want to know if he can get a letter for light duty letter until he meet with the surgeon on 9.12.24 .please give the pt a call ( pt said give a call in the morning because he work 2nd shift )

## 2023-04-08 NOTE — Telephone Encounter (Signed)
Pt called to check the status of this letter. I let him know Dr Doreene Burke said yes and he would get a call when ready.

## 2023-04-11 ENCOUNTER — Telehealth: Payer: Self-pay | Admitting: Family Medicine

## 2023-04-11 NOTE — Telephone Encounter (Signed)
04/11/23 - Pt dropped off  "Request for temporary light duty" form to be filled out by provider. He wants a call back when form is ready for pick up. Form is in the PCP folder at [facility]

## 2023-04-16 ENCOUNTER — Telehealth: Payer: Self-pay

## 2023-04-16 NOTE — Telephone Encounter (Signed)
Transition Care Management Follow-up Telephone Call Date of discharge and from where: [facility] 8/23 How have you been since you were released from the hospital? Doing ok and following up with provider Any questions or concerns? No  Items Reviewed: Did the pt receive and understand the discharge instructions provided? Yes  Medications obtained and verified? No  Other? No  Any new allergies since your discharge? No  Dietary orders reviewed? No Do you have support at home? Yes     Follow up appointments reviewed:  PCP Hospital f/u appt confirmed? Yes  Scheduled to see PCP on  @ . Specialist Hospital f/u appt confirmed? Yes  Scheduled to see  on 9/13 @ . Are transportation arrangements needed? No  If their condition worsens, is the pt aware to call PCP or go to the Emergency Dept.? Yes Was the patient provided with contact information for the PCP's office or ED? Yes Was to pt encouraged to call back with questions or concerns? Yes

## 2023-04-18 DIAGNOSIS — Z0279 Encounter for issue of other medical certificate: Secondary | ICD-10-CM

## 2023-04-25 DIAGNOSIS — K432 Incisional hernia without obstruction or gangrene: Secondary | ICD-10-CM | POA: Diagnosis not present

## 2023-05-02 ENCOUNTER — Encounter: Payer: Self-pay | Admitting: Family Medicine

## 2023-05-02 NOTE — Telephone Encounter (Signed)
See prev patient message for follow-up.

## 2023-05-02 NOTE — Telephone Encounter (Signed)
Called pt to get scheduled to be seen regarding his [facility] message. Scheduled with PCP on Monday, 05/05/23 at 8:20.

## 2023-05-05 ENCOUNTER — Ambulatory Visit: Payer: Federal, State, Local not specified - PPO | Admitting: Family Medicine

## 2023-05-05 ENCOUNTER — Encounter: Payer: Self-pay | Admitting: Family Medicine

## 2023-05-05 VITALS — BP 142/92 | HR 86 | Temp 98.0°F | Ht 74.0 in | Wt 252.2 lb

## 2023-05-05 DIAGNOSIS — F341 Dysthymic disorder: Secondary | ICD-10-CM

## 2023-05-05 DIAGNOSIS — E611 Iron deficiency: Secondary | ICD-10-CM | POA: Diagnosis not present

## 2023-05-05 DIAGNOSIS — Z566 Other physical and mental strain related to work: Secondary | ICD-10-CM | POA: Diagnosis not present

## 2023-05-05 MED ORDER — BUPROPION HCL ER (XL) 150 MG PO TB24: 150.0000 mg | ORAL_TABLET | Freq: Two times a day (BID) | ORAL | 1 refills | Status: DC

## 2023-05-05 NOTE — Progress Notes (Signed)
Established Patient Office Visit   Subjective:  Patient ID: Jonathan Edwards, male    DOB: 19-Sep-1960  Age: 62 y.o. MRN: [id number]  Chief Complaint  Patient presents with   Medical Management of Chronic Issues   Anxiety    Pt requesting a increase in medication due to stress at work.     Anxiety     Encounter Diagnoses  Name Primary?   Work-related stress Yes   Dysthymia    Iron deficiency    For follow-up of dysthymia and now with workplace stress secondary to unfair treatment.  He felt as though he had to file an EOC complaint.   Review of Systems  Constitutional: Negative.   HENT: Negative.    Eyes:  Negative for blurred vision, discharge and redness.  Respiratory: Negative.    Cardiovascular: Negative.   Gastrointestinal:  Negative for abdominal pain.  Genitourinary: Negative.   Musculoskeletal: Negative.  Negative for myalgias.  Skin:  Negative for rash.  Neurological:  Negative for tingling, loss of consciousness and weakness.  Endo/Heme/Allergies:  Negative for polydipsia.     Current Outpatient Medications:    acetaminophen (TYLENOL) 325 MG tablet, Take 650 mg by mouth every 6 (six) hours as needed for moderate pain or headache., Disp: , Rfl:    amLODipine (NORVASC) 5 MG tablet, TAKE 1 TABLET (5 MG TOTAL) BY MOUTH DAILY., Disp: 90 tablet, Rfl: 1   Bioflavonoid Products (BIOFLEX PO), Take by mouth., Disp: , Rfl:    buPROPion (WELLBUTRIN XL) 150 MG 24 hr tablet, Take 1 tablet (150 mg total) by mouth in the morning and at bedtime., Disp: 180 tablet, Rfl: 1   cyclobenzaprine (FLEXERIL) 10 MG tablet, Take 10 mg by mouth 3 (three) times daily as needed for muscle spasms., Disp: , Rfl:    diclofenac Sodium (VOLTAREN) 1 % GEL, Apply a small grape sized dollop to right knee 4 times daily as needed for pain., Disp: 150 g, Rfl: 2   Flaxseed, Linseed, (FLAX SEEDS PO), Take by mouth., Disp: , Rfl:    Ginger, Zingiber officinalis, (GINGER PO), Take by mouth., Disp: , Rfl:     Iron, Ferrous Sulfate, 325 (65 Fe) MG TABS, Take 325 mg by mouth every other day., Disp: 45 tablet, Rfl: 3   lisinopril (ZESTRIL) 20 MG tablet, TAKE 1 TABLET BY MOUTH EVERY DAY, Disp: 90 tablet, Rfl: 3   magnesium 30 MG tablet, Take 30 mg by mouth daily., Disp: , Rfl:    Multiple Vitamin (MULTIVITAMIN WITH MINERALS) TABS tablet, Take 1 tablet by mouth daily., Disp: , Rfl:    Omega-3 Fatty Acids (OMEGA 3 PO), Take 1 capsule by mouth daily., Disp: , Rfl:    Probiotic CAPS, Take 1 capsule by mouth daily., Disp: , Rfl:    VITAMIN D PO, Take 1 capsule by mouth daily., Disp: , Rfl:    VITAMIN E PO, Take by mouth., Disp: , Rfl:    zinc gluconate 50 MG tablet, Take 50 mg by mouth daily., Disp: , Rfl:    Objective:     BP (!) 142/92   Pulse 86   Temp 98 F (36.7 C)   Ht 6\' 2"  (1.88 m)   Wt 252 lb 3.2 oz (114.4 kg)   SpO2 98%   BMI 32.38 kg/m  BP Readings from Last 3 Encounters:  05/05/23 (!) 142/92  04/04/23 (!) 148/98  03/25/23 130/84   Wt Readings from Last 3 Encounters:  05/05/23 252 lb 3.2 oz (114.4 kg)  03/25/23 247 lb 12.8 oz (112.4 kg)  11/15/22 252 lb (114.3 kg)      Physical Exam Constitutional:      General: He is not in acute distress.    Appearance: Normal appearance. He is not ill-appearing, toxic-appearing or diaphoretic.  HENT:     Head: Normocephalic and atraumatic.     Right Ear: External ear normal.     Left Ear: External ear normal.  Eyes:     General: No scleral icterus.       Right eye: No discharge.        Left eye: No discharge.     Extraocular Movements: Extraocular movements intact.     Conjunctiva/sclera: Conjunctivae normal.  Pulmonary:     Effort: Pulmonary effort is normal. No respiratory distress.  Skin:    General: Skin is warm and dry.  Neurological:     Mental Status: He is alert and oriented to person, place, and time.  Psychiatric:        Mood and Affect: Mood normal.        Behavior: Behavior normal.      No results found for  any visits on 05/05/23.    The 10-year ASCVD risk score (Arnett DK, et al., 2019) is: 30%    Assessment & Plan:   Work-related stress -     buPROPion HCl ER (XL); Take 1 tablet (150 mg total) by mouth in the morning and at bedtime.  Dispense: 180 tablet; Refill: 1  Dysthymia -     buPROPion HCl ER (XL); Take 1 tablet (150 mg total) by mouth in the morning and at bedtime.  Dispense: 180 tablet; Refill: 1  Iron deficiency    Return in about 8 weeks (around 06/30/2023).  Have increased Wellbutrin to 150 XL twice daily.  We could consider counseling.  He would like to try the increased dosage first.  Encouraged him to continue with his iron tablets every other day.  Follow-up in 8 weeks.  Mliss Sax, MD

## 2023-06-11 ENCOUNTER — Ambulatory Visit: Payer: Self-pay | Admitting: General Surgery

## 2023-06-24 NOTE — Progress Notes (Signed)
Anesthesia Review:  PCP: Cardiologist : Chest x-ray : EKG : Echo : Stress test: Cardiac Cath :  Activity level:  Sleep Study/ CPAP : Fasting Blood Sugar :      / Checks Blood Sugar -- times a day:   Blood Thinner/ Instructions /Last Dose: ASA / Instructions/ Last Dose :  

## 2023-06-24 NOTE — Patient Instructions (Signed)
SURGICAL WAITING ROOM VISITATION  Patients having surgery or a procedure may have no more than 2 support people in the waiting area - these visitors may rotate.    Children under the age of 57 must have an adult with them who is not the patient.  Due to an increase in RSV and influenza rates and associated hospitalizations, children ages 60 and under may not visit patients in [facility] hospitals.  If the patient needs to stay at the hospital during part of their recovery, the visitor guidelines for inpatient rooms apply. Pre-op nurse will coordinate an appropriate time for 1 support person to accompany patient in pre-op.  This support person may not rotate.    Please refer to the [facility] website for the visitor guidelines for Inpatients (after your surgery is over and you are in a regular room).       Your procedure is scheduled on:  06/30/2023    Report to [facility] Main Entrance    Report to admitting at  0515 AM   Call this number if you have problems the morning of surgery [phone]   Do not eat food  or drink liquids :After Midnight.                            If you have questions, please contact your surgeon's office.       Oral Hygiene is also important to reduce your risk of infection.                                    Remember - BRUSH YOUR TEETH THE MORNING OF SURGERY WITH YOUR REGULAR TOOTHPASTE  DENTURES WILL BE REMOVED PRIOR TO SURGERY PLEASE DO NOT APPLY "Poly grip" OR ADHESIVES!!!   Do NOT smoke after Midnight   Stop all vitamins and herbal supplements 7 days before surgery.   Take these medicines the morning of surgery with A SIP OF WATER:  amlodipine, wellbutrin   DO NOT TAKE ANY ORAL DIABETIC MEDICATIONS DAY OF YOUR SURGERY  Bring CPAP mask and tubing day of surgery.                              You may not have any metal on your body including hair pins, jewelry, and body piercing             Do not wear make-up, lotions,  powders, perfumes/cologne, or deodorant  Do not wear nail polish including gel and S&S, artificial/acrylic nails, or any other type of covering on natural nails including finger and toenails. If you have artificial nails, gel coating, etc. that needs to be removed by a nail salon please have this removed prior to surgery or surgery may need to be canceled/ delayed if the surgeon/ anesthesia feels like they are unable to be safely monitored.   Do not shave  48 hours prior to surgery.               Men may shave face and neck.   Do not bring valuables to the hospital. [facility] IS NOT             RESPONSIBLE   FOR VALUABLES.   Contacts, glasses, dentures or bridgework may not be worn into surgery.   Bring small overnight bag day of surgery.  DO NOT BRING YOUR HOME MEDICATIONS TO THE HOSPITAL. PHARMACY WILL DISPENSE MEDICATIONS LISTED ON YOUR MEDICATION LIST TO YOU DURING YOUR ADMISSION IN THE HOSPITAL!    Patients discharged on the day of surgery will not be allowed to drive home.  Someone NEEDS to stay with you for the first 24 hours after anesthesia.   Special Instructions: Bring a copy of your healthcare power of attorney and living will documents the day of surgery if you haven't scanned them before.              Please read over the following fact sheets you were given: IF YOU HAVE QUESTIONS ABOUT YOUR PRE-OP INSTRUCTIONS PLEASE CALL [phone]   If you received a COVID test during your pre-op visit  it is requested that you wear a mask when out in public, stay away from anyone that may not be feeling well and notify your surgeon if you develop symptoms. If you test positive for Covid or have been in contact with anyone that has tested positive in the last 10 days please notify you surgeon.    [facility] Preparing for Surgery Before surgery, you can play an important role.  Because skin is not sterile, your skin needs to be as free of germs as possible.  You can reduce the  number of germs on your skin by washing with CHG (chlorahexidine gluconate) soap before surgery.  CHG is an antiseptic cleaner which kills germs and bonds with the skin to continue killing germs even after washing. Please DO NOT use if you have an allergy to CHG or antibacterial soaps.  If your skin becomes reddened/irritated stop using the CHG and inform your nurse when you arrive at Short Stay. Do not shave (including legs and underarms) for at least 48 hours prior to the first CHG shower.  You may shave your face/neck. Please follow these instructions carefully:  1.  Shower with CHG Soap the night before surgery and the  morning of Surgery.  2.  If you choose to wash your hair, wash your hair first as usual with your  normal  shampoo.  3.  After you shampoo, rinse your hair and body thoroughly to remove the  shampoo.                           4.  Use CHG as you would any other liquid soap.  You can apply chg directly  to the skin and wash                       Gently with a scrungie or clean washcloth.  5.  Apply the CHG Soap to your body ONLY FROM THE NECK DOWN.   Do not use on face/ open                           Wound or open sores. Avoid contact with eyes, ears mouth and genitals (private parts).                       Wash face,  Genitals (private parts) with your normal soap.             6.  Wash thoroughly, paying special attention to the area where your surgery  will be performed.  7.  Thoroughly rinse your body with warm water from the neck down.  8.  DO NOT shower/wash with your normal soap after using and rinsing off  the CHG Soap.                9.  Pat yourself dry with a clean towel.            10.  Wear clean pajamas.            11.  Place clean sheets on your bed the night of your first shower and do not  sleep with pets. Day of Surgery : Do not apply any lotions/deodorants the morning of surgery.  Please wear clean clothes to the hospital/surgery center.  FAILURE TO FOLLOW  THESE INSTRUCTIONS MAY RESULT IN THE CANCELLATION OF YOUR SURGERY PATIENT SIGNATURE_________________________________  NURSE SIGNATURE__________________________________  ________________________________________________________________________

## 2023-06-26 ENCOUNTER — Encounter (HOSPITAL_COMMUNITY): Payer: Self-pay

## 2023-06-26 ENCOUNTER — Encounter (HOSPITAL_COMMUNITY)
Admission: RE | Admit: 2023-06-26 | Discharge: 2023-06-26 | Disposition: A | Discharge status: Home / Self Care | Payer: Federal, State, Local not specified - PPO | Source: Ambulatory Visit | Attending: General Surgery | Admitting: General Surgery

## 2023-06-26 ENCOUNTER — Other Ambulatory Visit: Payer: Self-pay

## 2023-06-26 ENCOUNTER — Ambulatory Visit: Payer: Federal, State, Local not specified - PPO | Admitting: Family Medicine

## 2023-06-26 VITALS — BP 124/80 | HR 100 | Ht 74.0 in | Wt 249.0 lb

## 2023-06-26 VITALS — BP 139/100 | HR 82 | Temp 98.2°F | Resp 16 | Ht 74.0 in | Wt 246.0 lb

## 2023-06-26 DIAGNOSIS — Z01818 Encounter for other preprocedural examination: Secondary | ICD-10-CM | POA: Insufficient documentation

## 2023-06-26 DIAGNOSIS — M1711 Unilateral primary osteoarthritis, right knee: Secondary | ICD-10-CM | POA: Diagnosis not present

## 2023-06-26 HISTORY — DX: Anxiety disorder, unspecified: F41.9

## 2023-06-26 HISTORY — DX: Depression, unspecified: F32.A

## 2023-06-26 LAB — BASIC METABOLIC PANEL
Anion gap: 8 (ref 5–15)
BUN: 13 mg/dL (ref 8–23)
CO2: 23 mmol/L (ref 22–32)
Calcium: 8.7 mg/dL — ABNORMAL LOW (ref 8.9–10.3)
Chloride: 105 mmol/L (ref 98–111)
Creatinine, Ser: 1.09 mg/dL (ref 0.61–1.24)
GFR, Estimated: >60 mL/min (ref >60)
Glucose, Bld: 116 mg/dL — ABNORMAL HIGH (ref 70–99)
Potassium: 3.9 mmol/L (ref 3.5–5.1)
Sodium: 136 mmol/L (ref 135–145)

## 2023-06-26 LAB — CBC
HCT: 44.1 % (ref 39.0–52.0)
Hemoglobin: 14.3 g/dL (ref 13.0–17.0)
MCH: 26 pg (ref 26.0–34.0)
MCHC: 32.4 g/dL (ref 30.0–36.0)
MCV: 80.3 fL (ref 80.0–100.0)
Platelets: 206 10*3/uL (ref 150–400)
RBC: 5.49 MIL/uL (ref 4.22–5.81)
RDW: 15.4 % (ref 11.5–15.5)
WBC: 7.5 10*3/uL (ref 4.0–10.5)
nRBC: 0 % (ref 0.0–0.2)

## 2023-06-26 NOTE — Progress Notes (Signed)
[facility] [address] [place] [address] Phone: [phone] Subjective:   [facility], am serving as a scribe for Dr. Antoine Primas.  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  Patient still wants to avoid any surgical intervention.  Wants to continue with the conservative therapy.  Feels like he can hold on any type of injection today.  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  Discussed icing regimen and home exercises.  Follow-up again in 8-12 weeks.   Updated 06/26/2023 [facility] is a 62 y.o. male coming in with complaint of R knee pain. Doing a little better.  Patient feels like nothing that is stopping him from true activity.  And sometimes does have some increasing instability noted.  Patient denies any fevers, chills, any abnormal weight loss.       Past Medical History:  Diagnosis Date   Anxiety    Cancer [facility])    prostate  2021   Cataract    Depression    GERD (gastroesophageal reflux disease)    Glaucoma    Hypertension    Past Surgical History:  Procedure Laterality Date   COLONOSCOPY  2011   EYE SURGERY     hole repair left eye and detached retina surgery   HERNIA REPAIR     umbilical with mesh   LAPAROSCOPIC LYSIS OF ADHESIONS N/A 03/06/2020   Procedure: LAPAROSCOPIC LYSIS OF ADHESIONS;  Surgeon: Crista Elliot, MD;  Location: [facility];  Service: Urology;  Laterality: N/A;   PELVIC LYMPH NODE DISSECTION Bilateral 03/06/2020   Procedure: BILATERAL PELVIC LYMPH NODE DISSECTION;  Surgeon: Crista Elliot, MD;  Location: [facility];  Service: Urology;  Laterality: Bilateral;   ROBOT ASSISTED LAPAROSCOPIC RADICAL PROSTATECTOMY N/A 03/06/2020   Procedure: XI ROBOTIC ASSISTED LAPAROSCOPIC RADICAL PROSTATECTOMY;  Surgeon: Crista Elliot, MD;  Location: [facility];  Service: Urology;   Laterality: N/A;   TONSILLECTOMY     Social History   Socioeconomic History   Marital status: Married    Spouse name: Not on file   Number of children: Not on file   Years of education: Not on file   Highest education level: Not on file  Occupational History   Not on file  Tobacco Use   Smoking status: Former    Current packs/day: 0.00    Types: Cigarettes    Start date: 08/12/1968    Quit date: 08/12/1998    Years since quitting: 24.8   Smokeless tobacco: Never  Vaping Use   Vaping status: Never Used  Substance and Sexual Activity   Alcohol use: Yes    Comment: occasional   Drug use: No   Sexual activity: Yes    Birth control/protection: None  Other Topics Concern   Not on file  Social History Narrative   Not on file   Social Determinants of Health   Financial Resource Strain: Not on file  Food Insecurity: Not on file  Transportation Needs: Not on file  Physical Activity: Not on file  Stress: Not on file  Social Connections: Not on file   No Known Allergies Family History  Problem Relation Age of Onset   Colon cancer Neg Hx    Colon polyps Neg Hx    Esophageal cancer Neg Hx    Rectal cancer Neg Hx  Stomach cancer Neg Hx      Current Outpatient Medications (Cardiovascular):    amLODipine (NORVASC) 5 MG tablet, TAKE 1 TABLET (5 MG TOTAL) BY MOUTH DAILY.   lisinopril (ZESTRIL) 20 MG tablet, TAKE 1 TABLET BY MOUTH EVERY DAY    Current Outpatient Medications (Hematological):    Cyanocobalamin (B-12) 2500 MCG TABS, Take 2,500 mcg by mouth daily.   Iron, Ferrous Sulfate, 325 (65 Fe) MG TABS, Take 325 mg by mouth every other day.  Current Outpatient Medications (Other):    buPROPion (WELLBUTRIN XL) 150 MG 24 hr tablet, Take 1 tablet (150 mg total) by mouth in the morning and at bedtime.   Cholecalciferol (VITAMIN D) 50 MCG (2000 UT) tablet, Take 2,000 Units by mouth daily.   cyclobenzaprine (FLEXERIL) 10 MG tablet, Take 10 mg by mouth 3 (three) times daily as  needed for muscle spasms.   Magnesium 250 MG CAPS, Take 250 mg by mouth daily.   Multiple Vitamin (MULTIVITAMIN WITH MINERALS) TABS tablet, Take 1 tablet by mouth daily.   Omega 3-6-9 Fatty Acids (TRIPLE OMEGA COMPLEX PO), Take 1 capsule by mouth daily.   zinc gluconate 50 MG tablet, Take 50 mg by mouth daily.   Reviewed prior external information including notes and imaging from  primary care provider As well as notes that were available from care everywhere and other healthcare systems.  Past medical history, social, surgical and family history all reviewed in electronic medical record.  No pertanent information unless stated regarding to the chief complaint.   Review of Systems:  No headache, visual changes, nausea, vomiting, diarrhea, constipation, dizziness, abdominal pain, skin rash, fevers, chills, night sweats, weight loss, swollen lymph nodes, body aches, chest pain, shortness of breath, mood changes. POSITIVE muscle aches, joint swelling  Objective  Blood pressure 124/80, pulse 100, height 6\' 2"  (1.88 m), weight 249 lb (112.9 kg), SpO2 96%.   General: No apparent distress alert and oriented x3 mood and affect normal, dressed appropriately.  HEENT: Pupils equal, extraocular movements intact  Respiratory: Patient's speak in full sentences and does not appear short of breath  Cardiovascular: No lower extremity edema, non tender, no erythema  Right hand does have some tenderness over the lateral compartment noted.  Instability noted with valgus and varus force.  Crepitus noted.  Trace effusion noted mostly of the patellofemoral joint    Impression and Recommendations:     The above documentation has been reviewed and is accurate and complete Judi Saa, DO

## 2023-06-29 NOTE — Anesthesia Preprocedure Evaluation (Signed)
Anesthesia Evaluation  Patient identified by MRN, date of birth, ID band Patient awake    Reviewed: Allergy & Precautions, NPO status , Patient's Chart, lab work & pertinent test results  Airway Mallampati: II  TM Distance: >3 FB Neck ROM: Full    Dental  (+) Teeth Intact, Dental Advisory Given, Caps, Missing,    Pulmonary former smoker   Pulmonary exam normal        Cardiovascular hypertension, Pt. on medications Normal cardiovascular exam     Neuro/Psych  PSYCHIATRIC DISORDERS Anxiety Depression    negative neurological ROS     GI/Hepatic Neg liver ROS,GERD  ,,  Endo/Other  negative endocrine ROS    Renal/GU negative Renal ROS  negative genitourinary   Musculoskeletal  (+) Arthritis ,    Abdominal  (+) + obese  Peds  Hematology negative hematology ROS (+)   Anesthesia Other Findings   Reproductive/Obstetrics                             Anesthesia Physical Anesthesia Plan  ASA: 2  Anesthesia Plan: General   Post-op Pain Management:    Induction: Intravenous  PONV Risk Score and Plan: 3 and Ondansetron, Dexamethasone and Midazolam  Airway Management Planned: Oral ETT  Additional Equipment: None  Intra-op Plan:   Post-operative Plan: Extubation in OR  Informed Consent: I have reviewed the patients History and Physical, chart, labs and discussed the procedure including the risks, benefits and alternatives for the proposed anesthesia with the patient or authorized representative who has indicated his/her understanding and acceptance.     Dental advisory given  Plan Discussed with: CRNA  Anesthesia Plan Comments: (2 IV's)        Anesthesia Quick Evaluation

## 2023-06-29 NOTE — Assessment & Plan Note (Signed)
Significant lateral compartment arthritis.  Doing relatively well though with conservative therapy.  Still affecting some daily activities.  We do have bracing noted.  Sometimes does have flares from time to time, sometimes does have evidence instability.  Patient still wants to hold on any type of surgical intervention.  Follow-up again in 3 to 4 months.

## 2023-06-30 ENCOUNTER — Encounter (HOSPITAL_COMMUNITY)
Admission: AD | Disposition: A | Discharge status: Home / Self Care | Payer: Self-pay | Source: Home / Self Care | Attending: General Surgery

## 2023-06-30 ENCOUNTER — Inpatient Hospital Stay: Admit: 2023-06-30 | Payer: Federal, State, Local not specified - PPO | Admitting: General Surgery

## 2023-06-30 ENCOUNTER — Other Ambulatory Visit: Payer: Self-pay

## 2023-06-30 ENCOUNTER — Ambulatory Visit (HOSPITAL_COMMUNITY): Payer: Federal, State, Local not specified - PPO | Admitting: Physician Assistant

## 2023-06-30 ENCOUNTER — Encounter (HOSPITAL_COMMUNITY): Payer: Self-pay | Admitting: General Surgery

## 2023-06-30 ENCOUNTER — Inpatient Hospital Stay (HOSPITAL_COMMUNITY)
Admission: AD | Admit: 2023-06-30 | Discharge: 2023-07-06 | DRG: 336 | Disposition: A | Discharge status: Home / Self Care | Payer: Federal, State, Local not specified - PPO | Attending: General Surgery | Admitting: General Surgery

## 2023-06-30 ENCOUNTER — Ambulatory Visit (HOSPITAL_COMMUNITY): Payer: Self-pay | Admitting: Anesthesiology

## 2023-06-30 DIAGNOSIS — K432 Incisional hernia without obstruction or gangrene: Secondary | ICD-10-CM | POA: Diagnosis not present

## 2023-06-30 DIAGNOSIS — Z87891 Personal history of nicotine dependence: Secondary | ICD-10-CM

## 2023-06-30 DIAGNOSIS — D62 Acute posthemorrhagic anemia: Secondary | ICD-10-CM | POA: Diagnosis not present

## 2023-06-30 DIAGNOSIS — Z6831 Body mass index (BMI) 31.0-31.9, adult: Secondary | ICD-10-CM | POA: Diagnosis not present

## 2023-06-30 DIAGNOSIS — E669 Obesity, unspecified: Secondary | ICD-10-CM | POA: Diagnosis not present

## 2023-06-30 DIAGNOSIS — I10 Essential (primary) hypertension: Secondary | ICD-10-CM | POA: Diagnosis present

## 2023-06-30 DIAGNOSIS — Z01818 Encounter for other preprocedural examination: Secondary | ICD-10-CM

## 2023-06-30 DIAGNOSIS — K91870 Postprocedural hematoma of a digestive system organ or structure following a digestive system procedure: Secondary | ICD-10-CM | POA: Diagnosis not present

## 2023-06-30 DIAGNOSIS — Z79899 Other long term (current) drug therapy: Secondary | ICD-10-CM | POA: Diagnosis not present

## 2023-06-30 DIAGNOSIS — K660 Peritoneal adhesions (postprocedural) (postinfection): Secondary | ICD-10-CM | POA: Diagnosis present

## 2023-06-30 DIAGNOSIS — Y838 Other surgical procedures as the cause of abnormal reaction of the patient, or of later complication, without mention of misadventure at the time of the procedure: Secondary | ICD-10-CM | POA: Diagnosis not present

## 2023-06-30 DIAGNOSIS — Z9079 Acquired absence of other genital organ(s): Secondary | ICD-10-CM | POA: Diagnosis not present

## 2023-06-30 DIAGNOSIS — K219 Gastro-esophageal reflux disease without esophagitis: Secondary | ICD-10-CM | POA: Diagnosis not present

## 2023-06-30 HISTORY — PX: INCISIONAL HERNIA REPAIR: SHX193

## 2023-06-30 LAB — CBC
HCT: 42 *Deleted (ref 39.0–52.0)
Hemoglobin: 13.4 g/dL (ref 13.0–17.0)
MCH: 25.6 pg — ABNORMAL LOW (ref 26.0–34.0)
MCHC: 31.9 g/dL (ref 30.0–36.0)
MCV: 80.3 fL (ref 80.0–100.0)
Platelets: 210 10*3/uL (ref 150–400)
RBC: 5.23 MIL/uL (ref 4.22–5.81)
RDW: 15.5 *Deleted (ref 11.5–15.5)
WBC: 19.9 10*3/uL — ABNORMAL HIGH (ref 4.0–10.5)
nRBC: 0 *Deleted (ref 0.0–0.2)

## 2023-06-30 LAB — HIV ANTIBODY (ROUTINE TESTING W REFLEX): HIV Screen 4th Generation wRfx: NONREACTIVE

## 2023-06-30 LAB — CREATININE, SERUM
Creatinine, Ser: 1.07 mg/dL (ref 0.61–1.24)
GFR, Estimated: >60 mL/min (ref >60)

## 2023-06-30 SURGERY — REPAIR, HERNIA, INCISIONAL
Anesthesia: General

## 2023-06-30 SURGERY — Surgical Case
Anesthesia: *Unknown

## 2023-06-30 SURGERY — REPAIR, HERNIA, VENTRAL, ROBOT-ASSISTED
Anesthesia: General

## 2023-06-30 MED ORDER — EPHEDRINE 5 MG/ML INJ
INTRAVENOUS | Status: AC
  Filled 2023-06-30: qty 5

## 2023-06-30 MED ORDER — CEFAZOLIN SODIUM-DEXTROSE 2-4 GM/100ML-% IV SOLN
2.0000 g | INTRAVENOUS | Status: AC
  Administered 2023-06-30: 2 g via INTRAVENOUS
  Filled 2023-06-30: qty 100

## 2023-06-30 MED ORDER — ORAL CARE MOUTH RINSE: 15.0000 mL | Freq: Once | OROMUCOSAL | Status: AC

## 2023-06-30 MED ORDER — ACETAMINOPHEN 500 MG PO TABS
1000.0000 mg | ORAL_TABLET | ORAL | Status: AC
  Administered 2023-06-30: 1000 mg via ORAL
  Filled 2023-06-30: qty 2

## 2023-06-30 MED ORDER — HYDROMORPHONE HCL 2 MG/ML IJ SOLN
INTRAMUSCULAR | Status: AC
  Filled 2023-06-30: qty 1

## 2023-06-30 MED ORDER — FENTANYL CITRATE (PF) 100 MCG/2ML IJ SOLN
INTRAMUSCULAR | Status: AC
  Filled 2023-06-30: qty 2

## 2023-06-30 MED ORDER — LIDOCAINE 2% (20 MG/ML) 5 ML SYRINGE
INTRAMUSCULAR | Status: DC | PRN
  Administered 2023-06-30: 1.5 mg/kg/h via INTRAVENOUS
  Administered 2023-06-30: 100 mg via INTRAVENOUS

## 2023-06-30 MED ORDER — ENOXAPARIN SODIUM 40 MG/0.4ML IJ SOSY
40.0000 mg | PREFILLED_SYRINGE | Freq: Once | INTRAMUSCULAR | Status: AC
  Administered 2023-06-30: 40 mg via SUBCUTANEOUS
  Filled 2023-06-30: qty 0.4

## 2023-06-30 MED ORDER — DIPHENHYDRAMINE HCL 50 MG/ML IJ SOLN
12.5000 mg | Freq: Four times a day (QID) | INTRAMUSCULAR | Status: DC | PRN
Start: 2023-06-30 — End: 2023-07-02

## 2023-06-30 MED ORDER — MIDAZOLAM HCL 2 MG/2ML IJ SOLN
INTRAMUSCULAR | Status: AC
Start: 2023-06-30 — End: ?
  Filled 2023-06-30: qty 2

## 2023-06-30 MED ORDER — ONDANSETRON HCL 4 MG/2ML IJ SOLN
INTRAMUSCULAR | Status: AC
  Filled 2023-06-30: qty 2

## 2023-06-30 MED ORDER — CHLORHEXIDINE GLUCONATE CLOTH 2 % EX PADS: 6.0000 | MEDICATED_PAD | Freq: Once | CUTANEOUS | Status: DC

## 2023-06-30 MED ORDER — ROCURONIUM BROMIDE 10 MG/ML (PF) SYRINGE
PREFILLED_SYRINGE | INTRAVENOUS | Status: DC | PRN
  Administered 2023-06-30: 70 mg via INTRAVENOUS
  Administered 2023-06-30 (×3): 20 mg via INTRAVENOUS

## 2023-06-30 MED ORDER — SODIUM CHLORIDE 0.9% FLUSH: 9.0000 mL | INTRAVENOUS | Status: DC | PRN

## 2023-06-30 MED ORDER — ONDANSETRON 4 MG PO TBDP: 4.0000 mg | ORAL_TABLET | Freq: Four times a day (QID) | ORAL | Status: DC | PRN

## 2023-06-30 MED ORDER — MEPERIDINE HCL 50 MG/ML IJ SOLN: 6.2500 mg | INTRAMUSCULAR | Status: DC | PRN

## 2023-06-30 MED ORDER — BUPROPION HCL ER (XL) 150 MG PO TB24
150.0000 mg | ORAL_TABLET | Freq: Two times a day (BID) | ORAL | Status: DC
  Administered 2023-07-01 – 2023-07-06 (×7): 150 mg via ORAL
  Filled 2023-06-30 (×9): qty 1

## 2023-06-30 MED ORDER — LACTATED RINGERS IV SOLN: INTRAVENOUS | Status: DC

## 2023-06-30 MED ORDER — LACTATED RINGERS IV SOLN
INTRAVENOUS | Status: AC
  Administered 2023-06-30: 1000 mL via INTRAVENOUS

## 2023-06-30 MED ORDER — EPHEDRINE SULFATE-NACL 50-0.9 MG/10ML-% IV SOSY
PREFILLED_SYRINGE | INTRAVENOUS | Status: DC | PRN
  Administered 2023-06-30 (×3): 5 mg via INTRAVENOUS

## 2023-06-30 MED ORDER — SODIUM CHLORIDE 0.9 % IV SOLN: 12.5000 mg | INTRAVENOUS | Status: DC | PRN

## 2023-06-30 MED ORDER — PROPOFOL 10 MG/ML IV BOLUS
INTRAVENOUS | Status: DC | PRN
  Administered 2023-06-30: 200 mg via INTRAVENOUS

## 2023-06-30 MED ORDER — ALBUMIN HUMAN 5 % IV SOLN
INTRAVENOUS | Status: AC
  Filled 2023-06-30: qty 250

## 2023-06-30 MED ORDER — MIDAZOLAM HCL 5 MG/5ML IJ SOLN
INTRAMUSCULAR | Status: DC | PRN
  Administered 2023-06-30: 2 mg via INTRAVENOUS

## 2023-06-30 MED ORDER — SUGAMMADEX SODIUM 200 MG/2ML IV SOLN
INTRAVENOUS | Status: DC | PRN
  Administered 2023-06-30: 200 mg via INTRAVENOUS

## 2023-06-30 MED ORDER — HYDROMORPHONE HCL 1 MG/ML IJ SOLN
INTRAMUSCULAR | Status: AC
  Administered 2023-06-30: 0.25 mg via INTRAVENOUS
  Filled 2023-06-30: qty 1

## 2023-06-30 MED ORDER — PHENYLEPHRINE 80 MCG/ML (10ML) SYRINGE FOR IV PUSH (FOR BLOOD PRESSURE SUPPORT)
PREFILLED_SYRINGE | INTRAVENOUS | Status: DC | PRN
  Administered 2023-06-30: 80 ug via INTRAVENOUS

## 2023-06-30 MED ORDER — PROPOFOL 10 MG/ML IV BOLUS
INTRAVENOUS | Status: AC
  Filled 2023-06-30: qty 20

## 2023-06-30 MED ORDER — DIPHENHYDRAMINE HCL 12.5 MG/5ML PO ELIX: 12.5000 mg | ORAL_SOLUTION | Freq: Four times a day (QID) | ORAL | Status: DC | PRN

## 2023-06-30 MED ORDER — ROCURONIUM BROMIDE 10 MG/ML (PF) SYRINGE
PREFILLED_SYRINGE | INTRAVENOUS | Status: AC
  Filled 2023-06-30: qty 10

## 2023-06-30 MED ORDER — ENOXAPARIN SODIUM 40 MG/0.4ML IJ SOSY
40.0000 mg | PREFILLED_SYRINGE | INTRAMUSCULAR | Status: DC
  Administered 2023-07-01: 40 mg via SUBCUTANEOUS
  Filled 2023-06-30: qty 0.4

## 2023-06-30 MED ORDER — ONDANSETRON HCL 4 MG/2ML IJ SOLN: 4.0000 mg | Freq: Four times a day (QID) | INTRAMUSCULAR | Status: DC | PRN

## 2023-06-30 MED ORDER — IBUPROFEN 400 MG PO TABS
600.0000 mg | ORAL_TABLET | Freq: Four times a day (QID) | ORAL | Status: DC
  Administered 2023-06-30 – 2023-07-06 (×21): 600 mg via ORAL
  Filled 2023-06-30: qty 1
  Filled 2023-06-30: qty 3
  Filled 2023-06-30: qty 1
  Filled 2023-06-30: qty 3
  Filled 2023-06-30 (×3): qty 1
  Filled 2023-06-30 (×2): qty 3
  Filled 2023-06-30 (×2): qty 1
  Filled 2023-06-30: qty 3
  Filled 2023-06-30 (×5): qty 1
  Filled 2023-06-30: qty 3
  Filled 2023-06-30: qty 1
  Filled 2023-06-30 (×2): qty 3

## 2023-06-30 MED ORDER — HYDROMORPHONE HCL 1 MG/ML IJ SOLN
INTRAMUSCULAR | Status: DC | PRN
  Administered 2023-06-30 (×5): .2 mg via INTRAVENOUS

## 2023-06-30 MED ORDER — ACETAMINOPHEN 500 MG PO TABS
1000.0000 mg | ORAL_TABLET | Freq: Four times a day (QID) | ORAL | Status: DC
  Administered 2023-06-30 – 2023-07-06 (×19): 1000 mg via ORAL
  Filled 2023-06-30 (×20): qty 2

## 2023-06-30 MED ORDER — LIDOCAINE HCL (PF) 2 % IJ SOLN
INTRAMUSCULAR | Status: AC
  Filled 2023-06-30: qty 5

## 2023-06-30 MED ORDER — FENTANYL CITRATE (PF) 100 MCG/2ML IJ SOLN
INTRAMUSCULAR | Status: DC | PRN
  Administered 2023-06-30: 25 ug via INTRAVENOUS
  Administered 2023-06-30: 100 ug via INTRAVENOUS
  Administered 2023-06-30: 50 ug via INTRAVENOUS
  Administered 2023-06-30: 25 ug via INTRAVENOUS

## 2023-06-30 MED ORDER — ALBUMIN HUMAN 5 % IV SOLN: INTRAVENOUS | Status: DC | PRN

## 2023-06-30 MED ORDER — DEXAMETHASONE SODIUM PHOSPHATE 10 MG/ML IJ SOLN
INTRAMUSCULAR | Status: DC | PRN
  Administered 2023-06-30: 10 mg via INTRAVENOUS

## 2023-06-30 MED ORDER — OXYCODONE HCL 5 MG PO TABS
5.0000 mg | ORAL_TABLET | ORAL | Status: DC | PRN
  Administered 2023-07-02 – 2023-07-03 (×2): 10 mg via ORAL
  Filled 2023-06-30 (×3): qty 2

## 2023-06-30 MED ORDER — HYDROMORPHONE 1 MG/ML IV SOLN
INTRAVENOUS | Status: DC
  Administered 2023-06-30: 1.8 mg via INTRAVENOUS
  Administered 2023-06-30: 0.9 mg via INTRAVENOUS
  Administered 2023-06-30: 30 mg via INTRAVENOUS
  Administered 2023-07-01: 1.2 mg via INTRAVENOUS
  Administered 2023-07-01: 1.5 mg via INTRAVENOUS
  Administered 2023-07-01: 0.6 mg via INTRAVENOUS
  Administered 2023-07-01 – 2023-07-02 (×2): 0.9 mg via INTRAVENOUS
  Filled 2023-06-30: qty 30

## 2023-06-30 MED ORDER — GABAPENTIN 300 MG PO CAPS
300.0000 mg | ORAL_CAPSULE | ORAL | Status: AC
  Administered 2023-06-30: 300 mg via ORAL
  Filled 2023-06-30: qty 1

## 2023-06-30 MED ORDER — STERILE WATER FOR IRRIGATION IR SOLN
Status: AC | PRN
  Administered 2023-06-30: 500 mL/h

## 2023-06-30 MED ORDER — NALOXONE HCL 0.4 MG/ML IJ SOLN: 0.4000 mg | INTRAMUSCULAR | Status: DC | PRN

## 2023-06-30 MED ORDER — CHLORHEXIDINE GLUCONATE 0.12 % MT SOLN
15.0000 mL | Freq: Once | OROMUCOSAL | Status: AC
  Administered 2023-06-30: 15 mL via OROMUCOSAL

## 2023-06-30 MED ORDER — KETOROLAC TROMETHAMINE 30 MG/ML IJ SOLN: 30.0000 mg | Freq: Once | INTRAMUSCULAR | Status: DC | PRN

## 2023-06-30 MED ORDER — 0.9 % SODIUM CHLORIDE (POUR BTL) OPTIME
TOPICAL | Status: DC | PRN
Start: 2023-06-30 — End: 2023-06-30
  Administered 2023-06-30: 1000 mL

## 2023-06-30 MED ORDER — HYDROMORPHONE HCL 1 MG/ML IJ SOLN
0.2500 mg | INTRAMUSCULAR | Status: DC | PRN
  Administered 2023-06-30: 0.5 mg via INTRAVENOUS
  Administered 2023-06-30: 0.25 mg via INTRAVENOUS

## 2023-06-30 MED ORDER — ONDANSETRON HCL 4 MG/2ML IJ SOLN
INTRAMUSCULAR | Status: DC | PRN
  Administered 2023-06-30: 4 mg via INTRAVENOUS

## 2023-06-30 MED ORDER — SIMETHICONE 80 MG PO CHEW
80.0000 mg | CHEWABLE_TABLET | Freq: Four times a day (QID) | ORAL | Status: DC | PRN
  Administered 2023-06-30 – 2023-07-06 (×7): 80 mg via ORAL
  Filled 2023-06-30 (×7): qty 1

## 2023-06-30 MED ORDER — PHENYLEPHRINE 80 MCG/ML (10ML) SYRINGE FOR IV PUSH (FOR BLOOD PRESSURE SUPPORT)
PREFILLED_SYRINGE | INTRAVENOUS | Status: AC
  Filled 2023-06-30: qty 10

## 2023-06-30 MED ORDER — DEXAMETHASONE SODIUM PHOSPHATE 10 MG/ML IJ SOLN
INTRAMUSCULAR | Status: AC
  Filled 2023-06-30: qty 1

## 2023-06-30 SURGICAL SUPPLY — 32 items
BAG COUNTER SPONGE SURGICOUNT (BAG) IMPLANT
BINDER ABDOMINAL 12 ML 46-62 (SOFTGOODS) IMPLANT
CHLORAPREP W/TINT 26 (MISCELLANEOUS) ×1 IMPLANT
COVER SURGICAL LIGHT HANDLE (MISCELLANEOUS) ×1 IMPLANT
DERMABOND ADVANCED .7 DNX12 (GAUZE/BANDAGES/DRESSINGS) IMPLANT
DRAPE LAPAROSCOPIC ABDOMINAL (DRAPES) ×1 IMPLANT
ELECT REM PT RETURN 15FT ADLT (MISCELLANEOUS) ×1 IMPLANT
GAUZE SPONGE 4X4 12PLY STRL (GAUZE/BANDAGES/DRESSINGS) ×1 IMPLANT
GLOVE SURG ORTHO 8.0 STRL STRW (GLOVE) ×1 IMPLANT
GLOVE SURG SYN 7.5 E (GLOVE) ×1
GLOVE SURG SYN 7.5 PF PI (GLOVE) ×1 IMPLANT
GOWN STRL REUS W/ TWL XL LVL3 (GOWN DISPOSABLE) ×2 IMPLANT
GOWN STRL REUS W/TWL XL LVL3 (GOWN DISPOSABLE) ×2
KIT BASIN OR (CUSTOM PROCEDURE TRAY) ×1 IMPLANT
KIT TURNOVER KIT A (KITS) IMPLANT
MESH SOFT 12X12IN BARD (Mesh General) IMPLANT
NS IRRIG 1000ML POUR BTL (IV SOLUTION) ×1 IMPLANT
PACK GENERAL/GYN (CUSTOM PROCEDURE TRAY) ×1 IMPLANT
SPONGE DRAIN TRACH 4X4 STRL 2S (GAUZE/BANDAGES/DRESSINGS) IMPLANT
SUT ETHILON 2 0 PS N (SUTURE) IMPLANT
SUT MNCRL AB 4-0 PS2 18 (SUTURE) IMPLANT
SUT NOVA 1 T20/GS 25DT (SUTURE) ×2 IMPLANT
SUT NOVA NAB GS-21 0 18 T12 DT (SUTURE) IMPLANT
SUT PDS AB 2-0 CT2 27 (SUTURE) IMPLANT
SUT SILK 2 0 (SUTURE) ×1
SUT SILK 2-0 18XBRD TIE 12 (SUTURE) IMPLANT
SUT SILK 3 0 SH CR/8 (SUTURE) IMPLANT
SUT STRAFIX PDS 18 CTX (SUTURE) IMPLANT
SUT VIC AB 2-0 CT2 27 (SUTURE) ×1 IMPLANT
SUT VIC AB 3-0 SH 18 (SUTURE) IMPLANT
TOWEL OR 17X26 10 PK STRL BLUE (TOWEL DISPOSABLE) ×1 IMPLANT
TRAY FOLEY MTR SLVR 16FR STAT (SET/KITS/TRAYS/PACK) IMPLANT

## 2023-06-30 NOTE — H&P (Signed)
[facility] [birth year]  [id number].    HPI:  62 y/o M w/ a hx of prostatectomy and prior umbilical hernia repair with mesh who presents for elective open ventral hernia repair. He is in his usual state of health and denies any recent hospitalizations or changes in medication.  ROS: Review of Systems  Constitutional: Negative.   HENT: Negative.    Eyes: Negative.   Respiratory: Negative.    Cardiovascular: Negative.   Gastrointestinal: Negative.   Genitourinary: Negative.   Musculoskeletal: Negative.   Skin: Negative.   Neurological: Negative.   Endo/Heme/Allergies: Negative.   Psychiatric/Behavioral: Negative.      Family History  Problem Relation Age of Onset   Colon cancer Neg Hx    Colon polyps Neg Hx    Esophageal cancer Neg Hx    Rectal cancer Neg Hx    Stomach cancer Neg Hx     Past Medical History:  Diagnosis Date   Anxiety    Cancer (HCC)    prostate  2021   Cataract    Depression    GERD (gastroesophageal reflux disease)    Glaucoma    Hypertension     Past Surgical History:  Procedure Laterality Date   COLONOSCOPY  2011   EYE SURGERY     hole repair left eye and detached retina surgery   HERNIA REPAIR     umbilical with mesh   LAPAROSCOPIC LYSIS OF ADHESIONS N/A 03/06/2020   Procedure: LAPAROSCOPIC LYSIS OF ADHESIONS;  Surgeon: Crista Elliot, MD;  Location: [facility];  Service: Urology;  Laterality: N/A;   PELVIC LYMPH NODE DISSECTION Bilateral 03/06/2020   Procedure: BILATERAL PELVIC LYMPH NODE DISSECTION;  Surgeon: Crista Elliot, MD;  Location: [facility];  Service: Urology;  Laterality: Bilateral;   ROBOT ASSISTED LAPAROSCOPIC RADICAL PROSTATECTOMY N/A 03/06/2020   Procedure: XI ROBOTIC ASSISTED LAPAROSCOPIC RADICAL PROSTATECTOMY;  Surgeon: Crista Elliot, MD;  Location: [facility];  Service: Urology;  Laterality: N/A;   TONSILLECTOMY      Social History:  reports that he quit smoking about 24 years ago. His smoking use included  cigarettes. He started smoking about 54 years ago. He has never used smokeless tobacco. He reports current alcohol use. He reports that he does not use drugs.  Allergies: No Known Allergies  Medications Prior to Admission  Medication Sig Dispense Refill   amLODipine (NORVASC) 5 MG tablet TAKE 1 TABLET (5 MG TOTAL) BY MOUTH DAILY. 90 tablet 1   buPROPion (WELLBUTRIN XL) 150 MG 24 hr tablet Take 1 tablet (150 mg total) by mouth in the morning and at bedtime. 180 tablet 1   Cholecalciferol (VITAMIN D) 50 MCG (2000 UT) tablet Take 2,000 Units by mouth daily.     Cyanocobalamin (B-12) 2500 MCG TABS Take 2,500 mcg by mouth daily.     cyclobenzaprine (FLEXERIL) 10 MG tablet Take 10 mg by mouth 3 (three) times daily as needed for muscle spasms.     Iron, Ferrous Sulfate, 325 (65 Fe) MG TABS Take 325 mg by mouth every other day. 45 tablet 3   lisinopril (ZESTRIL) 20 MG tablet TAKE 1 TABLET BY MOUTH EVERY DAY 90 tablet 3   Magnesium 250 MG CAPS Take 250 mg by mouth daily.     Multiple Vitamin (MULTIVITAMIN WITH MINERALS) TABS tablet Take 1 tablet by mouth daily.     Omega 3-6-9 Fatty Acids (TRIPLE OMEGA COMPLEX PO) Take 1 capsule by mouth daily.  zinc gluconate 50 MG tablet Take 50 mg by mouth daily.      Physical Exam: Blood pressure (!) 141/95, pulse 76, temperature 98 F (36.7 C), temperature source Oral, resp. rate 18, height 6' (1.829 m), weight 112.9 kg, SpO2 97%. Gen: male resting in bed, NAD Abd: soft, non-distended, easily reducible hernia Neuro: moving all extremities   No results found for this or any previous visit (from the past 48 hour(s)). No results found.  Assessment/Plan 62 y/o M presenting for elective open ventral hernia repair with mesh  - We discussed the alternatives and potential risks of surgery, including but not limited to: bleeding, infection, damage to bowel or surrounding structures, chronic pain, mesh complications, hernia recurrence. All questions were  addressed and consent was obtained.   - Will plan for admission after surgery  [facility] 06/30/2023, 7:03 AM Please see Amion for pager number during day hours 7:00am-4:30pm or 7:00am -11:30am on weekends

## 2023-06-30 NOTE — Anesthesia Procedure Notes (Signed)
Procedure Name: Intubation Date/Time: 06/30/2023 7:34 AM  Performed by: Elisabeth Cara, CRNAPre-anesthesia Checklist: Patient identified, Emergency Drugs available, Suction available, Patient being monitored and Timeout performed Patient Re-evaluated:Patient Re-evaluated prior to induction Oxygen Delivery Method: Circle system utilized Preoxygenation: Pre-oxygenation with 100% oxygen Induction Type: IV induction Ventilation: Mask ventilation without difficulty Laryngoscope Size: Mac and 4 Grade View: Grade I Tube type: Oral Tube size: 7.5 mm Number of attempts: 2 Airway Equipment and Method: Stylet Placement Confirmation: ETT inserted through vocal cords under direct vision, positive ETCO2 and breath sounds checked- equal and bilateral Secured at: 22 cm Tube secured with: Tape Dental Injury: Teeth and Oropharynx as per pre-operative assessment

## 2023-06-30 NOTE — Transfer of Care (Signed)
Immediate Anesthesia Transfer of Care Note  Patient: [facility]  Procedure(s) Performed: open incisional hernia repair with mesh  Patient Location: PACU  Anesthesia Type:General  Level of Consciousness: awake, alert , oriented, and patient cooperative  Airway & Oxygen Therapy: Patient Spontanous Breathing and Patient connected to face mask oxygen  Post-op Assessment: Report given to RN, Post -op Vital signs reviewed and stable, and Patient moving all extremities  Post vital signs: Reviewed and stable  Last Vitals:  Vitals Value Taken Time  BP 143/102 06/30/23 1130  Temp    Pulse 89 06/30/23 1132  Resp 12 06/30/23 1132  SpO2 99 % 06/30/23 1132  Vitals shown include unfiled device data.  Last Pain:  Vitals:   06/30/23 0600  TempSrc: Oral  PainSc: 0-No pain      Patients Stated Pain Goal: 4 (06/30/23 0600)  Complications: No notable events documented.

## 2023-06-30 NOTE — Op Note (Addendum)
Post-Op Note/Post-Procedure Note  Patient: Jonathan Edwards MRN: [id number] DOB: 07-18-1961 Sex: male Operation/Procedure Date: 06/30/2023 Surgeons and Role:    * Hillery Hunter, Lucilla Edin, MD - Primary    Darnell Level, MD - Assisting  Preoperative Diagnoses: incisional hernia (8cm x 8cm) Postoperative Diagnoses: incisional hernia (8cm x 8cm)  Procedures: 1. Exploratory laparotomy with lysis of adhesions > 90 minutes 2. Left retrorectus myofascial release (27cm x 7cm, 189 sq centimeters) 3. Right retrorectus myofascial release (27cm x 7cm, 189sq centimeters) 4. Incisional hernia repair with mesh (8cm x 8cm)   Anesthesia: General endotracheal anesthesia Indications: Jonathan Edwards is a 61 y.o. year old male who presents for incisional hernia repair. Preoperatively, I discussed in detail the risks, benefits, alternatives, and potential complications. The patient understands and requests to proceed.  Operative Findings:  Two incisional hernias with fascial defect spanning 8cm (craniocaudal) x 8cm (transverse) 2. total surface area for adjacent tissue transfer: 378 square centimeters  Technique: The patient was positively identified and was taken to the operating room and placed supine on the operating room table. After successful induction of general endotracheal anesthesia, the arms were carefully padded. A Foley catheter was placed. The abdomen was widely prepped and draped in the usual sterile surgical fashion. A timeout was performed, confirming correct patient and procedure. We also confirmed initiation of deep venous thrombosis prophylaxis and wound prophylaxis. We began with a midline laparotomy incision using a #10 blade.  We encountered two fascial defects in the midline. We first turned our attention to lysis of adhesions.  The small bowel was adherent to the abdominal wall and to a piece of mesh from a prior surgery. Using a combination of sharp dissection and selective cautery, the  small bowel was freed from the abdominal wall.  The small bowel was inspected, and although there were several areas with signs of trauma related to the dissection there was no injuries requiring repair. The omentum was mobilized out of the hernia sac and away from the abdominal wall. We confirmed no injury to the bowel. We confirmed hemostasis. We soon encountered the extent of the hernia. It measured 8cm in the craniocaudad axis and 8cm in the transverse axis in total extent. The fascia was of good quality bilaterally. The preperitoneal space was entered inferiorly and developed down toward the pubis. Superiorly, we also mobilized the preperitoneal space toward the xiphoid process. We turned our attention to creation of the right sided adjacent tissue transfer via posterior rectus advancement flap to facilitate primary fascial closure via a Rives-Stoppa approach. A moist radiopaque towel was used to gently pack away and protect the bowel. The linea alba was retracted anteriorly and a right 10mm incision was made in the posterior rectus sheath about 10mm lateral to the fascial edge. The belly of the rectus muscle was identified and bluntly separated from the posterior rectus sheath out toward the linea semilunaris. This space was mobilized superiorly toward the costal margin. Inferiorly, the retrorectus space was mobilized toward the pubis taking care to preserve the inferior epigastric vessels and the lateral neurovascular bundles.We found that we had about a 7cm release on this side, easily bringing the linea alba close to the midline without tension. The left side would not medialize enough toward the midline. We again confirmed hemostasis in the posterior rectus sheath. To facilitate tension-free primary fascial closure, we then decided to creation of a left sided adjacent tissue transfer via posterior rectus advancement flap to facilitate primary fascial closure. This was done by  mobilizing the retrorectus  space. The linea alba was retracted anteriorly and a 10mm incision was made on the posterior rectus sheath about 10mm lateral to the fascial edge. The belly of the rectus muscle was identified and bluntly separated from the posterior rectus sheath out to the linea semilunaris. The space was mobilized superiorly toward the costal margin. Inferiorly, the retrorectus space was mobilized toward the pubis and out to the linea semilunaris, taking care to preserve the inferior epigastric vessels and the neurovascular bundles. We found we had a 7cm release also on this side, bringing the linea alba to midline without tension. At this point, both sides would medialize well for tension-free primary fascial closure. We confirmed hemostasis in these planes. We confirmed all sponge, needle, and instrument counts were reported correct. The total tissue transfer obtained with bilateral myofascial release was 378 sq cm.  The posterior sheath was closed using running 2-0 PDS suture. Before final closure, the radiopaque towel was removed. This excluded the bowel. We then turned our attention to mesh placement. We confirmed hemostasis in the dissection planes. We brought a piece of Bard Softmesh 30cm x 30cm mesh into the retrorectus and preperitoneal spaces placed it flat ensuring no buckling. At this point, the linea alba would medialize without any tension. A 43F Blake drains were placed in the retromuscular space and brought out through the abdominal wall bilaterally. This was sutured to the skin using 2-0 Nylon suture. The linea alba was closed using #1 Stratafix suture superiorly and inferiorly. We confirmed hemostasis in the subcutaneous space. We then closed a deep subcutaneous layer using running 2-0 Vicryl suture. Skin closure was performed using running 4-0 Monocryl subcuticular suture. Dermbond was applied. The drains were placed to closed suction. An abdominal binder was placed. The patient tolerated the procedure well and  was extubated and taken to recovery room in good condition. All sponge, needle, and instrument counts were reported correct at the end of the case.  Estimated Blood Loss: 75mL Specimens: None  Implants:  Implant Name Type Inv. Item Serial No. Manufacturer Lot No. LRB No. Used Action  MESH SOFT 12X12IN BARD - [id number] Mesh General MESH SOFT 12X12IN BARD  [employer] BARD ACCESS [id number] N/A 1 Implanted    Drains: One 43F Blake drains in retromuscular space Complications: * No complications entered in OR log *. Condition of the patient: Good, extubated. Disposition: PACU.

## 2023-07-01 ENCOUNTER — Encounter (HOSPITAL_COMMUNITY): Payer: Self-pay | Admitting: General Surgery

## 2023-07-01 LAB — CBC WITH DIFFERENTIAL/PLATELET
Abs Immature Granulocytes: 0.06 10*3/uL (ref 0.00–0.07)
Basophils Absolute: 0 10*3/uL (ref 0.0–0.1)
Basophils Relative: 0 %
Eosinophils Absolute: 0 10*3/uL (ref 0.0–0.5)
Eosinophils Relative: 0 %
HCT: 34.7 % — ABNORMAL LOW (ref 39.0–52.0)
Hemoglobin: 10.9 g/dL — ABNORMAL LOW (ref 13.0–17.0)
Immature Granulocytes: 1 %
Lymphocytes Relative: 18 %
Lymphs Abs: 2.3 10*3/uL (ref 0.7–4.0)
MCH: 25.3 pg — ABNORMAL LOW (ref 26.0–34.0)
MCHC: 31.4 g/dL (ref 30.0–36.0)
MCV: 80.7 fL (ref 80.0–100.0)
Monocytes Absolute: 1.7 10*3/uL — ABNORMAL HIGH (ref 0.1–1.0)
Monocytes Relative: 13 %
Neutro Abs: 8.5 10*3/uL — ABNORMAL HIGH (ref 1.7–7.7)
Neutrophils Relative %: 68 %
Platelets: 196 10*3/uL (ref 150–400)
RBC: 4.3 MIL/uL (ref 4.22–5.81)
RDW: 15.7 % — ABNORMAL HIGH (ref 11.5–15.5)
WBC: 12.5 10*3/uL — ABNORMAL HIGH (ref 4.0–10.5)
nRBC: 0 % (ref 0.0–0.2)

## 2023-07-01 LAB — BASIC METABOLIC PANEL
Anion gap: 8 (ref 5–15)
BUN: 15 mg/dL (ref 8–23)
CO2: 23 mmol/L (ref 22–32)
Calcium: 8.4 mg/dL — ABNORMAL LOW (ref 8.9–10.3)
Chloride: 103 mmol/L (ref 98–111)
Creatinine, Ser: 0.86 mg/dL (ref 0.61–1.24)
GFR, Estimated: >60 mL/min (ref >60)
Glucose, Bld: 128 mg/dL — ABNORMAL HIGH (ref 70–99)
Potassium: 4.2 mmol/L (ref 3.5–5.1)
Sodium: 134 mmol/L — ABNORMAL LOW (ref 135–145)

## 2023-07-01 LAB — CBC
HCT: 39.3 % (ref 39.0–52.0)
Hemoglobin: 12 g/dL — ABNORMAL LOW (ref 13.0–17.0)
MCH: 25.2 pg — ABNORMAL LOW (ref 26.0–34.0)
MCHC: 30.5 g/dL (ref 30.0–36.0)
MCV: 82.4 fL (ref 80.0–100.0)
Platelets: 208 10*3/uL (ref 150–400)
RBC: 4.77 MIL/uL (ref 4.22–5.81)
RDW: 15.7 % — ABNORMAL HIGH (ref 11.5–15.5)
WBC: 14.4 10*3/uL — ABNORMAL HIGH (ref 4.0–10.5)
nRBC: 0 % (ref 0.0–0.2)

## 2023-07-01 LAB — PHOSPHORUS: Phosphorus: 3.5 mg/dL (ref 2.5–4.6)

## 2023-07-01 LAB — MAGNESIUM: Magnesium: 1.9 mg/dL (ref 1.7–2.4)

## 2023-07-01 NOTE — Plan of Care (Signed)

## 2023-07-01 NOTE — Progress Notes (Signed)
   07/01/23 1414  TOC Brief Assessment  Insurance and Status Reviewed  Patient has primary care physician Yes  Home environment has been reviewed home with spouse  Prior level of function: indpependent  Social Determinants of Health Reivew SDOH reviewed interventions complete  Readmission risk has been reviewed Yes  Transition of care needs no transition of care needs at this time   Met with pt/ wife today to review SDOH concerns for food and utilities.  Wife notes that they have 3 children and are experiencing financial strains.  Pt/ wife agreeable for CSW to placed local support resource information on the AVS.  No further TOC needs.

## 2023-07-01 NOTE — Progress Notes (Signed)
    1 Day Post-Op  Subjective: Reports that his pain is well controlled.  Only using dPCA intermittently. Tolerated CLD without nausea or pain.  No flatus yet.  Drain output remains somewhat bloody.   ROS: See above, otherwise other systems negative  Objective: Vital signs in last 24 hours: Temp:  [97.6 F (36.4 C)-98.8 F (37.1 C)] 98.2 F (36.8 C) (11/19 0624) Pulse Rate:  [85-93] 93 (11/19 0624) Resp:  [10-20] 18 (11/19 0624) BP: (126-164)/(93-105) 126/93 (11/19 0624) SpO2:  [93 %-100 %] 100 % (11/19 0624) Last BM Date : 06/29/23  Intake/Output from previous day: 11/18 0701 - 11/19 0700 In: 4447.9 [P.O.:1080; I.V.:3017.9; IV Piggyback:350] Out: 1940 [Urine:1300; Drains:590; Blood:50] Intake/Output this shift: Total I/O In: 1621.2 [P.O.:720; I.V.:901.2] Out: 840 [Urine:600; Drains:240]  PE: Gen: male resting in bed, NAD Abd: soft, non-distended, appropriately tender, dermabond intact, JP with sanguinous output  Lab Results:  Recent Labs    06/30/23 1145 07/01/23 0450  WBC 19.9* 14.4*  HGB 13.4 12.0*  HCT 42.0 39.3  PLT 210 208   BMET Recent Labs    06/30/23 1145 07/01/23 0450  NA  --  134*  K  --  4.2  CL  --  103  CO2  --  23  GLUCOSE  --  128*  BUN  --  15  CREATININE 1.07 0.86  CALCIUM  --  8.4*   PT/INR No results for input(s): "LABPROT", "INR" in the last 72 hours. CMP     Component Value Date/Time   NA 134 (L) 07/01/2023 0450   K 4.2 07/01/2023 0450   CL 103 07/01/2023 0450   CO2 23 07/01/2023 0450   GLUCOSE 128 (H) 07/01/2023 0450   BUN 15 07/01/2023 0450   CREATININE 0.86 07/01/2023 0450   CALCIUM 8.4 (L) 07/01/2023 0450   PROT 7.5 04/03/2023 2228   ALBUMIN 4.2 04/03/2023 2228   AST 33 04/03/2023 2228   ALT 33 04/03/2023 2228   ALKPHOS 58 04/03/2023 2228   BILITOT 0.7 04/03/2023 2228   GFRNONAA >60 07/01/2023 0450   GFRAA >60 03/14/2020 0543   Lipase     Component Value Date/Time   LIPASE 31 04/03/2023 2228     Studies/Results: No results found.  Anti-infectives: Anti-infectives (From admission, onward)    Start     Dose/Rate Route Frequency Ordered Stop   06/30/23 0600  ceFAZolin (ANCEF) IVPB 2g/100 mL premix        2 g 200 mL/hr over 30 Minutes Intravenous On call to O.R. 06/30/23 2956 06/30/23 0805       Assessment/Plan 62 y/o M POD 1 from ex-lap, LOA, and incisional hernia repair with mesh.  - Continue CLD for now.  Will discuss advancing with ROBF - Continue dPCA. Scheduled Tylenol and Ibuprofen.  PRN Roxicodone - OOB and ambulating in the hall today. Wean oxygen - Monitor drain output. Start DVT ppx  DM (POA) - SSI   LOS: 1 day   Tacy [facility] 07/01/2023, 7:00 AM Please see Amion for pager number during day hours 7:00am-4:30pm or 7:00am -11:30am on weekends

## 2023-07-01 NOTE — Anesthesia Postprocedure Evaluation (Signed)
Anesthesia Post Note  Patient: Jonathan Edwards  Procedure(s) Performed: open incisional hernia repair with mesh     Patient location during evaluation: PACU Anesthesia Type: General Level of consciousness: awake and sedated Pain management: pain level controlled Vital Signs Assessment: post-procedure vital signs reviewed and stable Respiratory status: spontaneous breathing Cardiovascular status: stable Postop Assessment: no apparent nausea or vomiting Anesthetic complications: no  No notable events documented.  Last Vitals:  Vitals:   07/01/23 0624 07/01/23 0742  BP: (!) 126/93   Pulse: 93   Resp: 18 18  Temp: 36.8 C   SpO2: 100% 99%    Last Pain:  Vitals:   07/01/23 0742  TempSrc:   PainSc: 2                  Caren Macadam

## 2023-07-01 NOTE — Progress Notes (Signed)
Mobility Specialist - Progress Note Nurse requested Mobility Specialist to perform oxygen saturation test with pt which includes removing pt from oxygen both at rest and while ambulating.  Below are the results from that testing.     Patient Saturations on Room Air at Rest = spO2 97%  Patient Saturations on Room Air while Ambulating = sp02 95% .  Reported results to nurse.     07/01/23 0954  Mobility  Activity Ambulated with assistance in hallway  Level of Assistance Standby assist, set-up cues, supervision of patient - no hands on  Assistive Device Front wheel walker  Distance Ambulated (ft) 50 ft  Range of Motion/Exercises Active  Activity Response Tolerated well  Mobility Referral Yes  $Mobility charge 1 Mobility  Mobility Specialist Start Time (ACUTE ONLY) 0940  Mobility Specialist Stop Time (ACUTE ONLY) 0954  Mobility Specialist Time Calculation (min) (ACUTE ONLY) 14 min   (RA) Pre-mobility: 113 bpm HR, 97% SpO2 During mobility: 123 bpm HR, 95% SpO2 Post-mobility: 110 bpm HR, 97% SPO2  Pt was found in bed and agreeable to ambulate. Stated feeling lightheaded with session. At EOS returned to recliner chair with all needs met. NT and wife in room.  Billey Chang Mobility Specialist

## 2023-07-02 ENCOUNTER — Inpatient Hospital Stay (HOSPITAL_COMMUNITY): Payer: Federal, State, Local not specified - PPO | Admitting: Anesthesiology

## 2023-07-02 ENCOUNTER — Encounter (HOSPITAL_COMMUNITY): Payer: Self-pay | Admitting: General Surgery

## 2023-07-02 ENCOUNTER — Encounter (HOSPITAL_COMMUNITY)
Admission: AD | Disposition: A | Discharge status: Home / Self Care | Payer: Self-pay | Source: Home / Self Care | Attending: General Surgery

## 2023-07-02 DIAGNOSIS — T819XXA Unspecified complication of procedure, initial encounter: Secondary | ICD-10-CM | POA: Diagnosis not present

## 2023-07-02 DIAGNOSIS — L7622 Postprocedural hemorrhage and hematoma of skin and subcutaneous tissue following other procedure: Secondary | ICD-10-CM | POA: Diagnosis not present

## 2023-07-02 HISTORY — PX: LAPAROTOMY: SHX154

## 2023-07-02 LAB — BASIC METABOLIC PANEL
Anion gap: 7 (ref 5–15)
BUN: 19 mg/dL (ref 8–23)
CO2: 26 mmol/L (ref 22–32)
Calcium: 8.1 mg/dL — ABNORMAL LOW (ref 8.9–10.3)
Chloride: 101 mmol/L (ref 98–111)
Creatinine, Ser: 1.19 mg/dL (ref 0.61–1.24)
GFR, Estimated: >60 mL/min (ref >60)
Glucose, Bld: 108 mg/dL — ABNORMAL HIGH (ref 70–99)
Potassium: 4 mmol/L (ref 3.5–5.1)
Sodium: 134 mmol/L — ABNORMAL LOW (ref 135–145)

## 2023-07-02 LAB — CBC
HCT: 31.2 % — ABNORMAL LOW (ref 39.0–52.0)
Hemoglobin: 9.7 g/dL — ABNORMAL LOW (ref 13.0–17.0)
MCH: 25.4 pg — ABNORMAL LOW (ref 26.0–34.0)
MCHC: 31.1 g/dL (ref 30.0–36.0)
MCV: 81.7 fL (ref 80.0–100.0)
Platelets: 189 10*3/uL (ref 150–400)
RBC: 3.82 MIL/uL — ABNORMAL LOW (ref 4.22–5.81)
RDW: 15.7 % — ABNORMAL HIGH (ref 11.5–15.5)
WBC: 11.7 10*3/uL — ABNORMAL HIGH (ref 4.0–10.5)
nRBC: 0 % (ref 0.0–0.2)

## 2023-07-02 LAB — HEMOGLOBIN AND HEMATOCRIT, BLOOD
HCT: 27.1 % — ABNORMAL LOW (ref 39.0–52.0)
Hemoglobin: 8.9 g/dL — ABNORMAL LOW (ref 13.0–17.0)

## 2023-07-02 LAB — MAGNESIUM: Magnesium: 2.2 mg/dL (ref 1.7–2.4)

## 2023-07-02 SURGERY — LAPAROTOMY, EXPLORATORY
Anesthesia: General | Site: Abdomen

## 2023-07-02 MED ORDER — CEFAZOLIN SODIUM-DEXTROSE 2-3 GM-%(50ML) IV SOLR
INTRAVENOUS | Status: DC | PRN
Start: 2023-07-02 — End: 2023-07-02
  Administered 2023-07-02: 2 g via INTRAVENOUS

## 2023-07-02 MED ORDER — LACTATED RINGERS IV SOLN
INTRAVENOUS | Status: DC
Start: 2023-07-02 — End: 2023-07-02

## 2023-07-02 MED ORDER — ACETAMINOPHEN 10 MG/ML IV SOLN
INTRAVENOUS | Status: DC | PRN
  Administered 2023-07-02: 1000 mg via INTRAVENOUS

## 2023-07-02 MED ORDER — PROPOFOL 10 MG/ML IV BOLUS
INTRAVENOUS | Status: DC | PRN
  Administered 2023-07-02: 200 mg via INTRAVENOUS

## 2023-07-02 MED ORDER — MIDAZOLAM HCL 5 MG/5ML IJ SOLN
INTRAMUSCULAR | Status: DC | PRN
  Administered 2023-07-02: 2 mg via INTRAVENOUS

## 2023-07-02 MED ORDER — SUCCINYLCHOLINE CHLORIDE 200 MG/10ML IV SOSY
PREFILLED_SYRINGE | INTRAVENOUS | Status: DC | PRN
  Administered 2023-07-02: 200 mg via INTRAVENOUS

## 2023-07-02 MED ORDER — FENTANYL CITRATE (PF) 100 MCG/2ML IJ SOLN
INTRAMUSCULAR | Status: AC
  Filled 2023-07-02: qty 2

## 2023-07-02 MED ORDER — HYDROMORPHONE HCL 2 MG/ML IJ SOLN
INTRAMUSCULAR | Status: AC
  Filled 2023-07-02: qty 1

## 2023-07-02 MED ORDER — SURGIFLO WITH THROMBIN (HEMOSTATIC MATRIX KIT) OPTIME
TOPICAL | Status: DC | PRN
  Administered 2023-07-02: 2 via TOPICAL

## 2023-07-02 MED ORDER — 0.9 % SODIUM CHLORIDE (POUR BTL) OPTIME
TOPICAL | Status: DC | PRN
  Administered 2023-07-02: 5000 mL

## 2023-07-02 MED ORDER — ROCURONIUM BROMIDE 100 MG/10ML IV SOLN
INTRAVENOUS | Status: DC | PRN
  Administered 2023-07-02: 50 mg via INTRAVENOUS

## 2023-07-02 MED ORDER — ORAL CARE MOUTH RINSE: 15.0000 mL | Freq: Once | OROMUCOSAL | Status: AC

## 2023-07-02 MED ORDER — LACTATED RINGERS IV SOLN: INTRAVENOUS | Status: DC

## 2023-07-02 MED ORDER — LIDOCAINE HCL (CARDIAC) PF 100 MG/5ML IV SOSY
PREFILLED_SYRINGE | INTRAVENOUS | Status: DC | PRN
  Administered 2023-07-02: 40 mg via INTRAVENOUS

## 2023-07-02 MED ORDER — CEFAZOLIN SODIUM-DEXTROSE 2-4 GM/100ML-% IV SOLN
INTRAVENOUS | Status: AC
  Filled 2023-07-02: qty 100

## 2023-07-02 MED ORDER — CHLORHEXIDINE GLUCONATE 0.12 % MT SOLN
15.0000 mL | Freq: Once | OROMUCOSAL | Status: AC
  Administered 2023-07-02: 15 mL via OROMUCOSAL

## 2023-07-02 MED ORDER — SUGAMMADEX SODIUM 200 MG/2ML IV SOLN
INTRAVENOUS | Status: DC | PRN
Start: 2023-07-02 — End: 2023-07-02
  Administered 2023-07-02: 350 mg via INTRAVENOUS

## 2023-07-02 MED ORDER — HEMOSTATIC AGENTS (NO CHARGE) OPTIME
TOPICAL | Status: DC | PRN
  Administered 2023-07-02: 1 via TOPICAL

## 2023-07-02 MED ORDER — ACETAMINOPHEN 10 MG/ML IV SOLN
INTRAVENOUS | Status: AC
  Filled 2023-07-02: qty 100

## 2023-07-02 MED ORDER — LABETALOL HCL 5 MG/ML IV SOLN
INTRAVENOUS | Status: AC
  Administered 2023-07-02: 5 mg via INTRAVENOUS
  Filled 2023-07-02: qty 4

## 2023-07-02 MED ORDER — PROPOFOL 10 MG/ML IV BOLUS
INTRAVENOUS | Status: AC
  Filled 2023-07-02: qty 20

## 2023-07-02 MED ORDER — LABETALOL HCL 5 MG/ML IV SOLN: 5.0000 mg | INTRAVENOUS | Status: DC | PRN

## 2023-07-02 MED ORDER — MIDAZOLAM HCL 2 MG/2ML IJ SOLN
INTRAMUSCULAR | Status: AC
  Filled 2023-07-02: qty 2

## 2023-07-02 MED ORDER — FENTANYL CITRATE (PF) 100 MCG/2ML IJ SOLN
INTRAMUSCULAR | Status: DC | PRN
  Administered 2023-07-02: 100 ug via INTRAVENOUS

## 2023-07-02 MED ORDER — ONDANSETRON HCL 4 MG/2ML IJ SOLN
INTRAMUSCULAR | Status: DC | PRN
  Administered 2023-07-02: 4 mg via INTRAVENOUS

## 2023-07-02 MED ORDER — DEXAMETHASONE SODIUM PHOSPHATE 10 MG/ML IJ SOLN
INTRAMUSCULAR | Status: DC | PRN
Start: 2023-07-02 — End: 2023-07-02
  Administered 2023-07-02: 10 mg via INTRAVENOUS

## 2023-07-02 MED ORDER — HYDROMORPHONE HCL 1 MG/ML IJ SOLN
INTRAMUSCULAR | Status: DC | PRN
  Administered 2023-07-02 (×2): 1 mg via INTRAVENOUS

## 2023-07-02 SURGICAL SUPPLY — 29 items
BAG COUNTER SPONGE SURGICOUNT (BAG) IMPLANT
CHLORAPREP W/TINT 26 (MISCELLANEOUS) IMPLANT
COVER MAYO STAND STRL (DRAPES) ×1 IMPLANT
COVER SURGICAL LIGHT HANDLE (MISCELLANEOUS) ×1 IMPLANT
DERMABOND ADVANCED .7 DNX12 (GAUZE/BANDAGES/DRESSINGS) IMPLANT
DRAPE LAPAROSCOPIC ABDOMINAL (DRAPES) ×1 IMPLANT
DRSG TEGADERM 4X4.75 (GAUZE/BANDAGES/DRESSINGS) IMPLANT
ELECT REM PT RETURN 15FT ADLT (MISCELLANEOUS) ×1 IMPLANT
GLOVE BIO SURGEON STRL SZ7 (GLOVE) IMPLANT
GLOVE BIOGEL PI IND STRL 8 (GLOVE) ×1 IMPLANT
GLOVE SKINSENSE STRL SZ7.0 (GLOVE) IMPLANT
GLOVE SS BIOGEL STRL SZ 7.5 (GLOVE) ×1 IMPLANT
GOWN STRL REUS W/ TWL XL LVL3 (GOWN DISPOSABLE) ×1 IMPLANT
GOWN STRL REUS W/TWL XL LVL3 (GOWN DISPOSABLE) IMPLANT
HEMOSTAT ARISTA ABSORB 3G PWDR (HEMOSTASIS) IMPLANT
HEMOSTAT SURGICEL 2X14 (HEMOSTASIS) IMPLANT
KIT TURNOVER KIT A (KITS) IMPLANT
PACK GENERAL/GYN (CUSTOM PROCEDURE TRAY) ×1 IMPLANT
SPONGE DRAIN TRACH 4X4 STRL 2S (GAUZE/BANDAGES/DRESSINGS) IMPLANT
SURGIFLO W/THROMBIN 8M KIT (HEMOSTASIS) IMPLANT
SUT ETHILON 2 0 PS N (SUTURE) IMPLANT
SUT MNCRL AB 4-0 PS2 18 (SUTURE) IMPLANT
SUT SILK 2 0 SH CR/8 (SUTURE) IMPLANT
SUT STRAFIX SYMMETRIC 1-0 24 (SUTURE) ×2
SUT VIC AB 3-0 SH 18 (SUTURE) IMPLANT
SUT VIC AB 3-0 SH 27XBRD (SUTURE) IMPLANT
SUT VICRYL 2-0 SH 8X27 (SUTURE) ×1 IMPLANT
SUTURE STRAFIX SYMMETRC 1-0 24 (SUTURE) IMPLANT
TOWEL OR NON WOVEN STRL DISP B (DISPOSABLE) ×1 IMPLANT

## 2023-07-02 NOTE — Transfer of Care (Signed)
Immediate Anesthesia Transfer of Care Note  Patient: Jonathan Edwards  Procedure(s) Performed: Procedure(s): EXPLORATORY LAPAROTOMY WITH HEMATOMA WASH OUT (N/A)  Patient Location: PACU  Anesthesia Type:General  Level of Consciousness:  sedated, patient cooperative and responds to stimulation  Airway & Oxygen Therapy:Patient Spontanous Breathing and Patient connected to face mask oxgen  Post-op Assessment:  Report given to PACU RN and Post -op Vital signs reviewed and stable  Post vital signs:  Reviewed and stable  Last Vitals:  Vitals:   07/02/23 1154 07/02/23 1800  BP: (!) 129/93 (!) 154/98  Pulse: 93 96  Resp: 16 18  Temp: 36.8 C 36.8 C  SpO2: 99% 97%    Complications: No apparent anesthesia complications

## 2023-07-02 NOTE — Anesthesia Preprocedure Evaluation (Addendum)
Anesthesia Evaluation  Patient identified by MRN, date of birth, ID band Patient awake    Reviewed: Allergy & Precautions, NPO status , Patient's Chart, lab work & pertinent test results  History of Anesthesia Complications Negative for: history of anesthetic complications  Airway Mallampati: II  TM Distance: >3 FB Neck ROM: Full    Dental  (+) Dental Advisory Given, Missing, Chipped   Pulmonary former smoker   breath sounds clear to auscultation       Cardiovascular hypertension, Pt. on medications (-) angina  Rhythm:Regular Rate:Normal     Neuro/Psych   Anxiety Depression    glaucoma    GI/Hepatic Neg liver ROS,GERD  Controlled,,  Endo/Other    Class 3 obesityBMI 33.8  Renal/GU negative Renal ROS     Musculoskeletal  (+) Arthritis ,    Abdominal   Peds  Hematology  (+) Blood dyscrasia (Hb 8.9, plt 189k), anemia   Anesthesia Other Findings H/o prostate cancer Ventral incisional hernia repair 06/30/2023, now bleeding  Reproductive/Obstetrics                             Anesthesia Physical Anesthesia Plan  ASA: 2 and emergent  Anesthesia Plan: General   Post-op Pain Management: Ofirmev IV (intra-op)*   Induction: Intravenous and Rapid sequence  PONV Risk Score and Plan: 2 and Ondansetron and Dexamethasone  Airway Management Planned: Oral ETT  Additional Equipment: None  Intra-op Plan:   Post-operative Plan: Extubation in OR  Informed Consent: I have reviewed the patients History and Physical, chart, labs and discussed the procedure including the risks, benefits and alternatives for the proposed anesthesia with the patient or authorized representative who has indicated his/her understanding and acceptance.     Dental advisory given  Plan Discussed with: CRNA and Surgeon  Anesthesia Plan Comments:         Anesthesia Quick Evaluation

## 2023-07-02 NOTE — Op Note (Signed)
Preoperative diagnosis: Postoperative hemorrhage  Postoperative diagnosis: same   Procedure: Re-opening laparotomy with washout  Surgeon: Melody Haver, M.D.  Asst: None  Anesthesia: General   Indications for procedure: Jonathan Edwards is a 62 y.o. year old male who underwent a ventral hernia repair with mesh two days ago.  He had significant sanguinous output from his retrorectus drain and his hemoglobin trended down. Given the concern for bleeding, I recommended that we return to the OR for exploration.  I addressed all of his questions and consent was obtained  Description of procedure: The patient was brought into the operative suite. Anesthesia was administered with General endotracheal anesthesia. WHO checklist was applied. The patient was then placed in supine position and padded appropriately. The area was prepped and draped in the usual sterile fashion.  I began by removing the dermabond from his prior surgery. I then used scissors to cut the dermal suture and bluntly entered the subcutaneous space. There was a small amount of clot but this plane was otherwise unremarkable. I then cut the fascial suture and entered into the retrorectus plane, anterior to the posterior sheath. The posterior sheath and overlying mesh were intact.  There was about of old clot that I evacuated.  I irrigated with several liters of warm sterile saline and then began a systematic evaluation of the space.  I evaluated all quadrants as well as the pelvis.  There was some clot adherent to the raw surface of the bilateral rectus muscles, but there were no signs of active hemorrhage.  I paused for 10 minutes and then performed another thorough inspection. There were no pools of blood and no signs of active hemorrhage.  I irrigated with another 2 liters of saline and the effluent ran clear.  I packed the space with lap pads and and then removed them to evaluate for any sign of active bleeding. I inspected the drain  insertion site and there was no sign of active bleeding.  I then placed hemostatic agent on the surface of both rectus muscles and transitioned to closure.  I closed the fascia with running #1 stratafix suture.  I approximated the deep subcutaneous space with interrupted 3-0 vicryl.  I ran a 3-0 vicryl to close the deep dermal layer and finally a 4-0 monocryl to approximate the skin.  Dermabond was applied.  The needle, sponge, and instrument counts were reported as correct.  He was extubated and brought to PACU by anesthesia.   Findings: Large volume of old clot in the retrorectus space.  Exposed rectus muscle with some adherent clot but no sign of active hemorrhage.    Blood loss: 50mL from this procedure.  of old clot evacuated   Complications: None  Melody Haver, M.D. General Surgery [facility], [place]

## 2023-07-02 NOTE — Progress Notes (Signed)
Mobility Specialist - Progress Note   07/02/23 1044  Mobility  Activity Ambulated independently in hallway  Level of Assistance Independent  Assistive Device None  Distance Ambulated (ft) 500 ft  Activity Response Tolerated well  Mobility Referral Yes  $Mobility charge 1 Mobility  Mobility Specialist Start Time (ACUTE ONLY) [id number]  Mobility Specialist Stop Time (ACUTE ONLY) 1006  Mobility Specialist Time Calculation (min) (ACUTE ONLY) 14 min   Pt received in bed and agreeable to mobility. No complaints during session. Pt to bed after session with all needs met.     [facility]

## 2023-07-02 NOTE — Anesthesia Postprocedure Evaluation (Signed)
Anesthesia Post Note  Patient: [employer]  Procedure(s) Performed: EXPLORATORY LAPAROTOMY WITH HEMATOMA WASH OUT (Abdomen)     Patient location during evaluation: PACU Anesthesia Type: General Level of consciousness: awake and alert, patient cooperative and oriented Pain management: pain level controlled Vital Signs Assessment: post-procedure vital signs reviewed and stable Respiratory status: spontaneous breathing, nonlabored ventilation and respiratory function stable Cardiovascular status: blood pressure returned to baseline and stable Postop Assessment: no apparent nausea or vomiting Anesthetic complications: no   No notable events documented.  Last Vitals:  Vitals:   07/02/23 2115 07/02/23 2130  BP: (!) 146/101 (!) 154/105  Pulse: 94 91  Resp: 11 12  Temp:    SpO2: 96% 96%    Last Pain:  Vitals:   07/02/23 2130  TempSrc:   PainSc: 0-No pain                 Kamilah Correia,E. Treyvone Chelf

## 2023-07-02 NOTE — Care Plan (Signed)
Patient was seen and examined in the pre-operative area.  He had been tolerating a diet and overall doing well, but his repeat Hb decreased and his drain remains sanguinous. I believe the best course at this point is to return to the operating room to evaluate the source of the bleeding.  I do not plan to explant the mesh or violate the posterior sheath, but I did explain to him that my plan will ultimately depend on what I find in the operating room.  We discussed the alternatives and potential risks of surgery, including but not limited to: failure to identify a source of his bleeding, recurrent bleeding, infection, mesh complications, damage to bowel or surrounding structures. All questions were addressed and consent was obtained.    Melody Haver, MD   General Surgeon [facility], [place]

## 2023-07-02 NOTE — Anesthesia Procedure Notes (Signed)
Procedure Name: Intubation Date/Time: 07/02/2023 6:43 PM  Performed by: Nathen May, CRNAPre-anesthesia Checklist: Patient identified, Emergency Drugs available, Suction available and Patient being monitored Patient Re-evaluated:Patient Re-evaluated prior to induction Oxygen Delivery Method: Circle System Utilized Preoxygenation: Pre-oxygenation with 100% oxygen Induction Type: IV induction, Rapid sequence and Cricoid Pressure applied Ventilation: Mask ventilation without difficulty Laryngoscope Size: Mac and 3 Grade View: Grade II Tube type: Oral Tube size: 7.5 mm Number of attempts: 1 Airway Equipment and Method: Stylet and Oral airway Placement Confirmation: ETT inserted through vocal cords under direct vision, positive ETCO2 and breath sounds checked- equal and bilateral Tube secured with: Tape Dental Injury: Teeth and Oropharynx as per pre-operative assessment

## 2023-07-02 NOTE — Progress Notes (Signed)
    2 Days Post-Op  Subjective: Drain output decreased significantly throughout the day but Hb still down trending, 9.7 this AM. Tolerating CLD.  + Flatus. No BM yet. Denies complaints this morning.      ROS: See above, otherwise other systems negative  Objective: Vital signs in last 24 hours: Temp:  [98 F (36.7 C)-98.6 F (37 C)] 98.1 F (36.7 C) (11/20 0615) Pulse Rate:  [85-99] 99 (11/20 0615) Resp:  [14-18] 16 (11/20 0615) BP: (122-149)/(81-102) 149/96 (11/20 0615) SpO2:  [95 %-100 %] 97 % (11/20 0615) FiO2 (%):  [0 %] 0 % (11/19 1528) Last BM Date : 06/29/23  Intake/Output from previous day: 11/19 0701 - 11/20 0700 In: 1855.1 [P.O.:1230; I.V.:625.1] Out: 1270 [Urine:700; Drains:570] Intake/Output this shift: Total I/O In: 510 [P.O.:510] Out: 165 [Drains:165]  PE: Gen: male resting in bed, NAD Abd: soft, non-distended, appropriately tender, dermabond intact, JP with sanguinous output  Lab Results:  Recent Labs    07/01/23 1459 07/02/23 0450  WBC 12.5* 11.7*  HGB 10.9* 9.7*  HCT 34.7* 31.2*  PLT 196 189   BMET Recent Labs    07/01/23 0450 07/02/23 0450  NA 134* 134*  K 4.2 4.0  CL 103 101  CO2 23 26  GLUCOSE 128* 108*  BUN 15 19  CREATININE 0.86 1.19  CALCIUM 8.4* 8.1*   PT/INR No results for input(s): "LABPROT", "INR" in the last 72 hours. CMP     Component Value Date/Time   NA 134 (L) 07/02/2023 0450   K 4.0 07/02/2023 0450   CL 101 07/02/2023 0450   CO2 26 07/02/2023 0450   GLUCOSE 108 (H) 07/02/2023 0450   BUN 19 07/02/2023 0450   CREATININE 1.19 07/02/2023 0450   CALCIUM 8.1 (L) 07/02/2023 0450   PROT 7.5 04/03/2023 2228   ALBUMIN 4.2 04/03/2023 2228   AST 33 04/03/2023 2228   ALT 33 04/03/2023 2228   ALKPHOS 58 04/03/2023 2228   BILITOT 0.7 04/03/2023 2228   GFRNONAA >60 07/02/2023 0450   GFRAA >60 03/14/2020 0543   Lipase     Component Value Date/Time   LIPASE 31 04/03/2023 2228    Studies/Results: No results  found.  Anti-infectives: Anti-infectives (From admission, onward)    Start     Dose/Rate Route Frequency Ordered Stop   06/30/23 0600  ceFAZolin (ANCEF) IVPB 2g/100 mL premix        2 g 200 mL/hr over 30 Minutes Intravenous On call to O.R. 06/30/23 0981 06/30/23 0805       Assessment/Plan 62 y/o M POD 2 from ex-lap, LOA, and incisional hernia repair with mesh.  - Advance to regular diet this morning - Monitor drain output.  His drain output has decreased significantly so will hold off on OR for now.  - Discontinue dPCA.  Scheduled Tylenol and Ibuprofen.  PRN Roxicodone, dilaudid  - OOB and ambulating in the hall today. Off oxygen.  - Holding Lovenox for today  DM (POA) - SSI   LOS: 2 days   Tacy [facility] Surgery 07/02/2023, 6:58 AM Please see Amion for pager number during day hours 7:00am-4:30pm or 7:00am -11:30am on weekends

## 2023-07-03 ENCOUNTER — Encounter (HOSPITAL_COMMUNITY): Payer: Self-pay | Admitting: General Surgery

## 2023-07-03 LAB — BASIC METABOLIC PANEL
Anion gap: 10 (ref 5–15)
BUN: 15 mg/dL (ref 8–23)
CO2: 22 mmol/L (ref 22–32)
Calcium: 7.9 mg/dL — ABNORMAL LOW (ref 8.9–10.3)
Chloride: 103 mmol/L (ref 98–111)
Creatinine, Ser: 0.98 mg/dL (ref 0.61–1.24)
GFR, Estimated: >60 mL/min (ref >60)
Glucose, Bld: 161 mg/dL — ABNORMAL HIGH (ref 70–99)
Potassium: 4.2 mmol/L (ref 3.5–5.1)
Sodium: 135 mmol/L (ref 135–145)

## 2023-07-03 LAB — CBC
HCT: 26.7 % — ABNORMAL LOW (ref 39.0–52.0)
Hemoglobin: 8.4 g/dL — ABNORMAL LOW (ref 13.0–17.0)
MCH: 25.8 pg — ABNORMAL LOW (ref 26.0–34.0)
MCHC: 31.5 g/dL (ref 30.0–36.0)
MCV: 81.9 fL (ref 80.0–100.0)
Platelets: 177 10*3/uL (ref 150–400)
RBC: 3.26 MIL/uL — ABNORMAL LOW (ref 4.22–5.81)
RDW: 15.8 % — ABNORMAL HIGH (ref 11.5–15.5)
WBC: 11.3 10*3/uL — ABNORMAL HIGH (ref 4.0–10.5)
nRBC: 0 % (ref 0.0–0.2)

## 2023-07-03 LAB — MAGNESIUM: Magnesium: 2 mg/dL (ref 1.7–2.4)

## 2023-07-03 LAB — HEMOGLOBIN AND HEMATOCRIT, BLOOD
HCT: 25.3 % — ABNORMAL LOW (ref 39.0–52.0)
Hemoglobin: 8.2 g/dL — ABNORMAL LOW (ref 13.0–17.0)

## 2023-07-03 MED ORDER — POLYETHYLENE GLYCOL 3350 17 G PO PACK
17.0000 g | PACK | Freq: Every day | ORAL | Status: DC
  Administered 2023-07-03 – 2023-07-05 (×3): 17 g via ORAL
  Filled 2023-07-03 (×3): qty 1

## 2023-07-03 MED ORDER — HYDROMORPHONE HCL 1 MG/ML IJ SOLN: 0.5000 mg | INTRAMUSCULAR | Status: DC | PRN

## 2023-07-03 MED ORDER — DOCUSATE SODIUM 100 MG PO CAPS
100.0000 mg | ORAL_CAPSULE | Freq: Two times a day (BID) | ORAL | Status: DC
  Administered 2023-07-03 – 2023-07-06 (×7): 100 mg via ORAL
  Filled 2023-07-03 (×7): qty 1

## 2023-07-03 NOTE — Progress Notes (Signed)
    1 Day Post-Op  Subjective: Returned to the OR given concern for ongoing bleeding but no clear source identified. Hb 8.4 this morning, drain thinning out.  Tolerating PO.  + Flatus.      ROS: See above, otherwise other systems negative  Objective: Vital signs in last 24 hours: Temp:  [97.5 F (36.4 C)-98.7 F (37.1 C)] 98.6 F (37 C) (11/21 0510) Pulse Rate:  [85-98] 92 (11/21 0510) Resp:  [11-19] 16 (11/21 0510) BP: (104-156)/(65-108) 138/89 (11/21 0510) SpO2:  [95 %-100 %] 98 % (11/21 0510) Weight:  [112.9 kg] 112.9 kg (11/20 1800) Last BM Date : 06/29/23  Intake/Output from previous day: 11/20 0701 - 11/21 0700 In: 2219.9 [P.O.:650; I.V.:1569.9] Out: 1755 [Urine:900; Drains:305; Blood:550] Intake/Output this shift: No intake/output data recorded.  PE: Gen: male resting in bed, NAD Abd: soft, non-distended, appropriately tender, dermabond intact, JP with sanguinous output  Lab Results:  Recent Labs    07/02/23 0450 07/02/23 1550 07/03/23 0456  WBC 11.7*  --  11.3*  HGB 9.7* 8.9* 8.4*  HCT 31.2* 27.1* 26.7*  PLT 189  --  177   BMET Recent Labs    07/02/23 0450 07/03/23 0456  NA 134* 135  K 4.0 4.2  CL 101 103  CO2 26 22  GLUCOSE 108* 161*  BUN 19 15  CREATININE 1.19 0.98  CALCIUM 8.1* 7.9*   PT/INR No results for input(s): "LABPROT", "INR" in the last 72 hours. CMP     Component Value Date/Time   NA 135 07/03/2023 0456   K 4.2 07/03/2023 0456   CL 103 07/03/2023 0456   CO2 22 07/03/2023 0456   GLUCOSE 161 (H) 07/03/2023 0456   BUN 15 07/03/2023 0456   CREATININE 0.98 07/03/2023 0456   CALCIUM 7.9 (L) 07/03/2023 0456   PROT 7.5 04/03/2023 2228   ALBUMIN 4.2 04/03/2023 2228   AST 33 04/03/2023 2228   ALT 33 04/03/2023 2228   ALKPHOS 58 04/03/2023 2228   BILITOT 0.7 04/03/2023 2228   GFRNONAA >60 07/03/2023 0456   GFRAA >60 03/14/2020 0543   Lipase     Component Value Date/Time   LIPASE 31 04/03/2023 2228    Studies/Results: No  results found.  Anti-infectives: Anti-infectives (From admission, onward)    Start     Dose/Rate Route Frequency Ordered Stop   07/02/23 1832  ceFAZolin (ANCEF) 2-4 GM/100ML-% IVPB       Note to Pharmacy: Jolyn Nap B: cabinet override      07/02/23 1832 07/03/23 0644   06/30/23 0600  ceFAZolin (ANCEF) IVPB 2g/100 mL premix        2 g 200 mL/hr over 30 Minutes Intravenous On call to O.R. 06/30/23 0538 06/30/23 0805       Assessment/Plan 62 y/o M POD 2 from ex-lap, LOA, and incisional hernia repair with mesh.  - Regular diet - Monitor drain output.  - Scheduled Tylenol and Ibuprofen.  PRN Roxicodone, dilaudid  - OOB and ambulating in the hall today. Off oxygen.  - Holding Lovenox today  DM (POA) - SSI   LOS: 3 days   T[facility] 07/03/2023, 7:57 AM Please see Amion for pager number during day hours 7:00am-4:30pm or 7:00am -11:30am on weekends

## 2023-07-04 LAB — CBC
HCT: 24 % — ABNORMAL LOW (ref 39.0–52.0)
Hemoglobin: 7.5 g/dL — ABNORMAL LOW (ref 13.0–17.0)
MCH: 25.4 pg — ABNORMAL LOW (ref 26.0–34.0)
MCHC: 31.3 g/dL (ref 30.0–36.0)
MCV: 81.4 fL (ref 80.0–100.0)
Platelets: 195 10*3/uL (ref 150–400)
RBC: 2.95 MIL/uL — ABNORMAL LOW (ref 4.22–5.81)
RDW: 16.2 % — ABNORMAL HIGH (ref 11.5–15.5)
WBC: 13.5 10*3/uL — ABNORMAL HIGH (ref 4.0–10.5)
nRBC: 0.4 % — ABNORMAL HIGH (ref 0.0–0.2)

## 2023-07-04 LAB — BASIC METABOLIC PANEL
Anion gap: 7 (ref 5–15)
BUN: 14 mg/dL (ref 8–23)
CO2: 25 mmol/L (ref 22–32)
Calcium: 8 mg/dL — ABNORMAL LOW (ref 8.9–10.3)
Chloride: 109 mmol/L (ref 98–111)
Creatinine, Ser: 0.85 mg/dL (ref 0.61–1.24)
GFR, Estimated: >60 mL/min (ref >60)
Glucose, Bld: 108 mg/dL — ABNORMAL HIGH (ref 70–99)
Potassium: 3.7 mmol/L (ref 3.5–5.1)
Sodium: 141 mmol/L (ref 135–145)

## 2023-07-04 LAB — PREPARE RBC (CROSSMATCH)

## 2023-07-04 LAB — MAGNESIUM: Magnesium: 2.2 mg/dL (ref 1.7–2.4)

## 2023-07-04 MED ORDER — BISACODYL 10 MG RE SUPP
10.0000 mg | Freq: Every day | RECTAL | Status: DC | PRN
  Administered 2023-07-04: 10 mg via RECTAL
  Filled 2023-07-04: qty 1

## 2023-07-04 MED ORDER — SODIUM CHLORIDE 0.9% IV SOLUTION: Freq: Once | INTRAVENOUS | Status: AC

## 2023-07-04 MED ORDER — POTASSIUM CHLORIDE 20 MEQ PO PACK
60.0000 meq | PACK | Freq: Once | ORAL | Status: AC
  Administered 2023-07-04: 60 meq via ORAL
  Filled 2023-07-04: qty 3

## 2023-07-04 NOTE — Progress Notes (Signed)
    2 Days Post-Op  Subjective: Hb still downtrending but drain is thinning out.  Tolerating PO and ambulating in the halls. + Flatus      ROS: See above, otherwise other systems negative  Objective: Vital signs in last 24 hours: Temp:  [97.5 F (36.4 C)-98.3 F (36.8 C)] 97.8 F (36.6 C) (11/22 0533) Pulse Rate:  [68-99] 81 (11/22 0533) Resp:  [14-18] 18 (11/22 0533) BP: (120-150)/(78-95) 142/94 (11/22 0533) SpO2:  [97 %-100 %] 99 % (11/22 0533) Last BM Date : 07/01/23  Intake/Output from previous day: 11/21 0701 - 11/22 0700 In: 1200 [P.O.:1200] Out: 235 [Drains:235] Intake/Output this shift: No intake/output data recorded.  PE: Gen: male resting in bed, NAD Abd: soft, non-distended, appropriately tender, dermabond intact, JP with thin sanguinous output  Lab Results:  Recent Labs    07/03/23 0456 07/03/23 1633 07/04/23 0438  WBC 11.3*  --  13.5*  HGB 8.4* 8.2* 7.5*  HCT 26.7* 25.3* 24.0*  PLT 177  --  195   BMET Recent Labs    07/03/23 0456 07/04/23 0438  NA 135 141  K 4.2 3.7  CL 103 109  CO2 22 25  GLUCOSE 161* 108*  BUN 15 14  CREATININE 0.98 0.85  CALCIUM 7.9* 8.0*   PT/INR No results for input(s): "LABPROT", "INR" in the last 72 hours. CMP     Component Value Date/Time   NA 141 07/04/2023 0438   K 3.7 07/04/2023 0438   CL 109 07/04/2023 0438   CO2 25 07/04/2023 0438   GLUCOSE 108 (H) 07/04/2023 0438   BUN 14 07/04/2023 0438   CREATININE 0.85 07/04/2023 0438   CALCIUM 8.0 (L) 07/04/2023 0438   PROT 7.5 04/03/2023 2228   ALBUMIN 4.2 04/03/2023 2228   AST 33 04/03/2023 2228   ALT 33 04/03/2023 2228   ALKPHOS 58 04/03/2023 2228   BILITOT 0.7 04/03/2023 2228   GFRNONAA >60 07/04/2023 0438   GFRAA >60 03/14/2020 0543   Lipase     Component Value Date/Time   LIPASE 31 04/03/2023 2228    Studies/Results: No results found.  Anti-infectives: Anti-infectives (From admission, onward)    Start     Dose/Rate Route Frequency Ordered  Stop   07/02/23 1832  ceFAZolin (ANCEF) 2-4 GM/100ML-% IVPB       Note to Pharmacy: Jolyn Nap B: cabinet override      07/02/23 1832 07/03/23 0644   06/30/23 0600  ceFAZolin (ANCEF) IVPB 2g/100 mL premix        2 g 200 mL/hr over 30 Minutes Intravenous On call to O.R. 06/30/23 0538 06/30/23 0805       Assessment/Plan 62 y/o M POD 4/2 from ex-lap, LOA, and incisional hernia repair with mesh w/ return for hematoma washout  - Regular diet - Monitor drain output. Will transfuse 1 pRBCs today - Scheduled Tylenol and Ibuprofen.  PRN Roxicodone, dilaudid  - OOB and ambulating in the hall today. Off oxygen.  - Continue holding Lovenox today  DM (POA) - SSI   LOS: 4 days   T[facility] 07/04/2023, 7:52 AM Please see Amion for pager number during day hours 7:00am-4:30pm or 7:00am -11:30am on weekends

## 2023-07-05 LAB — BASIC METABOLIC PANEL
Anion gap: 6 (ref 5–15)
BUN: 14 mg/dL (ref 8–23)
CO2: 25 mmol/L (ref 22–32)
Calcium: 8.3 mg/dL — ABNORMAL LOW (ref 8.9–10.3)
Chloride: 108 mmol/L (ref 98–111)
Creatinine, Ser: 0.96 mg/dL (ref 0.61–1.24)
GFR, Estimated: >60 mL/min (ref >60)
Glucose, Bld: 89 mg/dL (ref 70–99)
Potassium: 4.1 mmol/L (ref 3.5–5.1)
Sodium: 139 mmol/L (ref 135–145)

## 2023-07-05 LAB — CBC
HCT: 30.5 % — ABNORMAL LOW (ref 39.0–52.0)
Hemoglobin: 9.6 g/dL — ABNORMAL LOW (ref 13.0–17.0)
MCH: 26.5 pg (ref 26.0–34.0)
MCHC: 31.5 g/dL (ref 30.0–36.0)
MCV: 84.3 fL (ref 80.0–100.0)
Platelets: 235 10*3/uL (ref 150–400)
RBC: 3.62 MIL/uL — ABNORMAL LOW (ref 4.22–5.81)
RDW: 16.5 % — ABNORMAL HIGH (ref 11.5–15.5)
WBC: 12.9 10*3/uL — ABNORMAL HIGH (ref 4.0–10.5)
nRBC: 0.9 % — ABNORMAL HIGH (ref 0.0–0.2)

## 2023-07-05 LAB — MAGNESIUM: Magnesium: 2.1 mg/dL (ref 1.7–2.4)

## 2023-07-05 NOTE — Progress Notes (Signed)
    3 Days Post-Op  Subjective: Hb 9.6 this morning and drain output slowed significantly.  3 BM yesterday.  Denies complaints this morning.   ROS: See above, otherwise other systems negative  Objective: Vital signs in last 24 hours: Temp:  [97.8 F (36.6 C)-98.6 F (37 C)] 98 F (36.7 C) (11/23 0549) Pulse Rate:  [75-88] 75 (11/23 0549) Resp:  [16-18] 17 (11/23 0549) BP: (128-156)/(84-97) 147/97 (11/23 0549) SpO2:  [98 %-100 %] 99 % (11/23 0549) Last BM Date : 07/01/23  Intake/Output from previous day: 11/22 0701 - 11/23 0700 In: 752 [P.O.:360; Blood:392] Out: 20 [Drains:20] Intake/Output this shift: Total I/O In: 360 [P.O.:360] Out: -   PE: Gen: male resting in bed, NAD Abd: soft, non-distended, appropriately tender, dermabond intact, JP with thin sanguinous output  Lab Results:  Recent Labs    07/03/23 0456 07/03/23 1633 07/04/23 0438  WBC 11.3*  --  13.5*  HGB 8.4* 8.2* 7.5*  HCT 26.7* 25.3* 24.0*  PLT 177  --  195   BMET Recent Labs    07/03/23 0456 07/04/23 0438  NA 135 141  K 4.2 3.7  CL 103 109  CO2 22 25  GLUCOSE 161* 108*  BUN 15 14  CREATININE 0.98 0.85  CALCIUM 7.9* 8.0*   PT/INR No results for input(s): "LABPROT", "INR" in the last 72 hours. CMP     Component Value Date/Time   NA 141 07/04/2023 0438   K 3.7 07/04/2023 0438   CL 109 07/04/2023 0438   CO2 25 07/04/2023 0438   GLUCOSE 108 (H) 07/04/2023 0438   BUN 14 07/04/2023 0438   CREATININE 0.85 07/04/2023 0438   CALCIUM 8.0 (L) 07/04/2023 0438   PROT 7.5 04/03/2023 2228   ALBUMIN 4.2 04/03/2023 2228   AST 33 04/03/2023 2228   ALT 33 04/03/2023 2228   ALKPHOS 58 04/03/2023 2228   BILITOT 0.7 04/03/2023 2228   GFRNONAA >60 07/04/2023 0438   GFRAA >60 03/14/2020 0543   Lipase     Component Value Date/Time   LIPASE 31 04/03/2023 2228    Studies/Results: No results found.  Anti-infectives: Anti-infectives (From admission, onward)    Start     Dose/Rate Route  Frequency Ordered Stop   07/02/23 1832  ceFAZolin (ANCEF) 2-4 GM/100ML-% IVPB       Note to Pharmacy: Jolyn Nap B: cabinet override      07/02/23 1832 07/03/23 0644   06/30/23 0600  ceFAZolin (ANCEF) IVPB 2g/100 mL premix        2 g 200 mL/hr over 30 Minutes Intravenous On call to O.R. 06/30/23 0538 06/30/23 0805       Assessment/Plan 62 y/o M POD 5/3 from ex-lap, LOA, and incisional hernia repair with mesh w/ return for hematoma washout  - Regular diet - Monitor drain output. - Scheduled Tylenol and Ibuprofen.  PRN Roxicodone, dilaudid  - OOB and ambulating in the hall today. Off oxygen.  - Possible discharge tomorrow versus Monday  DM (POA) - SSI   LOS: 5 days   [facility] 07/05/2023, 6:34 AM Please see Amion for pager number during day hours 7:00am-4:30pm or 7:00am -11:30am on weekends

## 2023-07-06 LAB — BASIC METABOLIC PANEL
Anion gap: 6 (ref 5–15)
BUN: 14 mg/dL (ref 8–23)
CO2: 25 mmol/L (ref 22–32)
Calcium: 7.9 mg/dL — ABNORMAL LOW (ref 8.9–10.3)
Chloride: 104 mmol/L (ref 98–111)
Creatinine, Ser: 1 mg/dL (ref 0.61–1.24)
GFR, Estimated: >60 mL/min (ref >60)
Glucose, Bld: 101 mg/dL — ABNORMAL HIGH (ref 70–99)
Potassium: 3.5 mmol/L (ref 3.5–5.1)
Sodium: 135 mmol/L (ref 135–145)

## 2023-07-06 LAB — CBC
HCT: 30.4 % — ABNORMAL LOW (ref 39.0–52.0)
Hemoglobin: 9.8 g/dL — ABNORMAL LOW (ref 13.0–17.0)
MCH: 26.8 pg (ref 26.0–34.0)
MCHC: 32.2 g/dL (ref 30.0–36.0)
MCV: 83.1 fL (ref 80.0–100.0)
Platelets: 263 10*3/uL (ref 150–400)
RBC: 3.66 MIL/uL — ABNORMAL LOW (ref 4.22–5.81)
RDW: 16.4 % — ABNORMAL HIGH (ref 11.5–15.5)
WBC: 11.1 10*3/uL — ABNORMAL HIGH (ref 4.0–10.5)
nRBC: 0.5 % — ABNORMAL HIGH (ref 0.0–0.2)

## 2023-07-06 LAB — MAGNESIUM: Magnesium: 2.1 mg/dL (ref 1.7–2.4)

## 2023-07-06 MED ORDER — IBUPROFEN 200 MG PO TABS
600.0000 mg | ORAL_TABLET | Freq: Four times a day (QID) | ORAL | 0 refills | Status: AC
Start: 2023-07-06 — End: 2023-07-12

## 2023-07-06 MED ORDER — ACETAMINOPHEN 325 MG PO TABS: 650.0000 mg | ORAL_TABLET | Freq: Four times a day (QID) | ORAL | 0 refills | Status: AC

## 2023-07-06 MED ORDER — OXYCODONE HCL 5 MG PO TABS
5.0000 mg | ORAL_TABLET | Freq: Three times a day (TID) | ORAL | 0 refills | Status: AC | PRN
Start: 2023-07-06 — End: 2023-07-10

## 2023-07-06 NOTE — Progress Notes (Signed)
    4 Days Post-Op  Subjective: Hb stable and drain continues to thin out.  Had 2 more BM.    ROS: See above, otherwise other systems negative  Objective: Vital signs in last 24 hours: Temp:  [97.7 F (36.5 C)-98.3 F (36.8 C)] 98 F (36.7 C) (11/24 0524) Pulse Rate:  [72-83] 72 (11/24 0524) Resp:  [17-20] 17 (11/24 0524) BP: (131-153)/(88-98) 133/95 (11/24 0524) SpO2:  [98 %-99 %] 99 % (11/24 0524) Last BM Date : 07/04/23  Intake/Output from previous day: 11/23 0701 - 11/24 0700 In: 1440 [P.O.:1440] Out: 210 [Drains:210] Intake/Output this shift: No intake/output data recorded.  PE: Gen: male resting in bed, NAD Abd: soft, non-distended, appropriately tender, dermabond intact, JP with thin sanguinous output  Lab Results:  Recent Labs    07/05/23 0534 07/06/23 0449  WBC 12.9* 11.1*  HGB 9.6* 9.8*  HCT 30.5* 30.4*  PLT 235 263   BMET Recent Labs    07/05/23 0534 07/06/23 0449  NA 139 135  K 4.1 3.5  CL 108 104  CO2 25 25  GLUCOSE 89 101*  BUN 14 14  CREATININE 0.96 1.00  CALCIUM 8.3* 7.9*   PT/INR No results for input(s): "LABPROT", "INR" in the last 72 hours. CMP     Component Value Date/Time   NA 135 07/06/2023 0449   K 3.5 07/06/2023 0449   CL 104 07/06/2023 0449   CO2 25 07/06/2023 0449   GLUCOSE 101 (H) 07/06/2023 0449   BUN 14 07/06/2023 0449   CREATININE 1.00 07/06/2023 0449   CALCIUM 7.9 (L) 07/06/2023 0449   PROT 7.5 04/03/2023 2228   ALBUMIN 4.2 04/03/2023 2228   AST 33 04/03/2023 2228   ALT 33 04/03/2023 2228   ALKPHOS 58 04/03/2023 2228   BILITOT 0.7 04/03/2023 2228   GFRNONAA >60 07/06/2023 0449   GFRAA >60 03/14/2020 0543   Lipase     Component Value Date/Time   LIPASE 31 04/03/2023 2228    Studies/Results: No results found.  Anti-infectives: Anti-infectives (From admission, onward)    Start     Dose/Rate Route Frequency Ordered Stop   07/02/23 1832  ceFAZolin (ANCEF) 2-4 GM/100ML-% IVPB       Note to Pharmacy:  Jolyn Nap B: cabinet override      07/02/23 1832 07/03/23 0644   06/30/23 0600  ceFAZolin (ANCEF) IVPB 2g/100 mL premix        2 g 200 mL/hr over 30 Minutes Intravenous On call to O.R. 06/30/23 0538 06/30/23 0805       Assessment/Plan 62 y/o M POD 6/4 from ex-lap, LOA, and incisional hernia repair with mesh w/ return for hematoma washout  - Regular diet - Monitor drain output. - Scheduled Tylenol and Ibuprofen.  PRN Roxicodone, dilaudid  - OOB and ambulating in the hall today. Off oxygen.  - Plan for discharge today  DM (POA) - SSI   LOS: 6 days   [facility] 07/06/2023, 7:08 AM Please see Amion for pager number during day hours 7:00am-4:30pm or 7:00am -11:30am on weekends

## 2023-07-06 NOTE — Discharge Instructions (Signed)
Home Care After Complex Abdominal Wall Hernia Repair  Activity  Limit activity for the first 24 hours, then you may return to normal daily activities. Returning to normal daily activities as soon as you can following surgery will enhance recovery time.  No heavy lifting pushing or pulling, anything heavier than 10 pounds (gallon of milk weighs approx. 8.8 pounds) for 12 weeks from surgery date.  Wear abdominal binder for 12 weeks.  Do not mow the lawn, use a vacuum cleaner, or do any other strenuous activities without first consulting your surgical team.  Climb stairs slowly and watch your step.  Walk as often as you feel able to increase strength and endurance.  No driving or operating heavy machinery within 24 hours of taking narcotic pain medication.  Diet  Drink plenty of fluids and eat a light meal on the night of surgery. Some patients may find their appetite is poor for a week or two after surgery. This is a normal result of the stress of surgery-your appetite will return in time.   There are no specific diet restrictions after surgery.  Drinking plenty of fluids and high fiber diet will assist with the prevention of constipation.   Dressings and Wound Care  Keep your wound or incision site clean and dry.  You may have different types of dressings covering your incisions depending on your operation and your surgeon: o Dermabond/Durabond (skin glue): This will usually remain in place for 10-14 days, then naturally fall off your skin. You may take a shower 24 hrs after surgery, carefully wash, not scrub the incision site with a mild non-scented soap. Pat dry with a soft towel.  Do not pick or peel skin glue off.   You can shower and let the water fall on the dressings above. Do not soak or submerge your incision(s) in a bath tub, hot tub, or swimming pool, until your doctor says it is ok to do so or the incision(s) have completely healed, usually about 2-4 weeks.  Do not use creams, powder,  salves or balms on your incision(s).  Drain Care  If you are discharged with a drain you will given instructions on changing the dressing around the drain, how to empty the drain, how to clean the skin around the drain tube, and how to prevent clogs in the drain from your nurse.   Remember to empty the drain when it is half full, and measure and record the output every time you empty the drain.  Please bring your daily drain output log with you to your appointments for the surgical team to review.  This log helps determine if the drain is ready to be removed by the surgical team.   Before emptying the drain please remember to look for any clots and clear them from the drain tube by "stripping" or "milking" the tubing. Follow the steps for stripping or milking the tube: o Use one hand to hold and pinch the tube close to where it leaves the skin. o With the thumb and first finger of your other hand, pinch the tube just below where you are holding it.  o Slowly and firmly push your thumb and first finger down the tubing toward the end of the tube. o Repeat as many times as needed to move the clot.   It is important that the drain bulb stays decompressed or flat as this will create the suction that pulls the fluid into the bulb.  stripping the drain tube to  prevent the tube from clogging.  Call your surgeon's office if: o The drainage color has suddenly changed from a light, clear red or pink to bloody or bright red in color. o The drainage smell has changed. o The drainage has pus (thick yellowish fluid). o There is a lot of fluid around the drain. o Signs of infection, such as sudden increased pain, swelling, warmth, or redness around the drain tube.  **When the output is less than 30mL for 3 days in a row then call the office to schedule an appointment for drain removal**  What to Expect After Surgery   Moderate discomfort controlled with medications  Minimal drainage from incision  You may  feel pain in one or both shoulders. This pain comes from the gas still left in your belly after the surgery, if you had laparoscopic surgery (several small incisions). The pain should ease over several days to a week. Ambulation will help with this pain.   Belly swelling  Feeling fatigue and weak  Constipation after surgery is common. Drink plenty fluids and eat a high fiber diet.  Swelling - In some patients might feel that their hernia has returned after surgery-DO NOT Worry this is normal. Swelling may be due to the development of a seroma. Seroma is fluid that has built up where the hernia was repaired this is a normal result after surgery and it will slowly reabsorb back into your body over the next several weeks.   Pain Control: Prescribed Narcotic and Non-Narcotic Pain Medication  You will be sent home with two prescriptions. One of them will be for prescription strength acetaminophen (i.e. Tylenol) and prescription strength NSAIDs (i.e. celecoxib (Celebrex), meloxicam (Mobic), ibuprofen (Advil), or naproxen (Aleve)).  One prescription will be an oral narcotic (i.e. oxycodone) to take as needed for pain control.    As your pain improves, the narcotic (i.e. oxycodone) pain medication should be the first pain medication to stop. Narcotic can be addictive and habit forming.  You may also be sent home with or prescribed later, a nerve pain medication, gabapentin (Neurontin), if pain control needs are not met with the above.  Pain Control: Opioid Sparing Protocol - Prescribed Non-Narcotic Pain Medication  If you are taking part in the opioid sparing protocol, you will be given three prescriptions.  Two of them will be for prescription strength ibuprofen (i.e. Advil) and prescription strength acetaminophen (i.e. Tylenol).  The vast majority of patients will just need these two medications.  One prescription will be for a 'rescue' prescription of an oral narcotic (oxycodone).  You may fill this if  needed.  You will alternate taking the ibuprofen (600mg ) every 6 hours and also the acetaminophen (650mg ) every 6 hours so that you are taking one of those medications every 3 hours.  For example: o 0800 - take ibuprofen 600mg  o 1100 - take acetaminophen 650mg  o 1400 - take ibuprofen 600mg  o 1700 - take acetaminophen 650mg  o Etc.  Continue taking this alternating pattern of ibuprofen and acetaminophen for 3 days  If you cannot take one or the other of these medications, just take the one you can every 6 hours.  If you are comfortable at night, you don't have to wake up and take a medication.  If you are still uncomfortable after taking either ibuprofen or acetaminophen, try gentle stretching exercise and ice packs (a bag of frozen vegetables works great).  If you are still uncomfortable, you may fill the narcotic prescription of Oxycodone and  take as directed.  Once you have completed these prescriptions, your pain level should be low enough to stop taking medications altogether or just use an over the counter medication (ibuprofen or acetaminophen) as needed.   Pain Control: Over the Counter Medications to take as needed  Acetaminophen/Ibuprofen: Once you are finished with prescribed narcotic and non-narcotic pain medications, your pain level should be low enough to start using over the counter pain relief medications (ibuprofen or acetaminophen) as needed.  Colace/Docusate: May be prescribed by your surgeon to prevent constipation caused by the combination of narcotics, effects of anesthesia, and decreased ambulation.  Hold for loose stools or diarrhea. Take 100 mg 1-2 times a day starting tonight.   Fiber: High fiber foods, extra liquids (water 9-13 cups/day) can also assist with constipation. Examples of high fiber foods are fruit, bran. Prune juice and water are also good liquids to drink.  Milk of Magnesia/Miralax:  If constipated despite take the over the counter stool softeners you may take  Milk of Magnesia or Miralax as directed on bottle to assist with constipation.     Pepcid/Famotidine: May be prescribed while taking naproxen (Aleve) or other NSAIDs such as ibuprofen (Motrin/Advil) to prevent stomach upset or Acid-reflux symptoms. Take 1 tablet 1-2 times a day.   **Constipation: The first bowel movement may occur anywhere between 1-5 days after surgery.  As long as you are not nauseated or not having significant abdominal pain this variation is acceptable. Narcotic pain medications can cause constipation increasing discomfort; early discontinuation will assist with bowel management. If constipated despite taking stool softeners, you may take Milk of Magnesia or Miralax as directed on the bottle.     **Home medications: You may restart your home medications as directed by your respective Primary Care Physician or Surgeon.   When to notify your Doctor or Healthcare Team  Sign of Wound Infection   Fever over 100 degrees.  Wound becomes extremely swollen, shows red streaks, warm to the touch, and/or drainage from the incision site or foul-smelling drainage.  Wound edges separate or opens up  Bleeding or bruising   If you have bleeding, apply pressure to the site and hold the pressure firmly for 5 minutes. If the bleeding continues, apply pressure again and call 911. If the bleeding stopped, call your doctor to report it.   Call your doctor or nurse if you have increased bleeding from your site and increased bruising or a lump forms or gets larger under your skin at the site.  Unrelieved Pain    Call your doctor or nurse if your pain gets worse or is not eased 1 hour after taking your pain medicine, or if it is severe and uncontrolled.  Nausea and Vomiting    Call your doctor or nurse if you have nausea and vomiting that continues more than 24 hours, will not let you keep medicine down and will not let you keep fluids down Fever, Flu-like symptoms   Fever over 100 degrees and/or  chills  Gastrointestinal Bleeding Symptoms    Black tarry bowel movements.  This can be normal after surgery on the stomach, but should resolve in a day or two.    Call 911 if you suddenly have signs of blood loss such as:  Vomiting blood  Fast heart rate  Feeling faint, sweaty, or blacking out  Passing bright red blood from your rectum  Blood Clot Symptoms  Tender, swollen or reddened areas in your calf muscle or thighs.  Numbness  or tingling in your lower leg or calf, or at the top of your leg or groin  Skin on your leg looks pale or blue or feels cold to touch  Chest pain or have trouble breathing, lightheadedness, fast heart rate  Sudden Onset of Symptoms    Call 911 if you suddenly have:  Leg weakness and spasm  Loss of bladder or bowel function  Seizure  Confusion, severe headache, dizziness or feeling unsteady, problems talking, difficulty swallowing, and/or numbness or muscle weakness as these could be signs of a stroke.  Follow up Appointment Your follow up appointment should be scheduled 2-3 weeks after your surgery date.  If you have not previously scheduled for a follow-up visit you can be scheduled by contacting [phone].

## 2023-07-07 LAB — TYPE AND SCREEN
ABO/RH(D): O POS
Antibody Screen: NEGATIVE
Unit division: 0

## 2023-07-07 LAB — BPAM RBC
Blood Product Expiration Date: 202412232359
ISSUE DATE / TIME: 202411220933
Unit Type and Rh: 5100

## 2023-07-07 NOTE — Discharge Summary (Addendum)
Physician Discharge Summary  Patient ID: Jonathan Edwards MRN: [id number] DOB/AGE: 1960-11-22 62 y.o.  Admit date: 06/30/2023 Discharge date: 07/06/2023  Admission Diagnoses:  Discharge Diagnoses:  Principal Problem:   Incisional hernia   Discharged Condition: good  Hospital Course: 62 y/o M w/ a hx of prior umbilical hernia s/p repair with mesh and prostatectomy who presented for elective ventral hernia repair with mesh.  He was taken to the OR on 06/30/23 for an open Rives-Stoppa repair.  His post-operative course was complicated by bleeding, and he was taken back to the OR on 11/20.  There were no major bleeding vessels identified, and his retrorectus space was irrigated and then the wound closed again.  His hemoglobin stabilized and the output from his drain decreased.  He was ultimately discharged on POD 6/4.  At the time of discharge he was ambulating without assistance, tolerating PO, having bowel function, and his hemoglobin had remained stable for 48 hours.   Consults: None  Treatments:  11/18: Open Rives-Stoppa VHR 11/20: Washout of the retrorectus space  Discharge Exam: Blood pressure (!) 133/95, pulse 72, temperature 98 F (36.7 C), temperature source Oral, resp. rate 17, height 6' (1.829 m), weight 112.9 kg, SpO2 99%.  Disposition: Discharge disposition: 01-Home or Self Care        Allergies as of 07/06/2023   No Known Allergies      Medication List     TAKE these medications    acetaminophen 325 MG tablet Commonly known as: Tylenol Take 2 tablets (650 mg total) by mouth every 6 (six) hours for 6 days.   amLODipine 5 MG tablet Commonly known as: NORVASC TAKE 1 TABLET (5 MG TOTAL) BY MOUTH DAILY.   B-12 2500 MCG Tabs Take 2,500 mcg by mouth daily.   buPROPion 150 MG 24 hr tablet Commonly known as: Wellbutrin XL Take 1 tablet (150 mg total) by mouth in the morning and at bedtime.   cyclobenzaprine 10 MG tablet Commonly known as: FLEXERIL Take 10  mg by mouth 3 (three) times daily as needed for muscle spasms.   ibuprofen 200 MG tablet Commonly known as: Motrin IB Take 3 tablets (600 mg total) by mouth every 6 (six) hours for 6 days.   Iron (Ferrous Sulfate) 325 (65 Fe) MG Tabs Take 325 mg by mouth every other day.   lisinopril 20 MG tablet Commonly known as: ZESTRIL TAKE 1 TABLET BY MOUTH EVERY DAY   Magnesium 250 MG Caps Take 250 mg by mouth daily.   multivitamin with minerals Tabs tablet Take 1 tablet by mouth daily.   oxyCODONE 5 MG immediate release tablet Commonly known as: Roxicodone Take 1 tablet (5 mg total) by mouth every 8 (eight) hours as needed for up to 4 days.   TRIPLE OMEGA COMPLEX PO Take 1 capsule by mouth daily.   Vitamin D 50 MCG (2000 UT) tablet Take 2,000 Units by mouth daily.   zinc gluconate 50 MG tablet Take 50 mg by mouth daily.        Follow-up Information     Resources Follow up.          Resources Follow up.   Why: FOOD PANTRY [employer] [facility] [address] [phone]  [facility] [employer] [address] [place] [phone]  [facility] [employer] [address] [place] [phone]  [facility] [address] [phone]  [facility]  [address] [place], [place] [address] [phone]  UTILITIES [facility] [address] [place] [phone] Rental assistance/rental hotline: [phone] ext. 340.    Utility assistance/utility hotline: [phone]) [phone] ext. [address] Department of IT consultant (heating/cooling and water assistance) [phone] (rental and utility assistance) [phone]  [employer] - call 211                Signed: Moise Boring 07/07/2023, 7:17 AM

## 2023-07-09 ENCOUNTER — Other Ambulatory Visit: Payer: Self-pay | Admitting: Family Medicine

## 2023-07-09 DIAGNOSIS — I10 Essential (primary) hypertension: Secondary | ICD-10-CM

## 2023-07-14 ENCOUNTER — Telehealth: Payer: Self-pay | Admitting: *Deleted

## 2023-07-14 NOTE — Progress Notes (Signed)
  Care Coordination   Note   07/14/2023 Name: Jonathan Edwards MRN: [id number] DOB: [birth year]  Jonathan Edwards is a 63 y.o. year old male who sees Mliss Sax, MD for primary care. I reached out to [facility] by phone today to offer care coordination services.  [name] was given information about Care Coordination services today including:   The Care Coordination services include support from the care team which includes your Nurse Coordinator, Clinical Social Worker, or Pharmacist.  The Care Coordination team is here to help remove barriers to the health concerns and goals most important to you. Care Coordination services are voluntary, and the patient may decline or stop services at any time by request to their care team member.   Care Coordination Consent Status: Patient agreed to services and verbal consent obtained.   Follow up plan:  Telephone appointment with care coordination team member scheduled for:  07/16/23  Encounter Outcome:  Patient Scheduled  [facility] Coordination Care Guide  Direct Dial: [phone]

## 2023-07-16 ENCOUNTER — Ambulatory Visit: Payer: Self-pay | Admitting: Licensed Clinical Social Worker

## 2023-07-16 NOTE — Patient Outreach (Signed)
  Care Coordination   Initial Visit Note   07/16/2023 Name: Jonathan Edwards MRN: [id number] DOB: 02-24-1961  Jonathan Edwards is a 62 y.o. year old male who sees Jonathan Sax, MD for primary care. I spoke with  Jonathan Edwards by phone today.  What matters to the patients health and wellness today?  Completed Emmi call with Jonathan Edwards. Patient reports he is healing and adjusting well after hospital discharge.     Goals Addressed   None     SDOH assessments and interventions completed:  Yes  SDOH Interventions Today    Flowsheet Row Most Recent Value  SDOH Interventions   Food Insecurity Interventions Intervention Not Indicated  Housing Interventions Intervention Not Indicated        Care Coordination Interventions:  Yes, provided   Interventions Today    Flowsheet Row Most Recent Value  Chronic Disease   Chronic disease during today's visit Other  [emmi call after hospital discharge]  General Interventions   General Interventions Discussed/Reviewed General Interventions Discussed  [Completed emmi call with Jonathan Edwards. Pt reports he is healing and adjusting well from recent surgery]  Mental Health Interventions   Mental Health Discussed/Reviewed Coping Strategies, Mental Health Discussed  [Jonathan Edwards reports having early difficulty of adjusting at home after surgery. However, Jonathan Edwards reports spouse is supportive and he is adjusting well and emotionally stable.]        Follow up plan: No further intervention required.   Encounter Outcome:  Patient Visit Completed   Gwyndolyn Saxon MSW, LCSW Licensed Clinical Social Worker  [facility], Population Health Direct Dial: [phone]  Fax: [phone]

## 2023-07-29 ENCOUNTER — Encounter: Payer: Self-pay | Admitting: Family Medicine

## 2023-08-03 ENCOUNTER — Other Ambulatory Visit: Payer: Self-pay

## 2023-08-03 ENCOUNTER — Encounter (HOSPITAL_COMMUNITY): Payer: Self-pay

## 2023-08-03 ENCOUNTER — Inpatient Hospital Stay (HOSPITAL_COMMUNITY)
Admission: EM | Admit: 2023-08-03 | Discharge: 2023-08-06 | DRG: 862 | Disposition: A | Discharge status: Home / Self Care | Payer: Federal, State, Local not specified - PPO | Attending: General Surgery | Admitting: General Surgery

## 2023-08-03 ENCOUNTER — Emergency Department (HOSPITAL_COMMUNITY): Payer: Federal, State, Local not specified - PPO

## 2023-08-03 DIAGNOSIS — Z9079 Acquired absence of other genital organ(s): Secondary | ICD-10-CM | POA: Diagnosis not present

## 2023-08-03 DIAGNOSIS — Z87891 Personal history of nicotine dependence: Secondary | ICD-10-CM

## 2023-08-03 DIAGNOSIS — I700 Atherosclerosis of aorta: Secondary | ICD-10-CM | POA: Diagnosis not present

## 2023-08-03 DIAGNOSIS — D72829 Elevated white blood cell count, unspecified: Secondary | ICD-10-CM | POA: Diagnosis present

## 2023-08-03 DIAGNOSIS — N179 Acute kidney failure, unspecified: Secondary | ICD-10-CM | POA: Diagnosis present

## 2023-08-03 DIAGNOSIS — R509 Fever, unspecified: Secondary | ICD-10-CM | POA: Diagnosis not present

## 2023-08-03 DIAGNOSIS — Z79899 Other long term (current) drug therapy: Secondary | ICD-10-CM

## 2023-08-03 DIAGNOSIS — Z8546 Personal history of malignant neoplasm of prostate: Secondary | ICD-10-CM

## 2023-08-03 DIAGNOSIS — I10 Essential (primary) hypertension: Secondary | ICD-10-CM | POA: Diagnosis not present

## 2023-08-03 DIAGNOSIS — A419 Sepsis, unspecified organism: Secondary | ICD-10-CM | POA: Diagnosis not present

## 2023-08-03 DIAGNOSIS — T8149XA Infection following a procedure, other surgical site, initial encounter: Secondary | ICD-10-CM | POA: Diagnosis not present

## 2023-08-03 DIAGNOSIS — K7689 Other specified diseases of liver: Secondary | ICD-10-CM | POA: Diagnosis not present

## 2023-08-03 DIAGNOSIS — E860 Dehydration: Secondary | ICD-10-CM | POA: Diagnosis present

## 2023-08-03 DIAGNOSIS — R42 Dizziness and giddiness: Secondary | ICD-10-CM | POA: Diagnosis not present

## 2023-08-03 DIAGNOSIS — Z5941 Food insecurity: Secondary | ICD-10-CM | POA: Diagnosis not present

## 2023-08-03 DIAGNOSIS — L02211 Cutaneous abscess of abdominal wall: Principal | ICD-10-CM | POA: Diagnosis present

## 2023-08-03 DIAGNOSIS — F419 Anxiety disorder, unspecified: Secondary | ICD-10-CM | POA: Diagnosis present

## 2023-08-03 DIAGNOSIS — K219 Gastro-esophageal reflux disease without esophagitis: Secondary | ICD-10-CM | POA: Diagnosis not present

## 2023-08-03 DIAGNOSIS — R188 Other ascites: Secondary | ICD-10-CM | POA: Diagnosis not present

## 2023-08-03 LAB — URINALYSIS, W/ REFLEX TO CULTURE (INFECTION SUSPECTED)
Bacteria, UA: NONE SEEN
Bilirubin Urine: NEGATIVE
Glucose, UA: NEGATIVE mg/dL
Hgb urine dipstick: NEGATIVE
Ketones, ur: NEGATIVE mg/dL
Leukocytes,Ua: NEGATIVE
Nitrite: NEGATIVE
Protein, ur: NEGATIVE mg/dL
Specific Gravity, Urine: 1.02 (ref 1.005–1.030)
pH: 6 (ref 5.0–8.0)

## 2023-08-03 LAB — COMPREHENSIVE METABOLIC PANEL
ALT: 19 U/L (ref 0–44)
AST: 18 U/L (ref 15–41)
Albumin: 3.3 g/dL — ABNORMAL LOW (ref 3.5–5.0)
Alkaline Phosphatase: 55 U/L (ref 38–126)
Anion gap: 7 (ref 5–15)
BUN: 15 mg/dL (ref 8–23)
CO2: 24 mmol/L (ref 22–32)
Calcium: 8.3 mg/dL — ABNORMAL LOW (ref 8.9–10.3)
Chloride: 105 mmol/L (ref 98–111)
Creatinine, Ser: 1.27 mg/dL — ABNORMAL HIGH (ref 0.61–1.24)
GFR, Estimated: >60 mL/min (ref >60)
Glucose, Bld: 121 mg/dL — ABNORMAL HIGH (ref 70–99)
Potassium: 3.8 mmol/L (ref 3.5–5.1)
Sodium: 136 mmol/L (ref 135–145)
Total Bilirubin: 0.3 mg/dL (ref <1.2)
Total Protein: 6.7 g/dL (ref 6.5–8.1)

## 2023-08-03 LAB — CBC WITH DIFFERENTIAL/PLATELET
Abs Immature Granulocytes: 0.06 10*3/uL (ref 0.00–0.07)
Basophils Absolute: 0.1 10*3/uL (ref 0.0–0.1)
Basophils Relative: 0 %
Eosinophils Absolute: 0.3 10*3/uL (ref 0.0–0.5)
Eosinophils Relative: 2 %
HCT: 39.3 % (ref 39.0–52.0)
Hemoglobin: 11.9 g/dL — ABNORMAL LOW (ref 13.0–17.0)
Immature Granulocytes: 0 %
Lymphocytes Relative: 13 %
Lymphs Abs: 1.8 10*3/uL (ref 0.7–4.0)
MCH: 24.8 pg — ABNORMAL LOW (ref 26.0–34.0)
MCHC: 30.3 g/dL (ref 30.0–36.0)
MCV: 82 fL (ref 80.0–100.0)
Monocytes Absolute: 1.4 10*3/uL — ABNORMAL HIGH (ref 0.1–1.0)
Monocytes Relative: 10 %
Neutro Abs: 10.5 10*3/uL — ABNORMAL HIGH (ref 1.7–7.7)
Neutrophils Relative %: 75 %
Platelets: 237 10*3/uL (ref 150–400)
RBC: 4.79 MIL/uL (ref 4.22–5.81)
RDW: 15.1 % (ref 11.5–15.5)
WBC: 14 10*3/uL — ABNORMAL HIGH (ref 4.0–10.5)
nRBC: 0 % (ref 0.0–0.2)

## 2023-08-03 LAB — I-STAT CG4 LACTIC ACID, ED: Lactic Acid, Venous: 1.4 mmol/L (ref 0.5–1.9)

## 2023-08-03 LAB — RESP PANEL BY RT-PCR (RSV, FLU A&B, COVID)  RVPGX2
Influenza A by PCR: NEGATIVE
Influenza B by PCR: NEGATIVE
Resp Syncytial Virus by PCR: NEGATIVE
SARS Coronavirus 2 by RT PCR: NEGATIVE

## 2023-08-03 MED ORDER — ONDANSETRON HCL 4 MG/2ML IJ SOLN
4.0000 mg | Freq: Once | INTRAMUSCULAR | Status: AC
  Administered 2023-08-03: 4 mg via INTRAVENOUS
  Filled 2023-08-03: qty 2

## 2023-08-03 MED ORDER — IOHEXOL 300 MG/ML  SOLN
100.0000 mL | Freq: Once | INTRAMUSCULAR | Status: AC | PRN
  Administered 2023-08-03: 100 mL via INTRAVENOUS

## 2023-08-03 MED ORDER — SODIUM CHLORIDE 0.9 % IV BOLUS
1000.0000 mL | Freq: Once | INTRAVENOUS | Status: AC
  Administered 2023-08-03: 1000 mL via INTRAVENOUS

## 2023-08-03 MED ORDER — METHOCARBAMOL 500 MG PO TABS
500.0000 mg | ORAL_TABLET | Freq: Three times a day (TID) | ORAL | Status: DC | PRN
  Administered 2023-08-04 – 2023-08-05 (×2): 500 mg via ORAL
  Filled 2023-08-03 (×2): qty 1

## 2023-08-03 MED ORDER — BISACODYL 10 MG RE SUPP: 10.0000 mg | Freq: Every day | RECTAL | Status: DC | PRN

## 2023-08-03 MED ORDER — ACETAMINOPHEN 500 MG PO TABS
1000.0000 mg | ORAL_TABLET | Freq: Four times a day (QID) | ORAL | Status: DC
  Administered 2023-08-03 – 2023-08-06 (×9): 1000 mg via ORAL
  Filled 2023-08-03 (×9): qty 2

## 2023-08-03 MED ORDER — HYDRALAZINE HCL 20 MG/ML IJ SOLN: 10.0000 mg | INTRAMUSCULAR | Status: DC | PRN

## 2023-08-03 MED ORDER — PIPERACILLIN-TAZOBACTAM 3.375 G IVPB
3.3750 g | Freq: Three times a day (TID) | INTRAVENOUS | Status: DC
  Administered 2023-08-03 – 2023-08-05 (×5): 3.375 g via INTRAVENOUS
  Filled 2023-08-03 (×5): qty 50

## 2023-08-03 MED ORDER — POLYETHYLENE GLYCOL 3350 17 G PO PACK
17.0000 g | PACK | Freq: Every day | ORAL | Status: DC | PRN
Start: 2023-08-03 — End: 2023-08-06
  Administered 2023-08-05: 17 g via ORAL
  Filled 2023-08-03: qty 1

## 2023-08-03 MED ORDER — METHOCARBAMOL 1000 MG/10ML IJ SOLN: 500.0000 mg | Freq: Three times a day (TID) | INTRAMUSCULAR | Status: DC | PRN

## 2023-08-03 MED ORDER — HYDROMORPHONE HCL 1 MG/ML IJ SOLN: 0.5000 mg | INTRAMUSCULAR | Status: DC | PRN

## 2023-08-03 MED ORDER — SIMETHICONE 80 MG PO CHEW: 40.0000 mg | CHEWABLE_TABLET | Freq: Four times a day (QID) | ORAL | Status: DC | PRN

## 2023-08-03 MED ORDER — ONDANSETRON HCL 4 MG/2ML IJ SOLN: 4.0000 mg | Freq: Four times a day (QID) | INTRAMUSCULAR | Status: DC | PRN

## 2023-08-03 MED ORDER — ENOXAPARIN SODIUM 40 MG/0.4ML IJ SOSY
40.0000 mg | PREFILLED_SYRINGE | INTRAMUSCULAR | Status: DC
  Administered 2023-08-03: 40 mg via SUBCUTANEOUS
  Filled 2023-08-03: qty 0.4

## 2023-08-03 MED ORDER — PROCHLORPERAZINE EDISYLATE 10 MG/2ML IJ SOLN: 5.0000 mg | Freq: Four times a day (QID) | INTRAMUSCULAR | Status: DC | PRN

## 2023-08-03 MED ORDER — ONDANSETRON 4 MG PO TBDP: 4.0000 mg | ORAL_TABLET | Freq: Four times a day (QID) | ORAL | Status: DC | PRN

## 2023-08-03 MED ORDER — SODIUM CHLORIDE 0.9 % IV SOLN: INTRAVENOUS | Status: DC

## 2023-08-03 MED ORDER — OXYCODONE HCL 5 MG PO TABS: 5.0000 mg | ORAL_TABLET | ORAL | Status: DC | PRN

## 2023-08-03 MED ORDER — DIPHENHYDRAMINE HCL 25 MG PO CAPS: 25.0000 mg | ORAL_CAPSULE | Freq: Four times a day (QID) | ORAL | Status: DC | PRN

## 2023-08-03 MED ORDER — METOPROLOL TARTRATE 5 MG/5ML IV SOLN: 5.0000 mg | Freq: Four times a day (QID) | INTRAVENOUS | Status: DC | PRN

## 2023-08-03 MED ORDER — DIPHENHYDRAMINE HCL 50 MG/ML IJ SOLN: 25.0000 mg | Freq: Four times a day (QID) | INTRAMUSCULAR | Status: DC | PRN

## 2023-08-03 MED ORDER — TRAMADOL HCL 50 MG PO TABS
50.0000 mg | ORAL_TABLET | Freq: Four times a day (QID) | ORAL | Status: DC | PRN
  Administered 2023-08-05 (×2): 50 mg via ORAL
  Filled 2023-08-03 (×2): qty 1

## 2023-08-03 MED ORDER — PROCHLORPERAZINE MALEATE 10 MG PO TABS: 10.0000 mg | ORAL_TABLET | Freq: Four times a day (QID) | ORAL | Status: DC | PRN

## 2023-08-03 MED ORDER — DOCUSATE SODIUM 100 MG PO CAPS
100.0000 mg | ORAL_CAPSULE | Freq: Two times a day (BID) | ORAL | Status: DC
  Administered 2023-08-03 – 2023-08-05 (×5): 100 mg via ORAL
  Filled 2023-08-03 (×5): qty 1

## 2023-08-03 NOTE — H&P (Signed)
Surgical Evaluation  Chief Complaint: fatigue  HPI: 62yo man who underwent open retrorectus incisional hernia repair with 90 min adhesiolysis on 11/18. Postop course complicated by bleeding requiring return to OR and washout on 11/20. He ultimately recovered well and went home on 11/24. His surgical drain is still in place with serous output that only recently decreased to less than 30cc/day, and he is scheduled for a drain check/removal later this week. He has been experiencing more fatigue, dizziness, and low grade fevers over the last few days, reportedly up to 101.5 on Wednesday (5 days ago) at which time he also had some nausea and vomiting. Abdomen feels more swollen. Imaging as below demonstrates fluid/gas collection in the retrorectus space, hemoconcentration/AKI, leukocytosis.   No Known Allergies  Past Medical History:  Diagnosis Date   Anxiety    Cancer [facility])    prostate  2021   Cataract    Depression    GERD (gastroesophageal reflux disease)    Glaucoma    Hypertension     Past Surgical History:  Procedure Laterality Date   COLONOSCOPY  2011   EYE SURGERY     hole repair left eye and detached retina surgery   HERNIA REPAIR     umbilical with mesh   INCISIONAL HERNIA REPAIR N/A 06/30/2023   Procedure: open incisional hernia repair with mesh;  Surgeon: Moise Boring, MD;  Location: [facility];  Service: General;  Laterality: N/A;   LAPAROSCOPIC LYSIS OF ADHESIONS N/A 03/06/2020   Procedure: LAPAROSCOPIC LYSIS OF ADHESIONS;  Surgeon: Crista Elliot, MD;  Location: [facility];  Service: Urology;  Laterality: N/A;   LAPAROTOMY N/A 07/02/2023   Procedure: EXPLORATORY LAPAROTOMY WITH HEMATOMA WASH OUT;  Surgeon: Moise Boring, MD;  Location: [facility];  Service: General;  Laterality: N/A;   PELVIC LYMPH NODE DISSECTION Bilateral 03/06/2020   Procedure: BILATERAL PELVIC LYMPH NODE DISSECTION;  Surgeon: Crista Elliot, MD;  Location: [facility];  Service: Urology;  Laterality:  Bilateral;   ROBOT ASSISTED LAPAROSCOPIC RADICAL PROSTATECTOMY N/A 03/06/2020   Procedure: XI ROBOTIC ASSISTED LAPAROSCOPIC RADICAL PROSTATECTOMY;  Surgeon: Crista Elliot, MD;  Location: [facility];  Service: Urology;  Laterality: N/A;   TONSILLECTOMY      Family History  Problem Relation Age of Onset   Colon cancer Neg Hx    Colon polyps Neg Hx    Esophageal cancer Neg Hx    Rectal cancer Neg Hx    Stomach cancer Neg Hx     Social History   Socioeconomic History   Marital status: Married    Spouse name: Not on file   Number of children: Not on file   Years of education: Not on file   Highest education level: Not on file  Occupational History   Not on file  Tobacco Use   Smoking status: Former    Current packs/day: 0.00    Types: Cigarettes    Start date: 08/12/1968    Quit date: 08/12/1998    Years since quitting: 24.9   Smokeless tobacco: Never  Vaping Use   Vaping status: Never Used  Substance and Sexual Activity   Alcohol use: Yes    Comment: occasional   Drug use: No   Sexual activity: Yes    Birth control/protection: None  Other Topics Concern   Not on file  Social History Narrative   Not on file   Social Drivers of Health   Financial Resource Strain: Not on file  Food Insecurity: No Food Insecurity (07/16/2023)   Hunger Vital Sign    Worried About Running Out of Food in the Last Year: Never true    Ran Out of Food in the Last Year: Never true  Recent Concern: Food Insecurity - Food Insecurity Present (06/30/2023)   Hunger Vital Sign    Worried About Running Out of Food in the Last Year: Never true    Ran Out of Food in the Last Year: Sometimes true  Transportation Needs: No Transportation Needs (06/30/2023)   PRAPARE - Administrator, Civil Service (Medical): No    Lack of Transportation (Non-Medical): No  Physical Activity: Not on file  Stress: Not on file  Social Connections: Not on file    No current facility-administered medications  on file prior to encounter.   Current Outpatient Medications on File Prior to Encounter  Medication Sig Dispense Refill   amLODipine (NORVASC) 5 MG tablet TAKE 1 TABLET (5 MG TOTAL) BY MOUTH DAILY. 90 tablet 1   buPROPion (WELLBUTRIN XL) 150 MG 24 hr tablet Take 1 tablet (150 mg total) by mouth in the morning and at bedtime. 180 tablet 1   Cholecalciferol (VITAMIN D) 50 MCG (2000 UT) tablet Take 2,000 Units by mouth daily.     Cyanocobalamin (B-12) 2500 MCG TABS Take 2,500 mcg by mouth daily.     cyclobenzaprine (FLEXERIL) 10 MG tablet Take 10 mg by mouth 3 (three) times daily as needed for muscle spasms.     Iron, Ferrous Sulfate, 325 (65 Fe) MG TABS Take 325 mg by mouth every other day. 45 tablet 3   lisinopril (ZESTRIL) 20 MG tablet TAKE 1 TABLET BY MOUTH EVERY DAY 90 tablet 3   Magnesium 250 MG CAPS Take 250 mg by mouth daily.     Multiple Vitamin (MULTIVITAMIN WITH MINERALS) TABS tablet Take 1 tablet by mouth daily.     Omega 3-6-9 Fatty Acids (TRIPLE OMEGA COMPLEX PO) Take 1 capsule by mouth daily.     zinc gluconate 50 MG tablet Take 50 mg by mouth daily.      Review of Systems: a complete, 10pt review of systems was completed with pertinent positives and negatives as documented in the HPI  Physical Exam: Vitals:   08/03/23 1915 08/03/23 1919  BP: (!) 147/98   Pulse: 95   Resp: 19   Temp:  98.7 F (37.1 C)  SpO2: 100%    Gen: A&Ox3, no distress  Unlabored respirations Abd soft, mildly distended.  Not really tender and he reports he really does not have much sensation along the anterior abdomen.  Incision is healing with Dermabond still in place, no erythema, induration, tenderness or warmth.  Drain output is serous. Psych: appropriate mood and affect, normal insight/judgment intact  Skin: warm and dry      Latest Ref Rng & Units 08/03/2023    4:42 PM 07/06/2023    4:49 AM 07/05/2023    5:34 AM  CBC  WBC 4.0 - 10.5 K/uL 14.0  11.1  12.9   Hemoglobin 13.0 - 17.0 g/dL  01.0  9.8  9.6   Hematocrit 39.0 - 52.0 % 39.3  30.4  30.5   Platelets 150 - 400 K/uL 237  263  235        Latest Ref Rng & Units 08/03/2023    4:42 PM 07/06/2023    4:49 AM 07/05/2023    5:34 AM  CMP  Glucose 70 - 99 mg/dL 272  536  89   BUN 8 - 23 mg/dL 15  14  14    Creatinine 0.61 - 1.24 mg/dL 1.61  0.96  0.45   Sodium 135 - 145 mmol/L 136  135  139   Potassium 3.5 - 5.1 mmol/L 3.8  3.5  4.1   Chloride 98 - 111 mmol/L 105  104  108   CO2 22 - 32 mmol/L 24  25  25    Calcium 8.9 - 10.3 mg/dL 8.3  7.9  8.3   Total Protein 6.5 - 8.1 g/dL 6.7     Total Bilirubin <1.2 mg/dL 0.3     Alkaline Phos 38 - 126 U/L 55     AST 15 - 41 U/L 18     ALT 0 - 44 U/L 19       Lab Results  Component Value Date   INR 1.0 02/25/2020    Imaging: No results found.   A/P: Abdominal wall abscess. Dehydration/Acute kidney injury.  Will admit for fluid resuscitation, antibiotics, and consult IR for drain placement. Surgical drain can likely be removed- will defer to Dr. Hillery Hunter Hold home BP meds for now given AKI.  I reviewed the images and showed them to his wife.  Patient and his family's questions were welcomed and answered to their satisfaction.    Patient Active Problem List   Diagnosis Date Noted   Abdominal wall abscess 08/03/2023   Incisional hernia 06/30/2023   Work-related stress 05/05/2023   History of prostate cancer 03/25/2023   Degenerative arthritis of right knee 08/16/2022   Dysthymia 04/23/2021   Iron deficiency 04/23/2021   Elevated glucose 04/12/2020   Anemia 04/12/2020   Paresthesias 04/12/2020   Abdominal pain 03/12/2020   Prostate cancer (HCC) 03/06/2020   Increased prostate specific antigen (PSA) velocity 10/12/2018   Dupuytren contracture 10/08/2018   Screening for cholesterol level 10/08/2018   Erectile dysfunction due to arterial insufficiency 10/08/2018   Hip flexor tendon tightness, right 07/15/2017   Synovitis of right knee 06/24/2017   Lateral meniscal  tear 06/24/2017   Right knee pain 01/07/2017   Midline low back pain without sciatica 04/22/2016   Acute bronchitis 10/03/2014   Plantar fasciitis of right foot 09/27/2014   Subacromial bursitis 08/16/2014   Umbilical hernia 08/20/2010   Prostatitis 05/17/2008   Essential hypertension 12/26/2006   Allergic rhinitis 12/26/2006       Phylliss Blakes, MD [facility]  See AMION to contact appropriate on-call provider

## 2023-08-03 NOTE — ED Provider Notes (Signed)
[place] EMERGENCY DEPARTMENT AT [facility] Provider Note   CSN: [id number] Arrival date & time: 08/03/23  1606     History  Chief Complaint  Patient presents with   Fatigue   Dizziness   Post-op Problem    Jonathan Edwards is a 62 y.o. male.   Dizziness Patient presents with dizziness and weakness.  November 18 had ventral hernia repair complicated by bleeding.  Had second operation on the 20th.  Had been doing well.  Has a JP drain which has now had under 30 cc drainage for the third day.  However feeling more fatigue and dizziness.  Somewhat decreased bowel movements.  Reported fever over the last few days.  On Wednesday up to 103.  Today is Sunday.  No cough but states throat does slightly hurt.  No definite sick contacts.  No urinary symptoms.  States abdomen does feel more swollen.    Past Medical History:  Diagnosis Date   Anxiety    Cancer [facility])    prostate  2021   Cataract    Depression    GERD (gastroesophageal reflux disease)    Glaucoma    Hypertension     Home Medications Prior to Admission medications   Medication Sig Start Date End Date Taking? Authorizing Provider  amLODipine (NORVASC) 5 MG tablet TAKE 1 TABLET (5 MG TOTAL) BY MOUTH DAILY. 07/14/23  Yes Mliss Sax, MD  buPROPion (WELLBUTRIN XL) 150 MG 24 hr tablet Take 1 tablet (150 mg total) by mouth in the morning and at bedtime. 05/05/23  Yes Mliss Sax, MD  Cholecalciferol (VITAMIN D) 50 MCG (2000 UT) tablet Take 2,000 Units by mouth daily.   Yes [provider]  Cyanocobalamin (B-12) 2500 MCG TABS Take 2,500 mcg by mouth daily.   Yes [provider]  cyclobenzaprine (FLEXERIL) 10 MG tablet Take 10 mg by mouth 3 (three) times daily as needed for muscle spasms.   Yes [provider]  Iron, Ferrous Sulfate, 325 (65 Fe) MG TABS Take 325 mg by mouth every other day. 03/26/23  Yes Mliss Sax, MD  lisinopril (ZESTRIL) 20 MG tablet TAKE  1 TABLET BY MOUTH EVERY DAY 07/14/23  Yes Mliss Sax, MD  Magnesium 250 MG CAPS Take 250 mg by mouth daily.   Yes [provider]  Multiple Vitamin (MULTIVITAMIN WITH MINERALS) TABS tablet Take 1 tablet by mouth daily.   Yes [provider]  Omega 3-6-9 Fatty Acids (TRIPLE OMEGA COMPLEX PO) Take 1 capsule by mouth daily.   Yes [provider]  zinc gluconate 50 MG tablet Take 50 mg by mouth daily.   Yes [provider]      Allergies    Patient has no known allergies.    Review of Systems   Review of Systems  Neurological:  Positive for dizziness.    Physical Exam Updated Vital Signs BP (!) 141/84 (BP Location: Right Arm)   Pulse 99   Temp 99.3 F (37.4 C) (Oral)   Resp 16   Ht 6\' 2"  (1.88 m)   Wt 107.5 kg   SpO2 98%   BMI 30.43 kg/m  Physical Exam Vitals and nursing note reviewed.  HENT:     Head: Normocephalic.     Mouth/Throat:     Pharynx: No oropharyngeal exudate or posterior oropharyngeal erythema.  Cardiovascular:     Rate and Rhythm: Regular rhythm. Tachycardia present.  Pulmonary:     Breath sounds: No wheezing or  rhonchi.  Abdominal:     Tenderness: There is abdominal tenderness.     Comments: Midline surgical site well-healing.  Left lower quadrant drain site without signs of infection.  Mild diffuse abdominal tenderness.  Skin:    Capillary Refill: Capillary refill takes less than 2 seconds.  Neurological:     Mental Status: He is alert and oriented to person, place, and time.     ED Results / Procedures / Treatments   Labs (all labs ordered are listed, but only abnormal results are displayed) Labs Reviewed  COMPREHENSIVE METABOLIC PANEL - Abnormal; Notable for the following components:      Result Value   Glucose, Bld 121 (*)    Creatinine, Ser 1.27 (*)    Calcium 8.3 (*)    Albumin 3.3 (*)    All other components within normal limits  CBC WITH DIFFERENTIAL/PLATELET - Abnormal; Notable for the  following components:   WBC 14.0 (*)    Hemoglobin 11.9 (*)    MCH 24.8 (*)    Neutro Abs 10.5 (*)    Monocytes Absolute 1.4 (*)    All other components within normal limits  RESP PANEL BY RT-PCR (RSV, FLU A&B, COVID)  [id number]  URINALYSIS, W/ REFLEX TO CULTURE (INFECTION SUSPECTED)  BASIC METABOLIC PANEL  MAGNESIUM  CBC  I-STAT CG4 LACTIC ACID, ED  I-STAT CG4 LACTIC ACID, ED    EKG EKG Interpretation Date/Time:  Sunday August 03 2023 16:20:16 EST Ventricular Rate:  97 PR Interval:  163 QRS Duration:  85 QT Interval:  327 QTC Calculation: 416 R Axis:   -20  Text Interpretation: duplicate Confirmed by Benjiman Core [phone]) on 08/03/2023 5:33:30 PM  Radiology CT ABDOMEN PELVIS W CONTRAST Result Date: 08/03/2023 CLINICAL DATA:  Postoperative fever the patient underwent hernia repair in November 2024. EXAM: CT ABDOMEN AND PELVIS WITH CONTRAST TECHNIQUE: Multidetector CT imaging of the abdomen and pelvis was performed using the standard protocol following bolus administration of intravenous contrast. RADIATION DOSE REDUCTION: This exam was performed according to the departmental dose-optimization program which includes automated exposure control, adjustment of the mA and/or kV according to patient size and/or use of iterative reconstruction technique. CONTRAST:  OMNIPAQUE IOHEXOL 300 MG/ML  SOLN COMPARISON:  CT abdomen pelvis dated 04/03/2023. FINDINGS: Lower chest: No acute abnormality. Hepatobiliary: Hepatic cysts are redemonstrated. No imaging follow-up is recommended for this finding. No gallstones, gallbladder wall thickening, or biliary dilatation. Pancreas: Unremarkable. No pancreatic ductal dilatation or surrounding inflammatory changes. Spleen: Normal in size without focal abnormality. Adrenals/Urinary Tract: Adrenal glands are unremarkable. Kidneys are normal, without renal calculi, focal lesion, or hydronephrosis. Bladder is incompletely distended. Stomach/Bowel:  Stomach is within normal limits. Appendix appears normal. No evidence of bowel wall thickening, distention, or inflammatory changes. Vascular/Lymphatic: Aortic atherosclerosis. No enlarged abdominal or pelvic lymph nodes. Reproductive: Prostate is surgically absent. Other: A rim enhancing gas and fluid collection in the upper midline anterior abdominal wall measures 12.5 x 3.3 x 14.8 cm in size. This is most consistent with an abscess. A surgical drain enters the abdominal wall inferior and to the left of this abscess, then crosses midline and extends superiorly along the right lateral aspect of the fluid collection. The surgical drain does not appear to communicate with the fluid collection. No abdominal wall hernia. Musculoskeletal: Degenerative changes are seen in the spine. IMPRESSION: Abscess in the upper midline anterior abdominal wall. A surgical drain does not appear to communicate with the fluid collection. Aortic Atherosclerosis (ICD10-I70.0). Electronically Signed  By: Romona Curls M.D.   On: 08/03/2023 20:05   DG Chest 2 View Result Date: 08/03/2023 CLINICAL DATA:  Fever and chills. EXAM: CHEST - 2 VIEW COMPARISON:  None Available. FINDINGS: The heart size and mediastinal contours are within normal limits. Both lungs are clear. The visualized skeletal structures are unremarkable. IMPRESSION: No active cardiopulmonary disease. Electronically Signed   By: Elgie Collard M.D.   On: 08/03/2023 16:49    Procedures Procedures    Medications Ordered in ED Medications  enoxaparin (LOVENOX) injection 40 mg (40 mg Subcutaneous Given 08/03/23 2238)  0.9 %  sodium chloride infusion ( Intravenous Restarted 08/03/23 2308)  piperacillin-tazobactam (ZOSYN) IVPB 3.375 g (3.375 g Intravenous New Bag/Given 08/03/23 2238)  acetaminophen (TYLENOL) tablet 1,000 mg (has no administration in time range)  traMADol (ULTRAM) tablet 50 mg (has no administration in time range)  oxyCODONE (Oxy IR/ROXICODONE)  immediate release tablet 5-10 mg (has no administration in time range)  HYDROmorphone (DILAUDID) injection 0.5 mg (has no administration in time range)  methocarbamol (ROBAXIN) tablet 500 mg (has no administration in time range)    Or  methocarbamol (ROBAXIN) injection 500 mg (has no administration in time range)  diphenhydrAMINE (BENADRYL) capsule 25 mg (has no administration in time range)    Or  diphenhydrAMINE (BENADRYL) injection 25 mg (has no administration in time range)  docusate sodium (COLACE) capsule 100 mg (100 mg Oral Given 08/03/23 2238)  polyethylene glycol (MIRALAX / GLYCOLAX) packet 17 g (has no administration in time range)  bisacodyl (DULCOLAX) suppository 10 mg (has no administration in time range)  ondansetron (ZOFRAN-ODT) disintegrating tablet 4 mg (has no administration in time range)    Or  ondansetron (ZOFRAN) injection 4 mg (has no administration in time range)  prochlorperazine (COMPAZINE) tablet 10 mg (has no administration in time range)    Or  prochlorperazine (COMPAZINE) injection 5-10 mg (has no administration in time range)  simethicone (MYLICON) chewable tablet 40 mg (has no administration in time range)  metoprolol tartrate (LOPRESSOR) injection 5 mg (has no administration in time range)  hydrALAZINE (APRESOLINE) injection 10 mg (has no administration in time range)  iohexol (OMNIPAQUE) 300 MG/ML solution 100 mL (100 mLs Intravenous Contrast Given 08/03/23 1938)  ondansetron (ZOFRAN) injection 4 mg (4 mg Intravenous Given 08/03/23 1918)  sodium chloride 0.9 % bolus 1,000 mL (1,000 mLs Intravenous Bolus 08/03/23 2041)    ED Course/ Medical Decision Making/ A&P                                 Medical Decision Making Amount and/or Complexity of Data Reviewed Labs: ordered. Radiology: ordered.  Risk Prescription drug management. Decision regarding hospitalization.   Patient with fever dizziness with relatively recent surgery.  Differential  diagnosis includes infection including intra-abdominal infection.  Also URI type symptoms with his slightly sore throat.  Will get chest x-ray urinalysis basic blood work.  White count is now 14 up from 11 4 weeks ago.  Hemoglobin is 11.9 which is up from 9.8 at time of discharge.  Will get CT scan to evaluate.  I reviewed discharge note from recent admission.  CT scan shows fluid collection, potentially abscess.  Discussed with Dr. Doylene Canard who saw patient and will admit.         Final Clinical Impression(s) / ED Diagnoses Final diagnoses:  Abdominal wall abscess at site of surgical wound    Rx / DC Orders ED  Discharge Orders     None         Benjiman Core, MD 08/03/23 2315

## 2023-08-03 NOTE — ED Notes (Signed)
ED TO INPATIENT HANDOFF REPORT  ED Nurse Name and Phone #: Deon Pilling [phone]  S Name/Age/Gender Jonathan Edwards 62 y.o. male Room/Bed: WA08/WA08  Code Status   Code Status: Full Code  Home/SNF/Other Home Patient oriented to: self, place, time, and situation Is this baseline? Yes   Triage Complete: Triage complete  Chief Complaint Abdominal wall abscess [L02.211]  Triage Note Pt arrives via POV. Pt had hernia repair on the the 18th of November. On the 20th of November, he had to have surgery again due to blood loss. Today patient reports fatigue, dizziness, and having a foggy type feeling. Reports fever of 102 this past Wednesday. Pt still has a drain in his abdomen. Pt AxOx4.    Allergies No Known Allergies  Level of Care/Admitting Diagnosis ED Disposition     ED Disposition  Admit   Condition  --   Comment  Hospital Area: [facility] [[id number]]  Level of Care: Med-Surg [16]  May admit patient to [facility] or [facility] if equivalent level of care is available:: No  Covid Evaluation: Asymptomatic - no recent exposure (last 10 days) testing not required  Diagnosis: Abdominal wall abscess [[id number]]  Admitting Physician: Moise Boring [[id number]]  Attending Physician: Moise Boring [[id number]]  Certification:: I certify this patient will need inpatient services for at least 2 midnights  Expected Medical Readiness: 08/06/2023          B Medical/Surgery History Past Medical History:  Diagnosis Date   Anxiety    Cancer (HCC)    prostate  2021   Cataract    Depression    GERD (gastroesophageal reflux disease)    Glaucoma    Hypertension    Past Surgical History:  Procedure Laterality Date   COLONOSCOPY  2011   EYE SURGERY     hole repair left eye and detached retina surgery   HERNIA REPAIR     umbilical with mesh   INCISIONAL HERNIA REPAIR N/A 06/30/2023   Procedure: open incisional hernia repair with mesh;  Surgeon: Moise Boring, MD;  Location: [facility];  Service: General;  Laterality: N/A;   LAPAROSCOPIC LYSIS OF ADHESIONS N/A 03/06/2020   Procedure: LAPAROSCOPIC LYSIS OF ADHESIONS;  Surgeon: Crista Elliot, MD;  Location: [facility];  Service: Urology;  Laterality: N/A;   LAPAROTOMY N/A 07/02/2023   Procedure: EXPLORATORY LAPAROTOMY WITH HEMATOMA WASH OUT;  Surgeon: Moise Boring, MD;  Location: [facility];  Service: General;  Laterality: N/A;   PELVIC LYMPH NODE DISSECTION Bilateral 03/06/2020   Procedure: BILATERAL PELVIC LYMPH NODE DISSECTION;  Surgeon: Crista Elliot, MD;  Location: [facility];  Service: Urology;  Laterality: Bilateral;   ROBOT ASSISTED LAPAROSCOPIC RADICAL PROSTATECTOMY N/A 03/06/2020   Procedure: XI ROBOTIC ASSISTED LAPAROSCOPIC RADICAL PROSTATECTOMY;  Surgeon: Crista Elliot, MD;  Location: [facility];  Service: Urology;  Laterality: N/A;   TONSILLECTOMY       A IV Location/Drains/Wounds Patient Lines/Drains/Airways Status     Active Line/Drains/Airways     Name Placement date Placement time Site Days   Peripheral IV 08/03/23 20 G Left Antecubital 08/03/23  1718  Antecubital  less than 1   Closed System Drain 1 Left Abdomen Bulb (JP) 19 Fr. 06/30/23  1026  Abdomen  34   Incision - 6 Ports Abdomen 1: Left;Lateral;Lower 2: Left;Lateral;Upper 3: Umbilicus;Superior 4: Right;Lateral;Lower 5: Right;Medial;Upper 6: Right;Lateral;Upper 03/06/20  --  -- 1245  Intake/Output Last 24 hours No intake or output data in the 24 hours ending 08/03/23 2216  Labs/Imaging Results for orders placed or performed during the hospital encounter of 08/03/23 (from the past 48 hours)  Comprehensive metabolic panel     Status: Abnormal   Collection Time: 08/03/23  4:42 PM  Result Value Ref Range   Sodium 136 135 - 145 mmol/L   Potassium 3.8 3.5 - 5.1 mmol/L   Chloride 105 98 - 111 mmol/L   CO2 24 22 - 32 mmol/L   Glucose, Bld 121 (H) 70 - 99 mg/dL    Comment: Glucose reference range applies  only to samples taken after fasting for at least 8 hours.   BUN 15 8 - 23 mg/dL   Creatinine, Ser 1.61 (H) 0.61 - 1.24 mg/dL   Calcium 8.3 (L) 8.9 - 10.3 mg/dL   Total Protein 6.7 6.5 - 8.1 g/dL   Albumin 3.3 (L) 3.5 - 5.0 g/dL   AST 18 15 - 41 U/L   ALT 19 0 - 44 U/L   Alkaline Phosphatase 55 38 - 126 U/L   Total Bilirubin 0.3 <1.2 mg/dL   GFR, Estimated >09 >60 mL/min    Comment: (NOTE) Calculated using the CKD-EPI Creatinine Equation (2021)    Anion gap 7 5 - 15    Comment: Performed at [facility], [address] [place], [place] [address]  CBC with Differential     Status: Abnormal   Collection Time: 08/03/23  4:42 PM  Result Value Ref Range   WBC 14.0 (H) 4.0 - 10.5 K/uL   RBC 4.79 4.22 - 5.81 MIL/uL   Hemoglobin 11.9 (L) 13.0 - 17.0 g/dL   HCT 81.1 91.4 - 78.2 %   MCV 82.0 80.0 - 100.0 fL   MCH 24.8 (L) 26.0 - 34.0 pg   MCHC 30.3 30.0 - 36.0 g/dL   RDW 95.6 21.3 - 08.6 %   Platelets 237 150 - 400 K/uL   nRBC 0.0 0.0 - 0.2 %   Neutrophils Relative % 75 %   Neutro Abs 10.5 (H) 1.7 - 7.7 K/uL   Lymphocytes Relative 13 %   Lymphs Abs 1.8 0.7 - 4.0 K/uL   Monocytes Relative 10 %   Monocytes Absolute 1.4 (H) 0.1 - 1.0 K/uL   Eosinophils Relative 2 %   Eosinophils Absolute 0.3 0.0 - 0.5 K/uL   Basophils Relative 0 %   Basophils Absolute 0.1 0.0 - 0.1 K/uL   Immature Granulocytes 0 %   Abs Immature Granulocytes 0.06 0.00 - 0.07 K/uL    Comment: Performed at [facility], [address], [place], [place] [address]  Resp panel by RT-PCR (RSV, Flu A&B, Covid) Anterior Nasal Swab     Status: None   Collection Time: 08/03/23  4:42 PM   Specimen: Anterior Nasal Swab  Result Value Ref Range   SARS Coronavirus 2 by RT PCR NEGATIVE NEGATIVE    Comment: (NOTE) SARS-CoV-2 target nucleic acids are NOT DETECTED.  The SARS-CoV-2 RNA is generally detectable in upper respiratory specimens during the acute phase of infection. The  lowest concentration of SARS-CoV-2 viral copies this assay can detect is 138 copies/mL. A negative result does not preclude SARS-Cov-2 infection and should not be used as the sole basis for treatment or other patient management decisions. A negative result may occur with  improper specimen collection/handling, submission of specimen other than nasopharyngeal swab, presence of viral mutation(s) within the areas targeted by  this assay, and inadequate number of viral copies(<138 copies/mL). A negative result must be combined with clinical observations, patient history, and epidemiological information. The expected result is Negative.  Fact Sheet for Patients:  BloggerCourse.com  Fact Sheet for Healthcare Providers:  SeriousBroker.it  This test is no t yet approved or cleared by the [place] FDA and  has been authorized for detection and/or diagnosis of SARS-CoV-2 by FDA under an Emergency Use Authorization (EUA). This EUA will remain  in effect (meaning this test can be used) for the duration of the COVID-19 declaration under Section 564(b)(1) of the Act, 21 U.S.C.section 360bbb-3(b)(1), unless the authorization is terminated  or revoked sooner.       Influenza A by PCR NEGATIVE NEGATIVE   Influenza B by PCR NEGATIVE NEGATIVE    Comment: (NOTE) The Xpert Xpress SARS-CoV-2/FLU/RSV plus assay is intended as an aid in the diagnosis of influenza from Nasopharyngeal swab specimens and should not be used as a sole basis for treatment. Nasal washings and aspirates are unacceptable for Xpert Xpress SARS-CoV-2/FLU/RSV testing.  Fact Sheet for Patients: BloggerCourse.com  Fact Sheet for Healthcare Providers: SeriousBroker.it  This test is not yet approved or cleared by the [place] FDA and has been authorized for detection and/or diagnosis of SARS-CoV-2 by FDA under an Emergency  Use Authorization (EUA). This EUA will remain in effect (meaning this test can be used) for the duration of the COVID-19 declaration under Section 564(b)(1) of the Act, 21 U.S.C. section 360bbb-3(b)(1), unless the authorization is terminated or revoked.     Resp Syncytial Virus by PCR NEGATIVE NEGATIVE    Comment: (NOTE) Fact Sheet for Patients: BloggerCourse.com  Fact Sheet for Healthcare Providers: SeriousBroker.it  This test is not yet approved or cleared by the [place] FDA and has been authorized for detection and/or diagnosis of SARS-CoV-2 by FDA under an Emergency Use Authorization (EUA). This EUA will remain in effect (meaning this test can be used) for the duration of the COVID-19 declaration under Section 564(b)(1) of the Act, 21 U.S.C. section 360bbb-3(b)(1), unless the authorization is terminated or revoked.  Performed at [facility], [address], [place], [place] [address]   I-Stat Lactic Acid, ED     Status: None   Collection Time: 08/03/23  4:53 PM  Result Value Ref Range   Lactic Acid, Venous 1.4 0.5 - 1.9 mmol/L  Urinalysis, w/ Reflex to Culture (Infection Suspected) -Urine, Clean Catch     Status: None   Collection Time: 08/03/23  7:26 PM  Result Value Ref Range   Specimen Source URINE, CLEAN CATCH    Color, Urine YELLOW YELLOW   APPearance CLEAR CLEAR   Specific Gravity, Urine 1.020 1.005 - 1.030   pH 6.0 5.0 - 8.0   Glucose, UA NEGATIVE NEGATIVE mg/dL   Hgb urine dipstick NEGATIVE NEGATIVE   Bilirubin Urine NEGATIVE NEGATIVE   Ketones, ur NEGATIVE NEGATIVE mg/dL   Protein, ur NEGATIVE NEGATIVE mg/dL   Nitrite NEGATIVE NEGATIVE   Leukocytes,Ua NEGATIVE NEGATIVE   RBC / HPF 0-5 0 - 5 RBC/hpf   WBC, UA 0-5 0 - 5 WBC/hpf    Comment:        Reflex urine culture not performed if WBC <=10, OR if Squamous epithelial cells >5. If Squamous epithelial cells >5 suggest  recollection.    Bacteria, UA NONE SEEN NONE SEEN   Squamous Epithelial / HPF 0-5 0 - 5 /HPF   Mucus PRESENT     Comment: Performed  at [facility], [address], [place], [place] [address]   CT ABDOMEN PELVIS W CONTRAST Result Date: 08/03/2023 CLINICAL DATA:  Postoperative fever the patient underwent hernia repair in November 2024. EXAM: CT ABDOMEN AND PELVIS WITH CONTRAST TECHNIQUE: Multidetector CT imaging of the abdomen and pelvis was performed using the standard protocol following bolus administration of intravenous contrast. RADIATION DOSE REDUCTION: This exam was performed according to the departmental dose-optimization program which includes automated exposure control, adjustment of the mA and/or kV according to patient size and/or use of iterative reconstruction technique. CONTRAST:  OMNIPAQUE IOHEXOL 300 MG/ML  SOLN COMPARISON:  CT abdomen pelvis dated 04/03/2023. FINDINGS: Lower chest: No acute abnormality. Hepatobiliary: Hepatic cysts are redemonstrated. No imaging follow-up is recommended for this finding. No gallstones, gallbladder wall thickening, or biliary dilatation. Pancreas: Unremarkable. No pancreatic ductal dilatation or surrounding inflammatory changes. Spleen: Normal in size without focal abnormality. Adrenals/Urinary Tract: Adrenal glands are unremarkable. Kidneys are normal, without renal calculi, focal lesion, or hydronephrosis. Bladder is incompletely distended. Stomach/Bowel: Stomach is within normal limits. Appendix appears normal. No evidence of bowel wall thickening, distention, or inflammatory changes. Vascular/Lymphatic: Aortic atherosclerosis. No enlarged abdominal or pelvic lymph nodes. Reproductive: Prostate is surgically absent. Other: A rim enhancing gas and fluid collection in the upper midline anterior abdominal wall measures 12.5 x 3.3 x 14.8 cm in size. This is most consistent with an abscess. A surgical drain enters the abdominal wall  inferior and to the left of this abscess, then crosses midline and extends superiorly along the right lateral aspect of the fluid collection. The surgical drain does not appear to communicate with the fluid collection. No abdominal wall hernia. Musculoskeletal: Degenerative changes are seen in the spine. IMPRESSION: Abscess in the upper midline anterior abdominal wall. A surgical drain does not appear to communicate with the fluid collection. Aortic Atherosclerosis (ICD10-I70.0). Electronically Signed   By: Romona Curls M.D.   On: 08/03/2023 20:05   DG Chest 2 View Result Date: 08/03/2023 CLINICAL DATA:  Fever and chills. EXAM: CHEST - 2 VIEW COMPARISON:  None Available. FINDINGS: The heart size and mediastinal contours are within normal limits. Both lungs are clear. The visualized skeletal structures are unremarkable. IMPRESSION: No active cardiopulmonary disease. Electronically Signed   By: Elgie Collard M.D.   On: 08/03/2023 16:49    Pending Labs Unresulted Labs (From admission, onward)     Start     Ordered   08/04/23 0500  Basic metabolic panel  Tomorrow morning,   R        08/03/23 2128   08/04/23 0500  Magnesium  Tomorrow morning,   R        08/03/23 2128   08/04/23 0500  CBC  Tomorrow morning,   R        08/03/23 2128            Vitals/Pain Today's Vitals   08/03/23 1615 08/03/23 1627 08/03/23 1915 08/03/23 1919  BP: (!) 138/102  (!) 147/98   Pulse: (!) 103  95   Resp: 18  19   Temp: 99 F (37.2 C)   98.7 F (37.1 C)  TempSrc: Oral   Oral  SpO2: 100%  100%   Weight: 107.5 kg     Height: 6\' 2"  (1.88 m)     PainSc:  0-No pain      Isolation Precautions No active isolations  Medications Medications  enoxaparin (LOVENOX) injection 40 mg (has no administration in time range)  0.9 %  sodium chloride infusion (has no administration in time range)  piperacillin-tazobactam (ZOSYN) IVPB 3.375 g (has no administration in time range)  acetaminophen (TYLENOL) tablet  1,000 mg (has no administration in time range)  traMADol (ULTRAM) tablet 50 mg (has no administration in time range)  oxyCODONE (Oxy IR/ROXICODONE) immediate release tablet 5-10 mg (has no administration in time range)  HYDROmorphone (DILAUDID) injection 0.5 mg (has no administration in time range)  methocarbamol (ROBAXIN) tablet 500 mg (has no administration in time range)    Or  methocarbamol (ROBAXIN) injection 500 mg (has no administration in time range)  diphenhydrAMINE (BENADRYL) capsule 25 mg (has no administration in time range)    Or  diphenhydrAMINE (BENADRYL) injection 25 mg (has no administration in time range)  docusate sodium (COLACE) capsule 100 mg (has no administration in time range)  polyethylene glycol (MIRALAX / GLYCOLAX) packet 17 g (has no administration in time range)  bisacodyl (DULCOLAX) suppository 10 mg (has no administration in time range)  ondansetron (ZOFRAN-ODT) disintegrating tablet 4 mg (has no administration in time range)    Or  ondansetron (ZOFRAN) injection 4 mg (has no administration in time range)  prochlorperazine (COMPAZINE) tablet 10 mg (has no administration in time range)    Or  prochlorperazine (COMPAZINE) injection 5-10 mg (has no administration in time range)  simethicone (MYLICON) chewable tablet 40 mg (has no administration in time range)  metoprolol tartrate (LOPRESSOR) injection 5 mg (has no administration in time range)  hydrALAZINE (APRESOLINE) injection 10 mg (has no administration in time range)  iohexol (OMNIPAQUE) 300 MG/ML solution 100 mL (100 mLs Intravenous Contrast Given 08/03/23 1938)  ondansetron (ZOFRAN) injection 4 mg (4 mg Intravenous Given 08/03/23 1918)  sodium chloride 0.9 % bolus 1,000 mL (1,000 mLs Intravenous Bolus 08/03/23 2041)    Mobility walks     Focused Assessments    R Recommendations: See Admitting Provider Note  Report given to:   Additional Notes:

## 2023-08-03 NOTE — ED Triage Notes (Signed)
Pt arrives via POV. Pt had hernia repair on the the 18th of November. On the 20th of November, he had to have surgery again due to blood loss. Today patient reports fatigue, dizziness, and having a foggy type feeling. Reports fever of 102 this past Wednesday. Pt still has a drain in his abdomen. Pt AxOx4.

## 2023-08-04 ENCOUNTER — Inpatient Hospital Stay (HOSPITAL_COMMUNITY): Payer: Federal, State, Local not specified - PPO

## 2023-08-04 DIAGNOSIS — L02211 Cutaneous abscess of abdominal wall: Secondary | ICD-10-CM | POA: Diagnosis not present

## 2023-08-04 LAB — CBC
HCT: 34.8 % — ABNORMAL LOW (ref 39.0–52.0)
Hemoglobin: 10.5 g/dL — ABNORMAL LOW (ref 13.0–17.0)
MCH: 24.9 pg — ABNORMAL LOW (ref 26.0–34.0)
MCHC: 30.2 g/dL (ref 30.0–36.0)
MCV: 82.5 fL (ref 80.0–100.0)
Platelets: 226 10*3/uL (ref 150–400)
RBC: 4.22 MIL/uL (ref 4.22–5.81)
RDW: 15.4 % (ref 11.5–15.5)
WBC: 11.3 10*3/uL — ABNORMAL HIGH (ref 4.0–10.5)
nRBC: 0 % (ref 0.0–0.2)

## 2023-08-04 LAB — BASIC METABOLIC PANEL
Anion gap: 7 (ref 5–15)
BUN: 12 mg/dL (ref 8–23)
CO2: 23 mmol/L (ref 22–32)
Calcium: 7.6 mg/dL — ABNORMAL LOW (ref 8.9–10.3)
Chloride: 108 mmol/L (ref 98–111)
Creatinine, Ser: 1.1 mg/dL (ref 0.61–1.24)
GFR, Estimated: >60 mL/min (ref >60)
Glucose, Bld: 123 mg/dL — ABNORMAL HIGH (ref 70–99)
Potassium: 3.6 mmol/L (ref 3.5–5.1)
Sodium: 138 mmol/L (ref 135–145)

## 2023-08-04 LAB — PROTIME-INR
INR: 1.1 (ref 0.8–1.2)
Prothrombin Time: 14.8 s (ref 11.4–15.2)

## 2023-08-04 LAB — MAGNESIUM: Magnesium: 2.3 mg/dL (ref 1.7–2.4)

## 2023-08-04 MED ORDER — FLUMAZENIL 0.5 MG/5ML IV SOLN
INTRAVENOUS | Status: AC
  Filled 2023-08-04: qty 5

## 2023-08-04 MED ORDER — SODIUM CHLORIDE 0.9% FLUSH
5.0000 mL | Freq: Three times a day (TID) | INTRAVENOUS | Status: DC
  Administered 2023-08-04 – 2023-08-06 (×6): 5 mL

## 2023-08-04 MED ORDER — MIDAZOLAM HCL 2 MG/2ML IJ SOLN
INTRAMUSCULAR | Status: AC
Start: 2023-08-04 — End: ?
  Filled 2023-08-04: qty 6

## 2023-08-04 MED ORDER — NALOXONE HCL 0.4 MG/ML IJ SOLN
INTRAMUSCULAR | Status: AC
  Filled 2023-08-04: qty 1

## 2023-08-04 MED ORDER — FENTANYL CITRATE (PF) 100 MCG/2ML IJ SOLN
INTRAMUSCULAR | Status: AC
  Filled 2023-08-04: qty 6

## 2023-08-04 MED ORDER — FENTANYL CITRATE (PF) 100 MCG/2ML IJ SOLN
INTRAMUSCULAR | Status: AC | PRN
  Administered 2023-08-04: 50 ug via INTRAVENOUS

## 2023-08-04 MED ORDER — MIDAZOLAM HCL 2 MG/2ML IJ SOLN
INTRAMUSCULAR | Status: AC | PRN
  Administered 2023-08-04 (×3): 1 mg via INTRAVENOUS

## 2023-08-04 MED ORDER — OXYCODONE HCL 5 MG PO TABS
5.0000 mg | ORAL_TABLET | ORAL | Status: DC | PRN
  Administered 2023-08-04 – 2023-08-05 (×3): 10 mg via ORAL
  Filled 2023-08-04 (×3): qty 2

## 2023-08-04 NOTE — Plan of Care (Signed)

## 2023-08-04 NOTE — Consult Note (Addendum)
Chief Complaint: Patient was seen in consultation today for CT guided aspiration/possible drainage of abdominal wall fluid collection  Chief Complaint  Patient presents with   Fatigue   Dizziness   Post-op Problem    Referring Physician(s): Blackman,D  Supervising Physician: Roanna Banning  Patient Status: [facility] - In-pt  History of Present Illness: Jonathan Edwards is a 62 y.o. male with PMH sig for anxiety, prostate cancer 2021, depression, GERD, glaucoma, HTN who is s/p open retrorectus incisional hernia repair on 06/30/23 . His postop course was complicated by bleeding requiring return to OR and washout on 11/20 with drain placement. He ultimately recovered well and went home on 11/24. Pt was readmitted to [facility] on 12/22 with fatigue, dizziness, fevers and latest CT A/P revealing:   Abscess in the upper midline anterior abdominal wall. A surgical drain does not appear to communicate with the fluid collection.   Aortic Atherosclerosis  Latest temp 97.7, WBC 11.3, hgb 10.5, plts nl; creat 1.10, PT/INR nl; request now received from [facility] for asp/possible drainage of abd wall fluid collection.    Past Medical History:  Diagnosis Date   Anxiety    Cancer [facility])    prostate  2021   Cataract    Depression    GERD (gastroesophageal reflux disease)    Glaucoma    Hypertension     Past Surgical History:  Procedure Laterality Date   COLONOSCOPY  2011   EYE SURGERY     hole repair left eye and detached retina surgery   HERNIA REPAIR     umbilical with mesh   INCISIONAL HERNIA REPAIR N/A 06/30/2023   Procedure: open incisional hernia repair with mesh;  Surgeon: Moise Boring, MD;  Location: [facility];  Service: General;  Laterality: N/A;   LAPAROSCOPIC LYSIS OF ADHESIONS N/A 03/06/2020   Procedure: LAPAROSCOPIC LYSIS OF ADHESIONS;  Surgeon: Crista Elliot, MD;  Location: [facility];  Service: Urology;  Laterality: N/A;   LAPAROTOMY N/A 07/02/2023   Procedure: EXPLORATORY  LAPAROTOMY WITH HEMATOMA WASH OUT;  Surgeon: Moise Boring, MD;  Location: [facility];  Service: General;  Laterality: N/A;   PELVIC LYMPH NODE DISSECTION Bilateral 03/06/2020   Procedure: BILATERAL PELVIC LYMPH NODE DISSECTION;  Surgeon: Crista Elliot, MD;  Location: [facility];  Service: Urology;  Laterality: Bilateral;   ROBOT ASSISTED LAPAROSCOPIC RADICAL PROSTATECTOMY N/A 03/06/2020   Procedure: XI ROBOTIC ASSISTED LAPAROSCOPIC RADICAL PROSTATECTOMY;  Surgeon: Crista Elliot, MD;  Location: [facility];  Service: Urology;  Laterality: N/A;   TONSILLECTOMY      Allergies: Patient has no known allergies.  Medications: Prior to Admission medications   Medication Sig Start Date End Date Taking? Authorizing Provider  amLODipine (NORVASC) 5 MG tablet TAKE 1 TABLET (5 MG TOTAL) BY MOUTH DAILY. 07/14/23  Yes Mliss Sax, MD  buPROPion (WELLBUTRIN XL) 150 MG 24 hr tablet Take 1 tablet (150 mg total) by mouth in the morning and at bedtime. 05/05/23  Yes Mliss Sax, MD  Cholecalciferol (VITAMIN D) 50 MCG (2000 UT) tablet Take 2,000 Units by mouth daily.   Yes [provider]  Cyanocobalamin (B-12) 2500 MCG TABS Take 2,500 mcg by mouth daily.   Yes [provider]  cyclobenzaprine (FLEXERIL) 10 MG tablet Take 10 mg by mouth 3 (three) times daily as needed for muscle spasms.   Yes [provider]  Iron, Ferrous Sulfate, 325 (65 Fe) MG TABS Take 325 mg by mouth every other day.  03/26/23  Yes Mliss Sax, MD  lisinopril (ZESTRIL) 20 MG tablet TAKE 1 TABLET BY MOUTH EVERY DAY 07/14/23  Yes Mliss Sax, MD  Magnesium 250 MG CAPS Take 250 mg by mouth daily.   Yes [provider]  Multiple Vitamin (MULTIVITAMIN WITH MINERALS) TABS tablet Take 1 tablet by mouth daily.   Yes [provider]  Omega 3-6-9 Fatty Acids (TRIPLE OMEGA COMPLEX PO) Take 1 capsule by mouth daily.   Yes [provider]  zinc gluconate 50 MG  tablet Take 50 mg by mouth daily.   Yes [provider]     Family History  Problem Relation Age of Onset   Colon cancer Neg Hx    Colon polyps Neg Hx    Esophageal cancer Neg Hx    Rectal cancer Neg Hx    Stomach cancer Neg Hx     Social History   Socioeconomic History   Marital status: Married    Spouse name: Not on file   Number of children: Not on file   Years of education: Not on file   Highest education level: Not on file  Occupational History   Not on file  Tobacco Use   Smoking status: Former    Current packs/day: 0.00    Types: Cigarettes    Start date: 08/12/1968    Quit date: 08/12/1998    Years since quitting: 24.9   Smokeless tobacco: Never  Vaping Use   Vaping status: Never Used  Substance and Sexual Activity   Alcohol use: Yes    Comment: occasional   Drug use: No   Sexual activity: Yes    Birth control/protection: None  Other Topics Concern   Not on file  Social History Narrative   Not on file   Social Drivers of Health   Financial Resource Strain: Not on file  Food Insecurity: No Food Insecurity (08/03/2023)   Hunger Vital Sign    Worried About Running Out of Food in the Last Year: Never true    Ran Out of Food in the Last Year: Never true  Recent Concern: Food Insecurity - Food Insecurity Present (06/30/2023)   Hunger Vital Sign    Worried About Running Out of Food in the Last Year: Never true    Ran Out of Food in the Last Year: Sometimes true  Transportation Needs: No Transportation Needs (08/03/2023)   PRAPARE - Administrator, Civil Service (Medical): No    Lack of Transportation (Non-Medical): No  Physical Activity: Not on file  Stress: Not on file  Social Connections: Not on file    .  Review of Systems denies fever,HA,CP,dyspnea, cough, abd/back pain,N/V or bleeding  Vital Signs: BP (!) 120/97 (BP Location: Right Arm)   Pulse 85   Temp 97.7 F (36.5 C)   Resp 18   Ht 6\' 2"  (1.88 m)   Wt 237 lb (107.5  kg)   SpO2 99%   BMI 30.43 kg/m     Physical Exam: awake/alert; chest- CTA bilat; heart- RRR; abd-soft,+BS,NT; left surgical drain in place; midline vertical scar without drainage or erythema; no LE edema  Imaging: CT ABDOMEN PELVIS W CONTRAST Result Date: 08/03/2023 CLINICAL DATA:  Postoperative fever the patient underwent hernia repair in November 2024. EXAM: CT ABDOMEN AND PELVIS WITH CONTRAST TECHNIQUE: Multidetector CT imaging of the abdomen and pelvis was performed using the standard protocol following bolus administration of intravenous contrast. RADIATION DOSE REDUCTION: This exam was performed according to  the departmental dose-optimization program which includes automated exposure control, adjustment of the mA and/or kV according to patient size and/or use of iterative reconstruction technique. CONTRAST:  OMNIPAQUE IOHEXOL 300 MG/ML  SOLN COMPARISON:  CT abdomen pelvis dated 04/03/2023. FINDINGS: Lower chest: No acute abnormality. Hepatobiliary: Hepatic cysts are redemonstrated. No imaging follow-up is recommended for this finding. No gallstones, gallbladder wall thickening, or biliary dilatation. Pancreas: Unremarkable. No pancreatic ductal dilatation or surrounding inflammatory changes. Spleen: Normal in size without focal abnormality. Adrenals/Urinary Tract: Adrenal glands are unremarkable. Kidneys are normal, without renal calculi, focal lesion, or hydronephrosis. Bladder is incompletely distended. Stomach/Bowel: Stomach is within normal limits. Appendix appears normal. No evidence of bowel wall thickening, distention, or inflammatory changes. Vascular/Lymphatic: Aortic atherosclerosis. No enlarged abdominal or pelvic lymph nodes. Reproductive: Prostate is surgically absent. Other: A rim enhancing gas and fluid collection in the upper midline anterior abdominal wall measures 12.5 x 3.3 x 14.8 cm in size. This is most consistent with an abscess. A surgical drain enters the abdominal  wall inferior and to the left of this abscess, then crosses midline and extends superiorly along the right lateral aspect of the fluid collection. The surgical drain does not appear to communicate with the fluid collection. No abdominal wall hernia. Musculoskeletal: Degenerative changes are seen in the spine. IMPRESSION: Abscess in the upper midline anterior abdominal wall. A surgical drain does not appear to communicate with the fluid collection. Aortic Atherosclerosis (ICD10-I70.0). Electronically Signed   By: Romona Curls M.D.   On: 08/03/2023 20:05   DG Chest 2 View Result Date: 08/03/2023 CLINICAL DATA:  Fever and chills. EXAM: CHEST - 2 VIEW COMPARISON:  None Available. FINDINGS: The heart size and mediastinal contours are within normal limits. Both lungs are clear. The visualized skeletal structures are unremarkable. IMPRESSION: No active cardiopulmonary disease. Electronically Signed   By: Elgie Collard M.D.   On: 08/03/2023 16:49    Labs:  CBC: Recent Labs    07/05/23 0534 07/06/23 0449 08/03/23 1642 08/04/23 0344  WBC 12.9* 11.1* 14.0* 11.3*  HGB 9.6* 9.8* 11.9* 10.5*  HCT 30.5* 30.4* 39.3 34.8*  PLT 235 263 237 226    COAGS: Recent Labs    08/04/23 0819  INR 1.1    BMP: Recent Labs    07/05/23 0534 07/06/23 0449 08/03/23 1642 08/04/23 0344  NA 139 135 136 138  K 4.1 3.5 3.8 3.6  CL 108 104 105 108  CO2 25 25 24 23   GLUCOSE 89 101* 121* 123*  BUN 14 14 15 12   CALCIUM 8.3* 7.9* 8.3* 7.6*  CREATININE 0.96 1.00 1.27* 1.10  GFRNONAA >60 >60 >60 >60    LIVER FUNCTION TESTS: Recent Labs    03/25/23 1121 04/03/23 2228 08/03/23 1642  BILITOT 0.4 0.7 0.3  AST 28 33 18  ALT 26 33 19  ALKPHOS 61 58 55  PROT 6.8 7.5 6.7  ALBUMIN 4.3 4.2 3.3*    TUMOR MARKERS: No results for input(s): "AFPTM", "CEA", "CA199", "CHROMGRNA" in the last 8760 hours.  Assessment and Plan: 62 y.o. male with PMH sig for anxiety, prostate cancer 2021, depression, GERD,  glaucoma, HTN who is s/p open retrorectus incisional hernia repair on 06/30/23 . His postop course was complicated by bleeding requiring return to OR and washout on 11/20 with drain placement. He ultimately recovered well and went home on 11/24. Pt was readmitted to [facility] on 12/22 with fatigue, dizziness, fevers and latest CT A/P revealing:   Abscess in the upper  midline anterior abdominal wall. A surgical drain does not appear to communicate with the fluid collection.   Aortic Atherosclerosis  Latest temp 97.7, WBC 11.3, hgb 10.5, plts nl; creat 1.10, PT/INR nl; request now received from [facility] for asp/possible drainage of abd wall fluid collection.Imaging studies have been reviewed by Dr. Milford Cage.Risks and benefits discussed with the patient/family including bleeding, infection, damage to adjacent structures, bowel perforation/fistula connection, and sepsis.  All of the patient's questions were answered, patient is agreeable to proceed. Consent signed and in chart. Procedure scheduled for today.    Thank you for this interesting consult.  I greatly enjoyed meeting [facility] and look forward to participating in their care.  A copy of this report was sent to the requesting provider on this date.  Electronically Signed: D. Jeananne Rama, PA-C 08/04/2023, 10:40 AM   I spent a total of 25 minutes    in face to face in clinical consultation, greater than 50% of which was counseling/coordinating care for CT guided aspiration/possible drainage of abdominal wall fluid collection

## 2023-08-04 NOTE — Plan of Care (Signed)
  Problem: Education: Goal: Knowledge of General Education information will improve Description: Including pain rating scale, medication(s)/side effects and non-pharmacologic comfort measures Outcome: Progressing   Problem: Clinical Measurements: Goal: Ability to maintain clinical measurements within normal limits will improve Outcome: Progressing   Problem: Pain Management: Goal: General experience of comfort will improve Outcome: Progressing   Problem: Safety: Goal: Ability to remain free from injury will improve Outcome: Progressing

## 2023-08-04 NOTE — Progress Notes (Signed)
   08/04/23 1001  TOC Brief Assessment  Insurance and Status Reviewed  Patient has primary care physician Yes  Home environment has been reviewed home with spouse  Prior level of function: independent  Prior/Current Home Services No current home services  Social Drivers of Health Review SDOH reviewed no interventions necessary  Readmission risk has been reviewed Yes  Transition of care needs no transition of care needs at this time

## 2023-08-04 NOTE — Progress Notes (Addendum)
[facility] Progress Note     Subjective: CC:  Reports generalized fatigue and fevers at home. Denies nausea or vomiting. He is tolerating PO and having BMs.  Objective: Vital signs in last 24 hours: Temp:  [98.3 F (36.8 C)-99.3 F (37.4 C)] 98.3 F (36.8 C) (12/23 0522) Pulse Rate:  [89-103] 89 (12/23 0522) Resp:  [16-19] 18 (12/23 0522) BP: (117-147)/(77-102) 119/80 (12/23 0522) SpO2:  [96 %-100 %] 98 % (12/23 0522) Weight:  [107.5 kg] 107.5 kg (12/22 1615) Last BM Date : 08/03/23  Intake/Output from previous day: 12/22 0701 - 12/23 0700 In: 1510.5 [P.O.:510; I.V.:950.5; IV Piggyback:50] Out: 500 [Urine:500] Intake/Output this shift: No intake/output data recorded.  PE: Gen:  Alert, NAD, pleasant Abd: Soft, non-tender, mild fullness under incision without cellulitis, surgical drain in left abdomen with mininmal cloudy serous fluid Skin: warm and dry, no rashes  Psych: A&Ox3   Lab Results:  Recent Labs    08/03/23 1642 08/04/23 0344  WBC 14.0* 11.3*  HGB 11.9* 10.5*  HCT 39.3 34.8*  PLT 237 226   BMET Recent Labs    08/03/23 1642 08/04/23 0344  NA 136 138  K 3.8 3.6  CL 105 108  CO2 24 23  GLUCOSE 121* 123*  BUN 15 12  CREATININE 1.27* 1.10  CALCIUM 8.3* 7.6*   PT/INR No results for input(s): "LABPROT", "INR" in the last 72 hours. CMP     Component Value Date/Time   NA 138 08/04/2023 0344   K 3.6 08/04/2023 0344   CL 108 08/04/2023 0344   CO2 23 08/04/2023 0344   GLUCOSE 123 (H) 08/04/2023 0344   BUN 12 08/04/2023 0344   CREATININE 1.10 08/04/2023 0344   CALCIUM 7.6 (L) 08/04/2023 0344   PROT 6.7 08/03/2023 1642   ALBUMIN 3.3 (L) 08/03/2023 1642   AST 18 08/03/2023 1642   ALT 19 08/03/2023 1642   ALKPHOS 55 08/03/2023 1642   BILITOT 0.3 08/03/2023 1642   GFRNONAA >60 08/04/2023 0344   GFRAA >60 03/14/2020 0543   Lipase     Component Value Date/Time   LIPASE 31 04/03/2023 2228       Studies/Results: CT ABDOMEN  PELVIS W CONTRAST Result Date: 08/03/2023 CLINICAL DATA:  Postoperative fever the patient underwent hernia repair in November 2024. EXAM: CT ABDOMEN AND PELVIS WITH CONTRAST TECHNIQUE: Multidetector CT imaging of the abdomen and pelvis was performed using the standard protocol following bolus administration of intravenous contrast. RADIATION DOSE REDUCTION: This exam was performed according to the departmental dose-optimization program which includes automated exposure control, adjustment of the mA and/or kV according to patient size and/or use of iterative reconstruction technique. CONTRAST:  OMNIPAQUE IOHEXOL 300 MG/ML  SOLN COMPARISON:  CT abdomen pelvis dated 04/03/2023. FINDINGS: Lower chest: No acute abnormality. Hepatobiliary: Hepatic cysts are redemonstrated. No imaging follow-up is recommended for this finding. No gallstones, gallbladder wall thickening, or biliary dilatation. Pancreas: Unremarkable. No pancreatic ductal dilatation or surrounding inflammatory changes. Spleen: Normal in size without focal abnormality. Adrenals/Urinary Tract: Adrenal glands are unremarkable. Kidneys are normal, without renal calculi, focal lesion, or hydronephrosis. Bladder is incompletely distended. Stomach/Bowel: Stomach is within normal limits. Appendix appears normal. No evidence of bowel wall thickening, distention, or inflammatory changes. Vascular/Lymphatic: Aortic atherosclerosis. No enlarged abdominal or pelvic lymph nodes. Reproductive: Prostate is surgically absent. Other: A rim enhancing gas and fluid collection in the upper midline anterior abdominal wall measures 12.5 x 3.3 x 14.8 cm in size. This is most consistent with an  abscess. A surgical drain enters the abdominal wall inferior and to the left of this abscess, then crosses midline and extends superiorly along the right lateral aspect of the fluid collection. The surgical drain does not appear to communicate with the fluid collection. No abdominal  wall hernia. Musculoskeletal: Degenerative changes are seen in the spine. IMPRESSION: Abscess in the upper midline anterior abdominal wall. A surgical drain does not appear to communicate with the fluid collection. Aortic Atherosclerosis (ICD10-I70.0). Electronically Signed   By: Romona Curls M.D.   On: 08/03/2023 20:05   DG Chest 2 View Result Date: 08/03/2023 CLINICAL DATA:  Fever and chills. EXAM: CHEST - 2 VIEW COMPARISON:  None Available. FINDINGS: The heart size and mediastinal contours are within normal limits. Both lungs are clear. The visualized skeletal structures are unremarkable. IMPRESSION: No active cardiopulmonary disease. Electronically Signed   By: Elgie Collard M.D.   On: 08/03/2023 16:49    Anti-infectives: Anti-infectives (From admission, onward)    Start     Dose/Rate Route Frequency Ordered Stop   08/03/23 2200  piperacillin-tazobactam (ZOSYN) IVPB 3.375 g        3.375 g 12.5 mL/hr over 240 Minutes Intravenous Every 8 hours 08/03/23 2128 08/10/23 2159        Assessment/Plan  Abdominal wall abscess  S/p retrorectus incisional hernia repair, 90 min LOA 11/18 Dr. Hillery Hunter S/p ex lap, hematoma washout 11/20 Dr. Lucienne Minks - IR consult for perc drainage of abdominal wall abscess - can likely remove surgical drain as it is not sitting in in the abscess, will leave until I discuss with Dr. Audrie Lia  - continue IV abx   AKI - creatinine improved s/p IVF, 1.10 from 1.27 HTN - home meds held due to above AKI. Can resume tomorrow if needed. BP WNL this morning    LOS: 1 day   I reviewed nursing notes, last 24 h vitals and pain scores, last 48 h intake and output, last 24 h labs and trends, and last 24 h imaging results.  This care required straight-forward level of medical decision making.   Hosie Spangle, PA-C [facility] Please see Amion for pager number during day hours 7:00am-4:30pm

## 2023-08-04 NOTE — Procedures (Signed)
Vascular and Interventional Radiology Procedure Note  Patient: Jonathan Edwards DOB: 1960/12/01 Medical Record Number: [id number] Note Date/Time: 08/04/23 11:11 AM   Performing Physician: Roanna Banning, MD Assistant(s): None  Diagnosis: IHR, c/b anterior abdominal wall abscess  Procedure: DRAINAGE CATHETER PLACEMENT at ABDOMINAL WALL  Anesthesia: Conscious Sedation Complications: None Estimated Blood Loss: Minimal Specimens: Sent for Gram Stain, Aerobe Culture, and Anerobe Culture  Findings:  Successful CT-guided placement of 14 F catheter into SQ abdominal wall.  Plan:  - Flush drain with 5 mL Normal Saline every 8 hours. - Follow up drain evaluation / sinogram in 10 day(s).  See detailed procedure note with images in PACS. The patient tolerated the procedure well without incident or complication and was returned to Recovery in stable condition.    Roanna Banning, MD [facility] and Interventional Rad[facility] Radiology   Pager. [phone] Clinic. [phone]

## 2023-08-05 DIAGNOSIS — L02211 Cutaneous abscess of abdominal wall: Secondary | ICD-10-CM | POA: Diagnosis not present

## 2023-08-05 LAB — BASIC METABOLIC PANEL
Anion gap: 8 (ref 5–15)
BUN: 10 mg/dL (ref 8–23)
CO2: 22 mmol/L (ref 22–32)
Calcium: 8.1 mg/dL — ABNORMAL LOW (ref 8.9–10.3)
Chloride: 104 mmol/L (ref 98–111)
Creatinine, Ser: 1.04 mg/dL (ref 0.61–1.24)
GFR, Estimated: >60 mL/min (ref >60)
Glucose, Bld: 91 mg/dL (ref 70–99)
Potassium: 4 mmol/L (ref 3.5–5.1)
Sodium: 134 mmol/L — ABNORMAL LOW (ref 135–145)

## 2023-08-05 LAB — CBC
HCT: 36 % — ABNORMAL LOW (ref 39.0–52.0)
Hemoglobin: 11.2 g/dL — ABNORMAL LOW (ref 13.0–17.0)
MCH: 25.2 pg — ABNORMAL LOW (ref 26.0–34.0)
MCHC: 31.1 g/dL (ref 30.0–36.0)
MCV: 81.1 fL (ref 80.0–100.0)
Platelets: 275 10*3/uL (ref 150–400)
RBC: 4.44 MIL/uL (ref 4.22–5.81)
RDW: 15.3 % (ref 11.5–15.5)
WBC: 8 10*3/uL (ref 4.0–10.5)
nRBC: 0 % (ref 0.0–0.2)

## 2023-08-05 MED ORDER — BUPROPION HCL ER (XL) 150 MG PO TB24
150.0000 mg | ORAL_TABLET | Freq: Two times a day (BID) | ORAL | Status: DC
  Administered 2023-08-05: 150 mg via ORAL
  Filled 2023-08-05: qty 1

## 2023-08-05 MED ORDER — AMLODIPINE BESYLATE 5 MG PO TABS
5.0000 mg | ORAL_TABLET | Freq: Every day | ORAL | Status: DC
  Administered 2023-08-05: 5 mg via ORAL
  Filled 2023-08-05: qty 1

## 2023-08-05 MED ORDER — AMOXICILLIN-POT CLAVULANATE 875-125 MG PO TABS
1.0000 | ORAL_TABLET | Freq: Two times a day (BID) | ORAL | Status: DC
  Administered 2023-08-05 – 2023-08-06 (×3): 1 via ORAL
  Filled 2023-08-05 (×3): qty 1

## 2023-08-05 NOTE — Progress Notes (Signed)
[facility] Progress Note     Subjective: CC:  Feeling much better after abx and drain placement.  Has been AF and HDS. Denies current complaints.   Objective: Vital signs in last 24 hours: Temp:  [97.5 F (36.4 C)-98.4 F (36.9 C)] 98.4 F (36.9 C) (12/24 0601) Pulse Rate:  [74-88] 79 (12/24 0601) Resp:  [14-19] 17 (12/24 0601) BP: (108-130)/(72-97) 130/91 (12/24 0601) SpO2:  [94 %-100 %] 97 % (12/24 0601) Last BM Date : 08/03/23  Intake/Output from previous day: 12/23 0701 - 12/24 0700 In: 3758.5 [P.O.:1080; I.V.:2488.5; IV Piggyback:180] Out: 1875 [Urine:1500; Drains:375] Intake/Output this shift: No intake/output data recorded.  PE: Gen:  Alert, NAD, pleasant Abd: Soft, non-tender, dermabond intact, LLQ drain with minimal cloudy drainage, Right sided drain with cloudy drainage Skin: warm and dry, no rashes  Psych: A&Ox3   Lab Results:  Recent Labs    08/03/23 1642 08/04/23 0344  WBC 14.0* 11.3*  HGB 11.9* 10.5*  HCT 39.3 34.8*  PLT 237 226   BMET Recent Labs    08/03/23 1642 08/04/23 0344  NA 136 138  K 3.8 3.6  CL 105 108  CO2 24 23  GLUCOSE 121* 123*  BUN 15 12  CREATININE 1.27* 1.10  CALCIUM 8.3* 7.6*   PT/INR Recent Labs    08/04/23 0819  LABPROT 14.8  INR 1.1   CMP     Component Value Date/Time   NA 138 08/04/2023 0344   K 3.6 08/04/2023 0344   CL 108 08/04/2023 0344   CO2 23 08/04/2023 0344   GLUCOSE 123 (H) 08/04/2023 0344   BUN 12 08/04/2023 0344   CREATININE 1.10 08/04/2023 0344   CALCIUM 7.6 (L) 08/04/2023 0344   PROT 6.7 08/03/2023 1642   ALBUMIN 3.3 (L) 08/03/2023 1642   AST 18 08/03/2023 1642   ALT 19 08/03/2023 1642   ALKPHOS 55 08/03/2023 1642   BILITOT 0.3 08/03/2023 1642   GFRNONAA >60 08/04/2023 0344   GFRAA >60 03/14/2020 0543   Lipase     Component Value Date/Time   LIPASE 31 04/03/2023 2228       Studies/Results: CT GUIDED SOFT TISSUE FLUID DRAIN BY PERC CATH Result Date:  08/04/2023 INDICATION: [id number] Abdominal wall fluid collections [id number] EXAM: CT-GUIDED ANTERIOR ABDOMINAL WALL ABSCESS DRAINAGE CATHETER PLACEMENT COMPARISON:  CT AP, 08/03/2023. MEDICATIONS: The patient is currently admitted to the hospital and receiving intravenous antibiotics. The antibiotics were administered within an appropriate time frame prior to the initiation of the procedure. ANESTHESIA/SEDATION: Moderate (conscious) sedation was employed during this procedure. A total of Versed 3 mg and Fentanyl 50 mcg was administered intravenously. Moderate Sedation Time: 15 minutes. The patient's level of consciousness and vital signs were monitored continuously by radiology nursing throughout the procedure under my direct supervision. CONTRAST:  None COMPLICATIONS: None immediate. FLUOROSCOPY TIME:  CT dose; 934 mGycm PROCEDURE: RADIATION DOSE REDUCTION: This exam was performed according to the departmental dose-optimization program which includes automated exposure control, adjustment of the mA and/or kV according to patient size and/or use of iterative reconstruction technique. Informed written consent was obtained from the patient and/or patient's representative after a discussion of the risks, benefits and alternatives to treatment. The patient was placed supine on the CT gantry and a pre procedural CT was performed re-demonstrating the known abscess/fluid collection within the anterior abdominal wall. The procedure was planned. A timeout was performed prior to the initiation of the procedure. The RIGHT abdomen was prepped and draped in the usual sterile  fashion. The overlying soft tissues were anesthetized with 1% lidocaine with epinephrine. Appropriate trajectory was planned with the use of a 22 gauge spinal needle. An 18 gauge trocar needle was advanced into the abscess/fluid collection and a short Amplatz super stiff wire was coiled within the collection. Appropriate positioning was confirmed with a limited  CT scan. The tract was serially dilated allowing placement of a 14 Fr [place] all-purpose drainage catheter. Appropriate positioning was confirmed with a limited postprocedural CT scan. 20 ml of purulent fluid was aspirated. The tube was connected to a bulb suction and sutured in place. A dressing was placed. The patient tolerated the procedure well without immediate post procedural complication. IMPRESSION: Successful CT guided placement of a 14 Fr drainage catheter into the anterior abdominal wall abscess with aspiration of 20 mL of purulent fluid. Samples were sent to the laboratory as requested by the ordering clinical team. PLAN: The patient will return to Vascular Interventional Radiology (VIR) for routine drainage catheter evaluation in 10-14 days. Roanna Banning, MD [facility] Radiology Electronically Signed   By: Roanna Banning M.D.   On: 08/04/2023 17:11   CT ABDOMEN PELVIS W CONTRAST Result Date: 08/03/2023 CLINICAL DATA:  Postoperative fever the patient underwent hernia repair in November 2024. EXAM: CT ABDOMEN AND PELVIS WITH CONTRAST TECHNIQUE: Multidetector CT imaging of the abdomen and pelvis was performed using the standard protocol following bolus administration of intravenous contrast. RADIATION DOSE REDUCTION: This exam was performed according to the departmental dose-optimization program which includes automated exposure control, adjustment of the mA and/or kV according to patient size and/or use of iterative reconstruction technique. CONTRAST:  OMNIPAQUE IOHEXOL 300 MG/ML  SOLN COMPARISON:  CT abdomen pelvis dated 04/03/2023. FINDINGS: Lower chest: No acute abnormality. Hepatobiliary: Hepatic cysts are redemonstrated. No imaging follow-up is recommended for this finding. No gallstones, gallbladder wall thickening, or biliary dilatation. Pancreas: Unremarkable. No pancreatic ductal dilatation or surrounding inflammatory changes. Spleen:  Normal in size without focal abnormality. Adrenals/Urinary Tract: Adrenal glands are unremarkable. Kidneys are normal, without renal calculi, focal lesion, or hydronephrosis. Bladder is incompletely distended. Stomach/Bowel: Stomach is within normal limits. Appendix appears normal. No evidence of bowel wall thickening, distention, or inflammatory changes. Vascular/Lymphatic: Aortic atherosclerosis. No enlarged abdominal or pelvic lymph nodes. Reproductive: Prostate is surgically absent. Other: A rim enhancing gas and fluid collection in the upper midline anterior abdominal wall measures 12.5 x 3.3 x 14.8 cm in size. This is most consistent with an abscess. A surgical drain enters the abdominal wall inferior and to the left of this abscess, then crosses midline and extends superiorly along the right lateral aspect of the fluid collection. The surgical drain does not appear to communicate with the fluid collection. No abdominal wall hernia. Musculoskeletal: Degenerative changes are seen in the spine. IMPRESSION: Abscess in the upper midline anterior abdominal wall. A surgical drain does not appear to communicate with the fluid collection. Aortic Atherosclerosis (ICD10-I70.0). Electronically Signed   By: Romona Curls M.D.   On: 08/03/2023 20:05   DG Chest 2 View Result Date: 08/03/2023 CLINICAL DATA:  Fever and chills. EXAM: CHEST - 2 VIEW COMPARISON:  None Available. FINDINGS: The heart size and mediastinal contours are within normal limits. Both lungs are clear. The visualized skeletal structures are unremarkable. IMPRESSION: No active cardiopulmonary disease. Electronically Signed   By: Elgie Collard M.D.   On: 08/03/2023 16:49    Anti-infectives: Anti-infectives (From admission, onward)    Start  Dose/Rate Route Frequency Ordered Stop   08/03/23 2200  piperacillin-tazobactam (ZOSYN) IVPB 3.375 g        3.375 g 12.5 mL/hr over 240 Minutes Intravenous Every 8 hours 08/03/23 2128 08/10/23 2159         Assessment/Plan  Abdominal wall abscess s/p Dalphine Handing IHR  - s/p IR drain placement 12/23.  Will follow up culture data - Surgical drain removed today - Will transition to Augmentin and monitor clinical response.   AKI - creatinine improved s/p IVF, repeat BMP this morning  HTN - monitor, will resume home BP meds if needed   LOS: 2 days   I reviewed nursing notes, last 24 h vitals and pain scores, last 48 h intake and output, last 24 h labs and trends, and last 24 h imaging results.  This care required straight-forward level of medical decision making.   Melody Haver, MD [facility] Surgery Please see Amion for pager number during day hours 7:00am-4:30pm

## 2023-08-05 NOTE — Progress Notes (Signed)
Referring Physician(s): Blackman,D  Supervising Physician: Oley Balm  Patient Status:  [facility] - In-pt  Chief Complaint:  Abdominal pain/post op abd wall fluid collection  Subjective: Pt doing better since right abd drain placed yesterday; has some soreness at drain insertion site but denies fever,N/V; left abd surgical drain removed today by [facility]   Allergies: Patient has no known allergies.  Medications: Prior to Admission medications   Medication Sig Start Date End Date Taking? Authorizing Provider  amLODipine (NORVASC) 5 MG tablet TAKE 1 TABLET (5 MG TOTAL) BY MOUTH DAILY. 07/14/23  Yes Mliss Sax, MD  buPROPion (WELLBUTRIN XL) 150 MG 24 hr tablet Take 1 tablet (150 mg total) by mouth in the morning and at bedtime. 05/05/23  Yes Mliss Sax, MD  Cholecalciferol (VITAMIN D) 50 MCG (2000 UT) tablet Take 2,000 Units by mouth daily.   Yes [provider]  Cyanocobalamin (B-12) 2500 MCG TABS Take 2,500 mcg by mouth daily.   Yes [provider]  cyclobenzaprine (FLEXERIL) 10 MG tablet Take 10 mg by mouth 3 (three) times daily as needed for muscle spasms.   Yes [provider]  Iron, Ferrous Sulfate, 325 (65 Fe) MG TABS Take 325 mg by mouth every other day. 03/26/23  Yes Mliss Sax, MD  lisinopril (ZESTRIL) 20 MG tablet TAKE 1 TABLET BY MOUTH EVERY DAY 07/14/23  Yes Mliss Sax, MD  Magnesium 250 MG CAPS Take 250 mg by mouth daily.   Yes [provider]  Multiple Vitamin (MULTIVITAMIN WITH MINERALS) TABS tablet Take 1 tablet by mouth daily.   Yes [provider]  Omega 3-6-9 Fatty Acids (TRIPLE OMEGA COMPLEX PO) Take 1 capsule by mouth daily.   Yes [provider]  zinc gluconate 50 MG tablet Take 50 mg by mouth daily.   Yes [provider]     Vital Signs: BP (!) 130/91 (BP Location: Right Arm)   Pulse 79   Temp 98.4 F (36.9 C) (Oral)   Resp 17   Ht 6\' 2"  (1.88 m)    Wt 237 lb (107.5 kg)   SpO2 97%   BMI 30.43 kg/m   Physical Exam: awake/alert; rt abd drain intact, insertion site ok, mildly tender, OP 365 cc since yesterday turbid, blood-tinged fluid; drain flushed with minimal return  Imaging: CT GUIDED SOFT TISSUE FLUID DRAIN BY PERC CATH Result Date: 08/04/2023 INDICATION: [id number] Abdominal wall fluid collections [id number] EXAM: CT-GUIDED ANTERIOR ABDOMINAL WALL ABSCESS DRAINAGE CATHETER PLACEMENT COMPARISON:  CT AP, 08/03/2023. MEDICATIONS: The patient is currently admitted to the hospital and receiving intravenous antibiotics. The antibiotics were administered within an appropriate time frame prior to the initiation of the procedure. ANESTHESIA/SEDATION: Moderate (conscious) sedation was employed during this procedure. A total of Versed 3 mg and Fentanyl 50 mcg was administered intravenously. Moderate Sedation Time: 15 minutes. The patient's level of consciousness and vital signs were monitored continuously by radiology nursing throughout the procedure under my direct supervision. CONTRAST:  None COMPLICATIONS: None immediate. FLUOROSCOPY TIME:  CT dose; 934 mGycm PROCEDURE: RADIATION DOSE REDUCTION: This exam was performed according to the departmental dose-optimization program which includes automated exposure control, adjustment of the mA and/or kV according to patient size and/or use of iterative reconstruction technique. Informed written consent was obtained from the patient and/or patient's representative after a discussion of the risks, benefits and alternatives to treatment. The patient was placed supine on the CT gantry and a pre procedural CT was performed re-demonstrating the known  abscess/fluid collection within the anterior abdominal wall. The procedure was planned. A timeout was performed prior to the initiation of the procedure. The RIGHT abdomen was prepped and draped in the usual sterile fashion. The overlying soft tissues were anesthetized with 1%  lidocaine with epinephrine. Appropriate trajectory was planned with the use of a 22 gauge spinal needle. An 18 gauge trocar needle was advanced into the abscess/fluid collection and a short Amplatz super stiff wire was coiled within the collection. Appropriate positioning was confirmed with a limited CT scan. The tract was serially dilated allowing placement of a 14 Fr [place] all-purpose drainage catheter. Appropriate positioning was confirmed with a limited postprocedural CT scan. 20 ml of purulent fluid was aspirated. The tube was connected to a bulb suction and sutured in place. A dressing was placed. The patient tolerated the procedure well without immediate post procedural complication. IMPRESSION: Successful CT guided placement of a 14 Fr drainage catheter into the anterior abdominal wall abscess with aspiration of 20 mL of purulent fluid. Samples were sent to the laboratory as requested by the ordering clinical team. PLAN: The patient will return to Vascular Interventional Radiology (VIR) for routine drainage catheter evaluation in 10-14 days. Roanna Banning, MD [facility] Radiology Electronically Signed   By: Roanna Banning M.D.   On: 08/04/2023 17:11   CT ABDOMEN PELVIS W CONTRAST Result Date: 08/03/2023 CLINICAL DATA:  Postoperative fever the patient underwent hernia repair in November 2024. EXAM: CT ABDOMEN AND PELVIS WITH CONTRAST TECHNIQUE: Multidetector CT imaging of the abdomen and pelvis was performed using the standard protocol following bolus administration of intravenous contrast. RADIATION DOSE REDUCTION: This exam was performed according to the departmental dose-optimization program which includes automated exposure control, adjustment of the mA and/or kV according to patient size and/or use of iterative reconstruction technique. CONTRAST:  OMNIPAQUE IOHEXOL 300 MG/ML  SOLN COMPARISON:  CT abdomen pelvis dated 04/03/2023. FINDINGS: Lower  chest: No acute abnormality. Hepatobiliary: Hepatic cysts are redemonstrated. No imaging follow-up is recommended for this finding. No gallstones, gallbladder wall thickening, or biliary dilatation. Pancreas: Unremarkable. No pancreatic ductal dilatation or surrounding inflammatory changes. Spleen: Normal in size without focal abnormality. Adrenals/Urinary Tract: Adrenal glands are unremarkable. Kidneys are normal, without renal calculi, focal lesion, or hydronephrosis. Bladder is incompletely distended. Stomach/Bowel: Stomach is within normal limits. Appendix appears normal. No evidence of bowel wall thickening, distention, or inflammatory changes. Vascular/Lymphatic: Aortic atherosclerosis. No enlarged abdominal or pelvic lymph nodes. Reproductive: Prostate is surgically absent. Other: A rim enhancing gas and fluid collection in the upper midline anterior abdominal wall measures 12.5 x 3.3 x 14.8 cm in size. This is most consistent with an abscess. A surgical drain enters the abdominal wall inferior and to the left of this abscess, then crosses midline and extends superiorly along the right lateral aspect of the fluid collection. The surgical drain does not appear to communicate with the fluid collection. No abdominal wall hernia. Musculoskeletal: Degenerative changes are seen in the spine. IMPRESSION: Abscess in the upper midline anterior abdominal wall. A surgical drain does not appear to communicate with the fluid collection. Aortic Atherosclerosis (ICD10-I70.0). Electronically Signed   By: Romona Curls M.D.   On: 08/03/2023 20:05   DG Chest 2 View Result Date: 08/03/2023 CLINICAL DATA:  Fever and chills. EXAM: CHEST - 2 VIEW COMPARISON:  None Available. FINDINGS: The heart size and mediastinal contours are within normal limits. Both lungs are clear. The visualized skeletal structures are unremarkable. IMPRESSION: No  active cardiopulmonary disease. Electronically Signed   By: Elgie Collard M.D.   On:  08/03/2023 16:49    Labs:  CBC: Recent Labs    07/06/23 0449 08/03/23 1642 08/04/23 0344 08/05/23 0840  WBC 11.1* 14.0* 11.3* 8.0  HGB 9.8* 11.9* 10.5* 11.2*  HCT 30.4* 39.3 34.8* 36.0*  PLT 263 237 226 275    COAGS: Recent Labs    08/04/23 0819  INR 1.1    BMP: Recent Labs    07/05/23 0534 07/06/23 0449 08/03/23 1642 08/04/23 0344  NA 139 135 136 138  K 4.1 3.5 3.8 3.6  CL 108 104 105 108  CO2 25 25 24 23   GLUCOSE 89 101* 121* 123*  BUN 14 14 15 12   CALCIUM 8.3* 7.9* 8.3* 7.6*  CREATININE 0.96 1.00 1.27* 1.10  GFRNONAA >60 >60 >60 >60    LIVER FUNCTION TESTS: Recent Labs    03/25/23 1121 04/03/23 2228 08/03/23 1642  BILITOT 0.4 0.7 0.3  AST 28 33 18  ALT 26 33 19  ALKPHOS 61 58 55  PROT 6.8 7.5 6.7  ALBUMIN 4.3 4.2 3.3*    Assessment and Plan: 62 y.o. male with PMH sig for anxiety, prostate cancer 2021, depression, GERD, glaucoma, HTN who is s/p open retrorectus incisional hernia repair on 06/30/23 . His postop course was complicated by bleeding requiring return to OR and washout on 11/20 with drain placement. He ultimately recovered well and went home on 11/24. Pt was readmitted to [facility] on 12/22 with fatigue, dizziness, fevers and latest CT A/P revealing ant abd wall fluid collection, s/p drain placement 12/23 (14 fr to JP); afebrile, WBC nl, hgb 11.2, drain fl cx pend; cont drain flushes, close output monitoring, lab checks; once output minimal over a 2-3 day span or if WBC increases obtain f/u CT   Electronically Signed: D. Jeananne Rama, PA-C 08/05/2023, 10:06 AM   I spent a total of 15 minutes at the the patient's bedside AND on the patient's hospital floor or unit, greater than 50% of which was counseling/coordinating care for abdominal wall fluid collection drain    Patient ID: Jonathan Edwards, male   DOB: 15-Mar-1961, 62 y.o.   MRN: [id number]

## 2023-08-06 ENCOUNTER — Other Ambulatory Visit: Payer: Self-pay | Admitting: General Surgery

## 2023-08-06 DIAGNOSIS — E875 Hyperkalemia: Secondary | ICD-10-CM

## 2023-08-06 LAB — BASIC METABOLIC PANEL
Anion gap: 8 (ref 5–15)
BUN: 10 mg/dL (ref 8–23)
CO2: 25 mmol/L (ref 22–32)
Calcium: 8.7 mg/dL — ABNORMAL LOW (ref 8.9–10.3)
Chloride: 103 mmol/L (ref 98–111)
Creatinine, Ser: 0.97 mg/dL (ref 0.61–1.24)
GFR, Estimated: >60 mL/min (ref >60)
Glucose, Bld: 102 mg/dL — ABNORMAL HIGH (ref 70–99)
Potassium: 3.9 mmol/L (ref 3.5–5.1)
Sodium: 136 mmol/L (ref 135–145)

## 2023-08-06 LAB — CBC
HCT: 37.4 % — ABNORMAL LOW (ref 39.0–52.0)
Hemoglobin: 11.4 g/dL — ABNORMAL LOW (ref 13.0–17.0)
MCH: 24.5 pg — ABNORMAL LOW (ref 26.0–34.0)
MCHC: 30.5 g/dL (ref 30.0–36.0)
MCV: 80.4 fL (ref 80.0–100.0)
Platelets: 287 10*3/uL (ref 150–400)
RBC: 4.65 MIL/uL (ref 4.22–5.81)
RDW: 15 % (ref 11.5–15.5)
WBC: 8.8 10*3/uL (ref 4.0–10.5)
nRBC: 0 % (ref 0.0–0.2)

## 2023-08-06 MED ORDER — SULFAMETHOXAZOLE-TRIMETHOPRIM 800-160 MG PO TABS: 1.0000 | ORAL_TABLET | Freq: Two times a day (BID) | ORAL | 0 refills | Status: AC

## 2023-08-06 NOTE — Progress Notes (Signed)
[facility] Progress Note     Subjective: CC:  Continues to do well.  AF and HDS. WBC stable on PO abx. Fluid + Citrobacter  Objective: Vital signs in last 24 hours: Temp:  [97.9 F (36.6 C)-98.8 F (37.1 C)] 98.1 F (36.7 C) (12/25 0535) Pulse Rate:  [81-87] 81 (12/25 0535) Resp:  [15-18] 15 (12/25 0535) BP: (143-160)/(92-103) 143/99 (12/25 0535) SpO2:  [97 %-99 %] 98 % (12/25 0535) Last BM Date : 08/05/23  Intake/Output from previous day: 12/24 0701 - 12/25 0700 In: 1321.4 [P.O.:750; I.V.:517.5; IV Piggyback:48.9] Out: 60 [Drains:60] Intake/Output this shift: Total I/O In: 280 [P.O.:270; I.V.:10] Out: 30 [Drains:30]  PE: Gen:  Alert, NAD, pleasant Abd: Soft, non-tender, dermabond intact, Right sided drain with cloudy drainage Skin: warm and dry, no rashes  Psych: A&Ox3   Lab Results:  Recent Labs    08/05/23 0840 08/06/23 0318  WBC 8.0 8.8  HGB 11.2* 11.4*  HCT 36.0* 37.4*  PLT 275 287   BMET Recent Labs    08/05/23 0840 08/06/23 0318  NA 134* 136  K 4.0 3.9  CL 104 103  CO2 22 25  GLUCOSE 91 102*  BUN 10 10  CREATININE 1.04 0.97  CALCIUM 8.1* 8.7*   PT/INR Recent Labs    08/04/23 0819  LABPROT 14.8  INR 1.1   CMP     Component Value Date/Time   NA 136 08/06/2023 0318   K 3.9 08/06/2023 0318   CL 103 08/06/2023 0318   CO2 25 08/06/2023 0318   GLUCOSE 102 (H) 08/06/2023 0318   BUN 10 08/06/2023 0318   CREATININE 0.97 08/06/2023 0318   CALCIUM 8.7 (L) 08/06/2023 0318   PROT 6.7 08/03/2023 1642   ALBUMIN 3.3 (L) 08/03/2023 1642   AST 18 08/03/2023 1642   ALT 19 08/03/2023 1642   ALKPHOS 55 08/03/2023 1642   BILITOT 0.3 08/03/2023 1642   GFRNONAA >60 08/06/2023 0318   GFRAA >60 03/14/2020 0543   Lipase     Component Value Date/Time   LIPASE 31 04/03/2023 2228       Studies/Results: CT GUIDED SOFT TISSUE FLUID DRAIN BY PERC CATH Result Date: 08/04/2023 INDICATION: [id number] Abdominal wall fluid collections [id number]  EXAM: CT-GUIDED ANTERIOR ABDOMINAL WALL ABSCESS DRAINAGE CATHETER PLACEMENT COMPARISON:  CT AP, 08/03/2023. MEDICATIONS: The patient is currently admitted to the hospital and receiving intravenous antibiotics. The antibiotics were administered within an appropriate time frame prior to the initiation of the procedure. ANESTHESIA/SEDATION: Moderate (conscious) sedation was employed during this procedure. A total of Versed 3 mg and Fentanyl 50 mcg was administered intravenously. Moderate Sedation Time: 15 minutes. The patient's level of consciousness and vital signs were monitored continuously by radiology nursing throughout the procedure under my direct supervision. CONTRAST:  None COMPLICATIONS: None immediate. FLUOROSCOPY TIME:  CT dose; 934 mGycm PROCEDURE: RADIATION DOSE REDUCTION: This exam was performed according to the departmental dose-optimization program which includes automated exposure control, adjustment of the mA and/or kV according to patient size and/or use of iterative reconstruction technique. Informed written consent was obtained from the patient and/or patient's representative after a discussion of the risks, benefits and alternatives to treatment. The patient was placed supine on the CT gantry and a pre procedural CT was performed re-demonstrating the known abscess/fluid collection within the anterior abdominal wall. The procedure was planned. A timeout was performed prior to the initiation of the procedure. The RIGHT abdomen was prepped and draped in the usual sterile fashion. The overlying soft  tissues were anesthetized with 1% lidocaine with epinephrine. Appropriate trajectory was planned with the use of a 22 gauge spinal needle. An 18 gauge trocar needle was advanced into the abscess/fluid collection and a short Amplatz super stiff wire was coiled within the collection. Appropriate positioning was confirmed with a limited CT scan. The tract was serially dilated allowing placement of a 14 Fr  [place] all-purpose drainage catheter. Appropriate positioning was confirmed with a limited postprocedural CT scan. 20 ml of purulent fluid was aspirated. The tube was connected to a bulb suction and sutured in place. A dressing was placed. The patient tolerated the procedure well without immediate post procedural complication. IMPRESSION: Successful CT guided placement of a 14 Fr drainage catheter into the anterior abdominal wall abscess with aspiration of 20 mL of purulent fluid. Samples were sent to the laboratory as requested by the ordering clinical team. PLAN: The patient will return to Vascular Interventional Radiology (VIR) for routine drainage catheter evaluation in 10-14 days. Roanna Banning, MD [facility] Radiology Electronically Signed   By: Roanna Banning M.D.   On: 08/04/2023 17:11    Anti-infectives: Anti-infectives (From admission, onward)    Start     Dose/Rate Route Frequency Ordered Stop   08/05/23 1000  amoxicillin-clavulanate (AUGMENTIN) 875-125 MG per tablet 1 tablet        1 tablet Oral Every 12 hours 08/05/23 0834     08/03/23 2200  piperacillin-tazobactam (ZOSYN) IVPB 3.375 g  Status:  Discontinued        3.375 g 12.5 mL/hr over 240 Minutes Intravenous Every 8 hours 08/03/23 2128 08/05/23 0834        Assessment/Plan  Abdominal wall abscess s/p Dalphine Handing IHR  - s/p IR drain placement 12/23.  Fluid + Citrobacter. Will follow up culture data - Will transition to Bactrim - Discharge this morning  AKI - creatinine improved s/p IVF, repeat BMP this morning stable HTN - home amlodipine    LOS: 3 days   I reviewed nursing notes, last 24 h vitals and pain scores, last 48 h intake and output, last 24 h labs and trends, and last 24 h imaging results.  This care required straight-forward level of medical decision making.   Melody Haver, MD [facility] Surgery Please see Amion for pager number during day hours  7:00am-4:30pm

## 2023-08-06 NOTE — Discharge Summary (Signed)
Physician Discharge Summary  Patient ID: Jonathan Edwards MRN: [id number] DOB/AGE: 1961/02/18 62 y.o.  Admit date: 08/03/2023 Discharge date: 08/06/2023  Admission Diagnoses:  Discharge Diagnoses:  Principal Problem:   Abdominal wall abscess   Discharged Condition: stable  Hospital Course: 62 y/o M w/ a hx of prostate cancer s/p prostatectomy c/b incisional hernia s/p open repair in November who presented with fevers and malaise related to an abdominal wall abscess.  He was started on antibiotics and underwent drain placement by IR.  His WBC and Cr were elevated but trended down to normal following treatment.  His fluid was + citrobacter and at the time of discharge susceptibility information was not available.  Following a discussion with pharmacy, the decision was made to discharge him on a course of bactrim.  He discharged with the IR drain in place but a prior surgical drain was removed. I will plan to follow up his final culture data and adjust the plan as necessary  Discharge Exam: Blood pressure (!) 143/99, pulse 81, temperature 98.1 F (36.7 C), temperature source Oral, resp. rate 15, height 6\' 2"  (1.88 m), weight 107.5 kg, SpO2 98%.  Disposition: Discharge disposition: 01-Home or Self Care        Allergies as of 08/06/2023   No Known Allergies      Medication List     TAKE these medications    amLODipine 5 MG tablet Commonly known as: NORVASC TAKE 1 TABLET (5 MG TOTAL) BY MOUTH DAILY.   B-12 2500 MCG Tabs Take 2,500 mcg by mouth daily.   buPROPion 150 MG 24 hr tablet Commonly known as: Wellbutrin XL Take 1 tablet (150 mg total) by mouth in the morning and at bedtime.   cyclobenzaprine 10 MG tablet Commonly known as: FLEXERIL Take 10 mg by mouth 3 (three) times daily as needed for muscle spasms.   Iron (Ferrous Sulfate) 325 (65 Fe) MG Tabs Take 325 mg by mouth every other day.   lisinopril 20 MG tablet Commonly known as: ZESTRIL TAKE 1 TABLET BY MOUTH  EVERY DAY   Magnesium 250 MG Caps Take 250 mg by mouth daily.   multivitamin with minerals Tabs tablet Take 1 tablet by mouth daily.   sulfamethoxazole-trimethoprim 800-160 MG tablet Commonly known as: Bactrim DS Take 1 tablet by mouth 2 (two) times daily for 14 days.   TRIPLE OMEGA COMPLEX PO Take 1 capsule by mouth daily.   Vitamin D 50 MCG (2000 UT) tablet Take 2,000 Units by mouth daily.   zinc gluconate 50 MG tablet Take 50 mg by mouth daily.         Signed: Moise Boring 08/06/2023, 1:34 PM

## 2023-08-06 NOTE — Discharge Instructions (Signed)
Home Care After Complex Abdominal Wall Hernia   Activity  Limit activity for the first 24 hours, then you may return to normal daily activities. Returning to normal daily activities as soon as you can following surgery will enhance recovery time.  No heavy lifting pushing or pulling, anything heavier than 10 pounds (gallon of milk weighs approx. 8.8 pounds) for 12 weeks from surgery date.  Wear abdominal binder for 12 weeks.  Do not mow the lawn, use a vacuum cleaner, or do any other strenuous activities without first consulting your surgical team.  Climb stairs slowly and watch your step.  Walk as often as you feel able to increase strength and endurance.  No driving or operating heavy machinery within 24 hours of taking narcotic pain medication.  Abdominal Core Rehab if referral placed for you. If you think you could benefit from rehab please talk with your surgeon.   For additional information about your recovery and activities you can do to help you recover, please download the [place] [facility]) app to your phone and follow the instructions.  The app can be found by clicking one of the links below, by entering [facility]' in your Appstore, or by opening your camera app and following one of the links in the QR codes below:  Diet  Drink plenty of fluids and eat a light meal on the night of surgery. Some patients may find their appetite is poor for a week or two after surgery. This is a normal result of the stress of surgery-your appetite will return in time.   There are no specific diet restrictions after surgery.  Drinking plenty of fluids and high fiber diet will assist with the prevention of constipation.   Dressings and Wound Care  Keep your wound or incision site clean and dry.  You may have different types of dressings covering your incisions depending on your operation and your surgeon: o Dermabond/Durabond (skin glue): This will usually remain in place  for 10-14 days, then naturally fall off your skin. You may take a shower 24 hrs after surgery, carefully wash, not scrub the incision site with a mild non-scented soap. Pat dry with a soft towel.  Do not pick or peel skin glue off. o Combined outer dressing and inner steri-strips: The outer dressing is typically a plastic film that has a small bandage attached to it. This outer dressing can be removed two days after your operation. For example, if your operation was on Monday, the outer dressing can be removed Wednesday. This should be done for each site you have an incision. Once removed, you will expose the inner steri-strip which looks like a paper strip. Leave these on for about two weeks until they start to peel and then you can take them off.  o Steri-strips alone: Leave these on for about two weeks until they start to peel and then you can take them off.  o Combined outer dressing and skin staples: The outer dressing is typically a thick padding secured in place with tape.  This combined outer dressing and skin staples will need to stay dry for two days after your operation, then you may remove the outer dressing to shower. For example, if your operation was on Monday, the outer dressing can be removed Wednesday.  Gently wash, do not scrub, the incision with a mild non-scented soap.  Pat dry with a soft towel. Cover incision and skin staples with clean dressings and tape in place. The  staples will be removed in the office at your postoperative.   You can shower and let the water fall on the dressings above. Do not soak or submerge your incision(s) in a bath tub, hot tub, or swimming pool, until your doctor says it is ok to do so or the incision(s) have completely healed, usually about 2-4 weeks.  Do not use creams, powder, salves or balms on your incision(s).  Drain Care  If you are discharged with a drain you will given instructions on changing the dressing around the drain, how to empty the drain,  how to clean the skin around the drain tube, and how to prevent clogs in the drain from your nurse.   Remember to empty the drain when it is half full, and measure and record the output every time you empty the drain.  Please bring your daily drain output log with you to your appointments for the surgical team to review.  This log helps determine if the drain is ready to be removed by the surgical team.   Before emptying the drain please remember to look for any clots and clear them from the drain tube by "stripping" or "milking" the tubing. Follow the steps for stripping or milking the tube: o Use one hand to hold and pinch the tube close to where it leaves the skin. o With the thumb and first finger of your other hand, pinch the tube just below where you are holding it.  o Slowly and firmly push your thumb and first finger down the tubing toward the end of the tube. o Repeat as many times as needed to move the clot.   It is important that the drain bulb stays decompressed or flat as this will create the suction that pulls the fluid into the bulb.  stripping the drain tube to prevent the tube from clogging.  Call your surgeon's office if: o The drainage color has suddenly changed from a light, clear red or pink to bloody or bright red in color. o The drainage smell has changed. o The drainage has pus (thick yellowish fluid). o There is a lot of fluid around the drain. o Signs of infection, such as sudden increased pain, swelling, warmth, or redness around the drain tube.   What to Expect After Surgery   Moderate discomfort controlled with medications  Minimal drainage from incision  You may feel pain in one or both shoulders. This pain comes from the gas still left in your belly after the surgery, if you had laparoscopic surgery (several small incisions). The pain should ease over several days to a week. Ambulation will help with this pain.   Belly swelling  Feeling fatigue and  weak  Constipation after surgery is common. Drink plenty fluids and eat a high fiber diet.  Swelling - In some patients might feel that their hernia has returned after surgery-DO NOT Worry this is normal. Swelling may be due to the development of a seroma. Seroma is fluid that has built up where the hernia was repaired this is a normal result after surgery and it will slowly reabsorb back into your body over the next several weeks.   Pain Control: Over the Counter Medications to take as needed  Acetaminophen/Ibuprofen: Once you are finished with prescribed narcotic and non-narcotic pain medications, your pain level should be low enough to start using over the counter pain relief medications (ibuprofen or acetaminophen) as needed.  Colace/Docusate: May be prescribed by your surgeon to prevent  constipation caused by the combination of narcotics, effects of anesthesia, and decreased ambulation.  Hold for loose stools or diarrhea. Take 100 mg 1-2 times a day starting tonight.   Fiber: High fiber foods, extra liquids (water 9-13 cups/day) can also assist with constipation. Examples of high fiber foods are fruit, bran. Prune juice and water are also good liquids to drink.  Milk of Magnesia/Miralax:  If constipated despite take the over the counter stool softeners you may take Milk of Magnesia or Miralax as directed on bottle to assist with constipation.     Pepcid/Famotidine: May be prescribed while taking naproxen (Aleve) or other NSAIDs such as ibuprofen (Motrin/Advil) to prevent stomach upset or Acid-reflux symptoms. Take 1 tablet 1-2 times a day.   **Home medications: You may restart your home medications as directed by your respective Primary Care Physician or Surgeon.   When to notify your Doctor or Healthcare Team  Sign of Wound Infection   Fever over 100 degrees.  Wound becomes extremely swollen, shows red streaks, warm to the touch, and/or drainage from the incision site or foul-smelling  drainage.  Wound edges separate or opens up  Bleeding or bruising   If you have bleeding, apply pressure to the site and hold the pressure firmly for 5 minutes. If the bleeding continues, apply pressure again and call 911. If the bleeding stopped, call your doctor to report it.   Call your doctor or nurse if you have increased bleeding from your site and increased bruising or a lump forms or gets larger under your skin at the site.  Unrelieved Pain    Call your doctor or nurse if your pain gets worse or is not eased 1 hour after taking your pain medicine, or if it is severe and uncontrolled.  Nausea and Vomiting    Call your doctor or nurse if you have nausea and vomiting that continues more than 24 hours, will not let you keep medicine down and will not let you keep fluids down Fever, Flu-like symptoms   Fever over 100 degrees and/or chills  Gastrointestinal Bleeding Symptoms    Black tarry bowel movements.  This can be normal after surgery on the stomach, but should resolve in a day or two.    Call 911 if you suddenly have signs of blood loss such as:  Vomiting blood  Fast heart rate  Feeling faint, sweaty, or blacking out  Passing bright red blood from your rectum  Blood Clot Symptoms  Tender, swollen or reddened areas in your calf muscle or thighs.  Numbness or tingling in your lower leg or calf, or at the top of your leg or groin  Skin on your leg looks pale or blue or feels cold to touch  Chest pain or have trouble breathing, lightheadedness, fast heart rate  Sudden Onset of Symptoms    Call 911 if you suddenly have:  Leg weakness and spasm  Loss of bladder or bowel function  Seizure  Confusion, severe headache, dizziness or feeling unsteady, problems talking, difficulty swallowing, and/or numbness or muscle weakness as these could be signs of a stroke.  Follow up Appointment Your follow up appointment should be scheduled 2-3 weeks after your surgery date.  If you have not  previously scheduled for a follow-up visit you can be scheduled by contacting [phone].

## 2023-08-09 LAB — AEROBIC/ANAEROBIC CULTURE W GRAM STAIN (SURGICAL/DEEP WOUND)

## 2023-08-19 ENCOUNTER — Other Ambulatory Visit: Payer: Self-pay | Admitting: General Surgery

## 2023-08-19 ENCOUNTER — Ambulatory Visit: Payer: Federal, State, Local not specified - PPO | Admitting: Family Medicine

## 2023-08-19 DIAGNOSIS — K432 Incisional hernia without obstruction or gangrene: Secondary | ICD-10-CM

## 2023-08-19 DIAGNOSIS — L02211 Cutaneous abscess of abdominal wall: Secondary | ICD-10-CM

## 2023-08-24 NOTE — Progress Notes (Signed)
 Referring Physician(s): Metzger,Gregory A  Chief Complaint: The patient is seen in follow up today s/p abdominal wall abscess drain placed 08/04/23  History of present illness: Jonathan Edwards is a 63 y.o. male with PMH significant for anxiety, prostate cancer (2021), depression, GERD, glaucoma and HTN who is s/p open retrorectus incisional hernia repair on 06/30/23. His post-op course was complicated by bleeding requiring return to OR and washout on 11/20 with drain placement. He ultimately recovered well and went home on 11/24. He was readmitted to [facility] on 12/22 with fatigue, dizziness, fevers and CT showing an abscess in the upper midline anterior abdominal wall. Interventional Radiology was consulted and the patient received a 14 Fr drain 08/04/23. Fluid cultures showed citrobacter and he was discharged home 08/06/23 with oral bactrim .  He presents today for a drain evaluation with CT imaging and possible drain injection under fluoroscopy. He has had minimal output from the drain.  Flushes now reflux along the skin entry site.  No fevers or chills.  No new abdominal pain.    Past Medical History:  Diagnosis Date   Anxiety    Cancer [facility])    prostate  2021   Cataract    Depression    GERD (gastroesophageal reflux disease)    Glaucoma    Hypertension     Past Surgical History:  Procedure Laterality Date   COLONOSCOPY  2011   EYE SURGERY     hole repair left eye and detached retina surgery   HERNIA REPAIR     umbilical with mesh   INCISIONAL HERNIA REPAIR N/A 06/30/2023   Procedure: open incisional hernia repair with mesh;  Surgeon: Polly Cordella LABOR, MD;  Location: [facility] ORS;  Service: General;  Laterality: N/A;   LAPAROSCOPIC LYSIS OF ADHESIONS N/A 03/06/2020   Procedure: LAPAROSCOPIC LYSIS OF ADHESIONS;  Surgeon: Carolee Sherwood JONETTA DOUGLAS, MD;  Location: [facility];  Service: Urology;  Laterality: N/A;   LAPAROTOMY N/A 07/02/2023   Procedure: EXPLORATORY LAPAROTOMY WITH HEMATOMA WASH OUT;   Surgeon: Polly Cordella LABOR, MD;  Location: [facility];  Service: General;  Laterality: N/A;   PELVIC LYMPH NODE DISSECTION Bilateral 03/06/2020   Procedure: BILATERAL PELVIC LYMPH NODE DISSECTION;  Surgeon: Carolee Sherwood JONETTA DOUGLAS, MD;  Location: [facility];  Service: Urology;  Laterality: Bilateral;   ROBOT ASSISTED LAPAROSCOPIC RADICAL PROSTATECTOMY N/A 03/06/2020   Procedure: XI ROBOTIC ASSISTED LAPAROSCOPIC RADICAL PROSTATECTOMY;  Surgeon: Carolee Sherwood JONETTA DOUGLAS, MD;  Location: [facility];  Service: Urology;  Laterality: N/A;   TONSILLECTOMY      Allergies: Patient has no known allergies.  Medications: Prior to Admission medications   Medication Sig Start Date End Date Taking? Authorizing Provider  amLODipine  (NORVASC ) 5 MG tablet TAKE 1 TABLET (5 MG TOTAL) BY MOUTH DAILY. 07/14/23   Berneta Elsie Sayre, MD  buPROPion  (WELLBUTRIN  XL) 150 MG 24 hr tablet Take 1 tablet (150 mg total) by mouth in the morning and at bedtime. 05/05/23   Berneta Elsie Sayre, MD  Cholecalciferol (VITAMIN D ) 50 MCG (2000 UT) tablet Take 2,000 Units by mouth daily.    [provider]  Cyanocobalamin (B-12) 2500 MCG TABS Take 2,500 mcg by mouth daily.    [provider]  cyclobenzaprine  (FLEXERIL ) 10 MG tablet Take 10 mg by mouth 3 (three) times daily as needed for muscle spasms.    [provider]  Iron , Ferrous Sulfate , 325 (65 Fe) MG TABS Take 325 mg by mouth every other day. 03/26/23   Berneta Elsie Sayre,  MD  lisinopril  (ZESTRIL ) 20 MG tablet TAKE 1 TABLET BY MOUTH EVERY DAY 07/14/23   Berneta Elsie Sayre, MD  Magnesium 250 MG CAPS Take 250 mg by mouth daily.    [provider]  Multiple Vitamin (MULTIVITAMIN WITH MINERALS) TABS tablet Take 1 tablet by mouth daily.    [provider]  Omega 3-6-9 Fatty Acids (TRIPLE OMEGA COMPLEX PO) Take 1 capsule by mouth daily.    [provider]  zinc gluconate 50 MG tablet Take 50 mg by mouth daily.    [provider]      Family History  Problem Relation Age of Onset   Colon cancer Neg Hx    Colon polyps Neg Hx    Esophageal cancer Neg Hx    Rectal cancer Neg Hx    Stomach cancer Neg Hx     Social History   Socioeconomic History   Marital status: Married    Spouse name: Not on file   Number of children: Not on file   Years of education: Not on file   Highest education level: Not on file  Occupational History   Not on file  Tobacco Use   Smoking status: Former    Current packs/day: 0.00    Types: Cigarettes    Start date: 08/12/1968    Quit date: 08/12/1998    Years since quitting: 25.0   Smokeless tobacco: Never  Vaping Use   Vaping status: Never Used  Substance and Sexual Activity   Alcohol use: Yes    Comment: occasional   Drug use: No   Sexual activity: Yes    Birth control/protection: None  Other Topics Concern   Not on file  Social History Narrative   Not on file   Social Drivers of Health   Financial Resource Strain: Not on file  Food Insecurity: No Food Insecurity (08/03/2023)   Hunger Vital Sign    Worried About Running Out of Food in the Last Year: Never true    Ran Out of Food in the Last Year: Never true  Recent Concern: Food Insecurity - Food Insecurity Present (06/30/2023)   Hunger Vital Sign    Worried About Running Out of Food in the Last Year: Never true    Ran Out of Food in the Last Year: Sometimes true  Transportation Needs: No Transportation Needs (08/03/2023)   PRAPARE - Administrator, Civil Service (Medical): No    Lack of Transportation (Non-Medical): No  Physical Activity: Not on file  Stress: Not on file  Social Connections: Not on file     Vital Signs: There were no vitals taken for this visit.  Physical Exam Constitutional:      General: He is not in acute distress. HENT:     Head: Normocephalic.     Mouth/Throat:     Mouth: Mucous membranes are moist.  Eyes:     General: No scleral icterus. Cardiovascular:     Rate and  Rhythm: Normal rate and regular rhythm.     Pulses: Normal pulses.  Pulmonary:     Effort: No respiratory distress.  Abdominal:     General: There is no distension.     Comments: RLQ drain in place to accordion drain with trace serous fluid.    Musculoskeletal:     Right lower leg: No edema.     Left lower leg: No edema.  Skin:    General: Skin is warm and dry.     Coloration: Skin  is not jaundiced.  Neurological:     Mental Status: He is alert and oriented to person, place, and time.     Imaging: CT AP 08/25/23     Labs:  CBC: Recent Labs    08/03/23 1642 08/04/23 0344 08/05/23 0840 08/06/23 0318  WBC 14.0* 11.3* 8.0 8.8  HGB 11.9* 10.5* 11.2* 11.4*  HCT 39.3 34.8* 36.0* 37.4*  PLT 237 226 275 287    COAGS: Recent Labs    08/04/23 0819  INR 1.1    BMP: Recent Labs    08/03/23 1642 08/04/23 0344 08/05/23 0840 08/06/23 0318  NA 136 138 134* 136  K 3.8 3.6 4.0 3.9  CL 105 108 104 103  CO2 24 23 22 25   GLUCOSE 121* 123* 91 102*  BUN 15 12 10 10   CALCIUM  8.3* 7.6* 8.1* 8.7*  CREATININE 1.27* 1.10 1.04 0.97  GFRNONAA >60 >60 >60 >60    LIVER FUNCTION TESTS: Recent Labs    03/25/23 1121 04/03/23 2228 08/03/23 1642  BILITOT 0.4 0.7 0.3  AST 28 33 18  ALT 26 33 19  ALKPHOS 61 58 55  PROT 6.8 7.5 6.7  ALBUMIN  4.3 4.2 3.3*    Assessment and Plan: 63 year old male with a history of hernia repair 06/30/23 with a post-op abdominal wall abscess. He underwent drain placement in IR 08/04/23. The anterior abdominal wall fluid collection has resolved.  The drain was removed successfully.  Follow up with IR as needed.  Ester Sides, MD Pager: [phone]    I spent a total of 25 Minutes in face to face in clinical consultation, greater than 50% of which was counseling/coordinating care for abdominal wall abscess drain.

## 2023-08-25 ENCOUNTER — Ambulatory Visit
Admission: RE | Admit: 2023-08-25 | Discharge: 2023-08-25 | Disposition: A | Discharge status: Home / Self Care | Payer: Federal, State, Local not specified - PPO | Source: Ambulatory Visit | Attending: General Surgery

## 2023-08-25 ENCOUNTER — Ambulatory Visit
Admission: RE | Admit: 2023-08-25 | Discharge: 2023-08-25 | Disposition: A | Discharge status: Home / Self Care | Payer: Federal, State, Local not specified - PPO | Source: Ambulatory Visit | Attending: General Surgery | Admitting: General Surgery

## 2023-08-25 DIAGNOSIS — K432 Incisional hernia without obstruction or gangrene: Secondary | ICD-10-CM

## 2023-08-25 DIAGNOSIS — T8141XA Infection following a procedure, superficial incisional surgical site, initial encounter: Secondary | ICD-10-CM | POA: Diagnosis not present

## 2023-08-25 DIAGNOSIS — L02211 Cutaneous abscess of abdominal wall: Secondary | ICD-10-CM

## 2023-08-25 DIAGNOSIS — I700 Atherosclerosis of aorta: Secondary | ICD-10-CM | POA: Diagnosis not present

## 2023-08-25 DIAGNOSIS — T8143XD Infection following a procedure, organ and space surgical site, subsequent encounter: Secondary | ICD-10-CM | POA: Diagnosis not present

## 2023-08-25 HISTORY — PX: IR RADIOLOGIST EVAL & MGMT: IMG5224

## 2023-08-25 MED ORDER — IOPAMIDOL (ISOVUE-300) INJECTION 61%
100.0000 mL | Freq: Once | INTRAVENOUS | Status: AC | PRN
Start: 2023-08-25 — End: 2023-08-25
  Administered 2023-08-25: 100 mL via INTRAVENOUS

## 2023-10-09 DIAGNOSIS — K432 Incisional hernia without obstruction or gangrene: Secondary | ICD-10-CM | POA: Diagnosis not present

## 2023-10-09 IMAGING — DX DG KNEE AP/LAT W/ SUNRISE*R*
3 series · 3 of 3 positions shown · non-contrast
Comparison: 12/31/2016

CLINICAL DATA: Right knee pain

EXAM:
RIGHT KNEE 3 VIEWS

[knee ap]
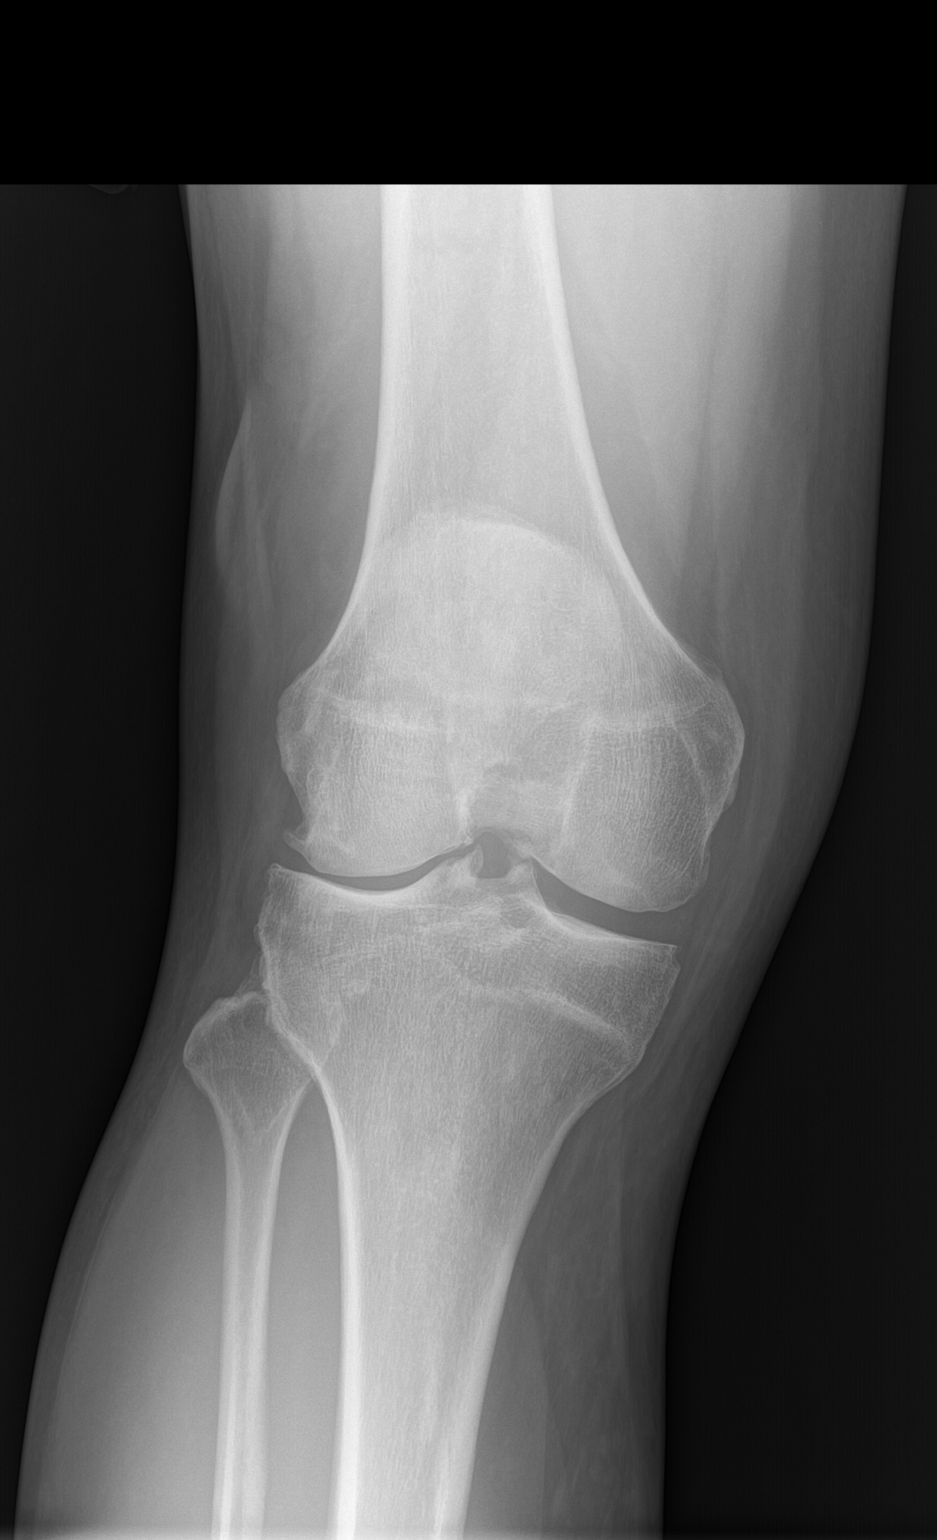

[knee lat]
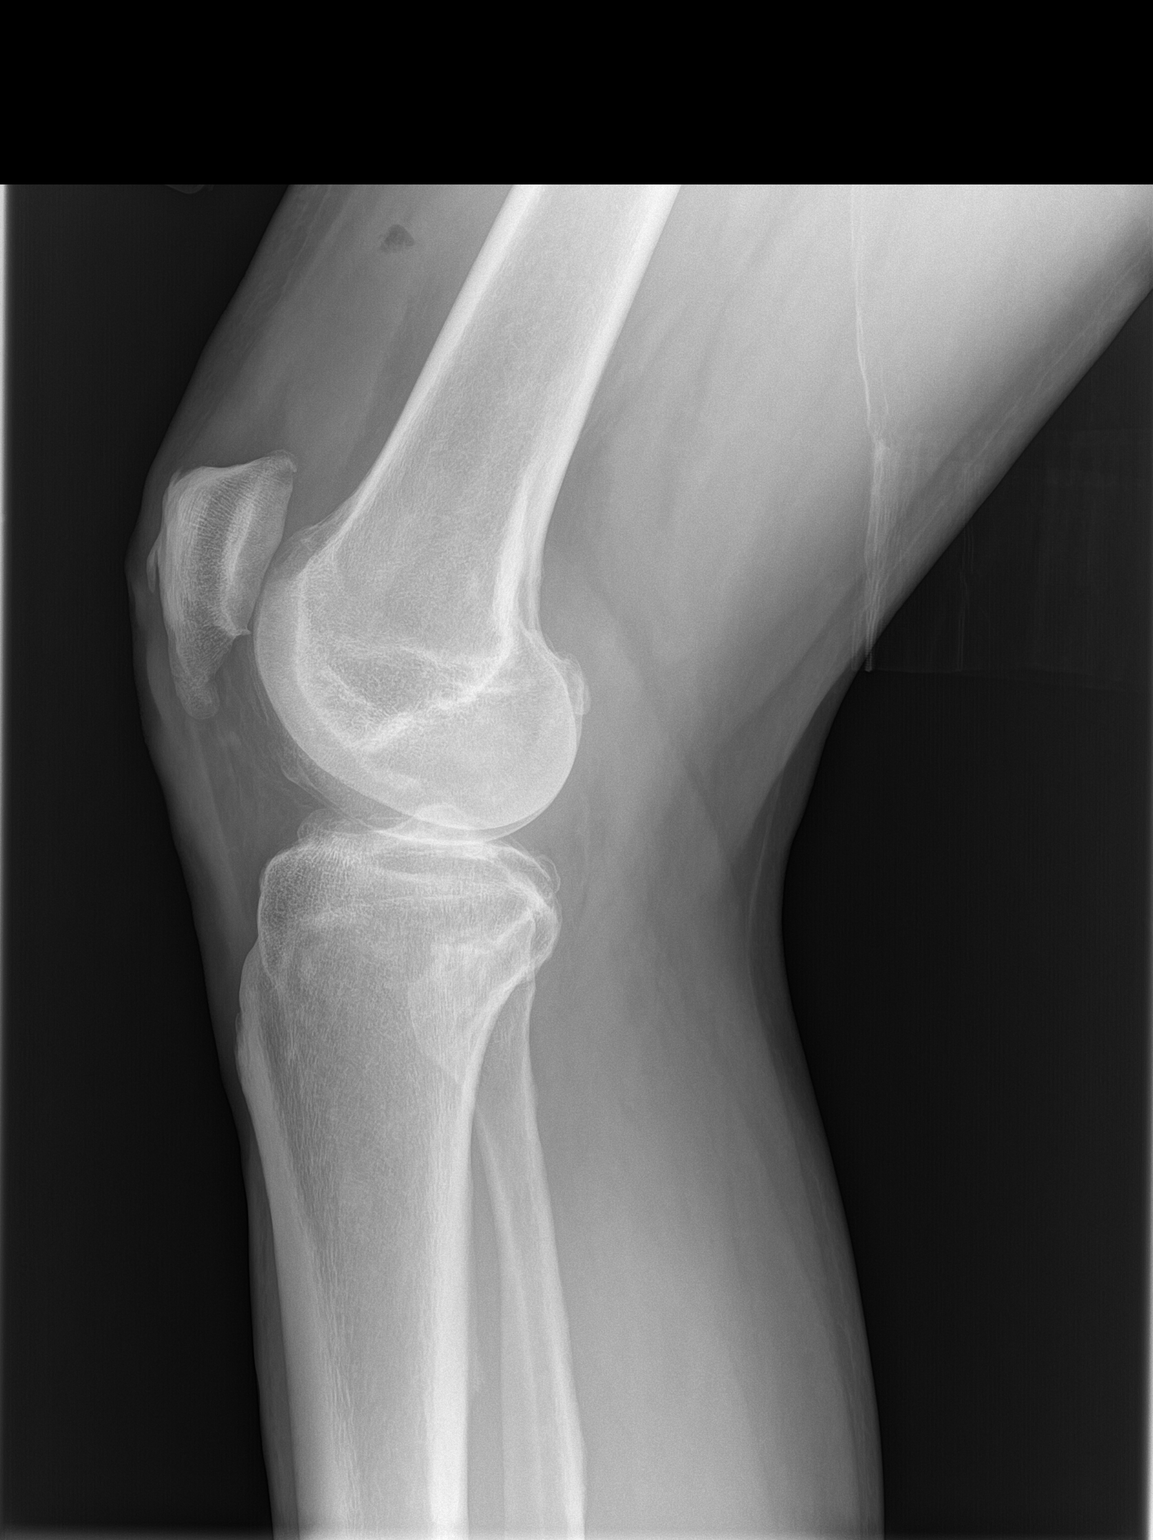

[patella]
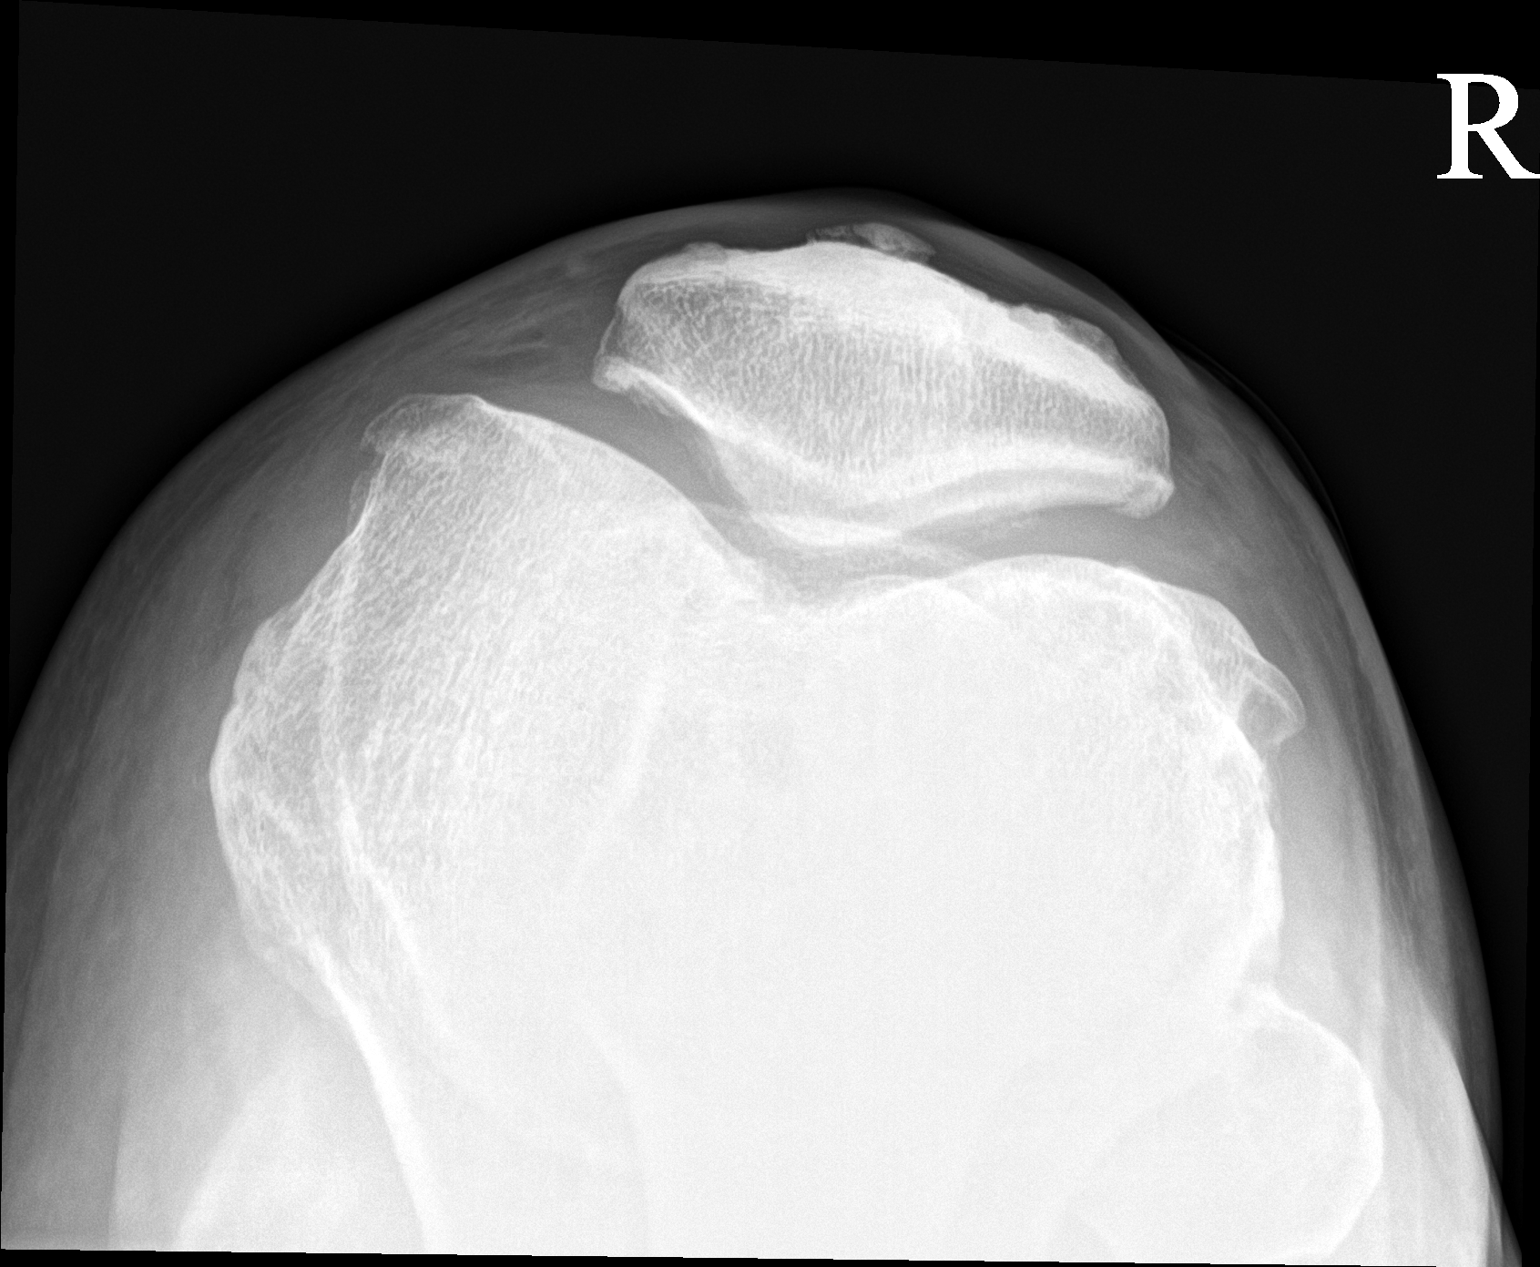

[3 of 3 positions shown; findings below may reference images not displayed]

FINDINGS: Normal alignment. No acute fracture or dislocation. Interval
development of mild to moderate bicompartmental degenerative
arthritis with joint space narrowing and osteophyte formation within
the lateral compartment and tiny osteophyte formation within the
patellofemoral compartment. Moderate right knee effusion is present.
Angular lucency overlying the suprapatellar recess may be
artifactual in nature as this is not seen on additional views. Soft
tissues are otherwise unremarkable.
IMPRESSION: No acute fracture or dislocation.  Moderate right knee effusion.

Development of mild to moderate degenerative arthritis, most severe
within the lateral compartment.

## 2023-11-26 ENCOUNTER — Ambulatory Visit: Payer: Self-pay

## 2023-11-26 NOTE — Telephone Encounter (Signed)
 Noted. Fyi. Scheduled with Soyla Duverney 11/27/2023 at 11:20am

## 2023-11-26 NOTE — Telephone Encounter (Signed)
  Chief Complaint: Dizziness Symptoms: "feels like the room is spinning at times." Reports feeling like he has to hold onto something Frequency: patient states this has been going on and off but today has been constant Pertinent Negatives: Patient denies CP, SOB Disposition: [] ED /[] Urgent Care (no appt availability in office) / [x] Appointment(In office/virtual)/ []  [place] [facility]/ [] Home Care/ [] Refused Recommended Disposition /[] [place] [facility]/ []  Follow-up with PCP Additional Notes: patient calling with in with concerns of dizziness. Patient states he wonders if he has vertigo. Patient states "feels like the room is spinning at times." Also states feels like he has to hold onto something at times to keep from falling. Patient denies that he has fallen. Patient states these symptoms have happened in small episodes but haven't lasted. Patient states today has been the worst but dizziness has calmed down. Patient denies CP and SOB. Per protocol, patient is recommended to see a provider within 24 hours. Appointment made with another provider in PCP office for tomorrow at 11:20 AM. Patient verbalized understanding of plan and all questions answered.    Copied from CRM [id number]. Topic: Clinical - Red Word Triage >> Nov 26, 2023  2:56 PM Adonis Hoot wrote: Red Word that prompted transfer to Nurse Triage: dizziness,possible vertigo Reason for Disposition  [1] MODERATE dizziness (e.g., vertigo; feels very unsteady, interferes with normal activities) AND [2] has NOT been evaluated by doctor (or NP/PA) for this  Answer Assessment - Initial Assessment Questions 1. DESCRIPTION: "Describe your dizziness."     Patient states he feels like the room is spinning at times and feels like he has to hold onto something 2. VERTIGO: "Do you feel like either you or the room is spinning or tilting?"      yes 3. LIGHTHEADED: "Do you feel lightheaded?" (e.g., somewhat faint, woozy, weak upon standing)      Yes at times 4. SEVERITY: "How bad is it?"  "Can you walk?"   - MILD: Feels slightly dizzy and unsteady, but is walking normally.   - MODERATE: Feels unsteady when walking, but not falling; interferes with normal activities (e.g., school, work).   - SEVERE: Unable to walk without falling, or requires assistance to walk without falling.     Moderate worst 5. ONSET:  "When did the dizziness begin?"     Has been going on and off for the last week but has happened on and off 6. AGGRAVATING FACTORS: "Does anything make it worse?" (e.g., standing, change in head position)     Laying down makes it worst 7. CAUSE: "What do you think is causing the dizziness?"     Patient is thinking vertigo 8. RECURRENT SYMPTOM: "Have you had dizziness before?" If Yes, ask: "When was the last time?" "What happened that time?"     Yes patient states this has happened on and off 9. OTHER SYMPTOMS: "Do you have any other symptoms?" (e.g., headache, weakness, numbness, vomiting, earache)     no  Protocols used: Dizziness - Vertigo-A-AH

## 2023-11-27 ENCOUNTER — Ambulatory Visit: Admitting: Nurse Practitioner

## 2023-11-27 ENCOUNTER — Encounter: Payer: Self-pay | Admitting: Nurse Practitioner

## 2023-11-27 VITALS — BP 130/90 | HR 90 | Temp 98.7°F | Ht 74.0 in | Wt 238.6 lb

## 2023-11-27 DIAGNOSIS — F341 Dysthymic disorder: Secondary | ICD-10-CM

## 2023-11-27 DIAGNOSIS — E611 Iron deficiency: Secondary | ICD-10-CM | POA: Diagnosis not present

## 2023-11-27 DIAGNOSIS — I10 Essential (primary) hypertension: Secondary | ICD-10-CM

## 2023-11-27 DIAGNOSIS — R42 Dizziness and giddiness: Secondary | ICD-10-CM

## 2023-11-27 LAB — CBC
HCT: 45.9 % (ref 39.0–52.0)
Hemoglobin: 14.6 g/dL (ref 13.0–17.0)
MCHC: 31.8 g/dL (ref 30.0–36.0)
MCV: 76.5 fl — ABNORMAL LOW (ref 78.0–100.0)
Platelets: 214 10*3/uL (ref 150.0–400.0)
RBC: 6 Mil/uL — ABNORMAL HIGH (ref 4.22–5.81)
RDW: 17.6 % — ABNORMAL HIGH (ref 11.5–15.5)
WBC: 7.1 10*3/uL (ref 4.0–10.5)

## 2023-11-27 LAB — IBC + FERRITIN
Ferritin: 11.5 ng/mL — ABNORMAL LOW (ref 22.0–322.0)
Iron: 50 ug/dL (ref 42–165)
Saturation Ratios: 11.1 % — ABNORMAL LOW (ref 20.0–50.0)
TIBC: 449.4 ug/dL (ref 250.0–450.0)
Transferrin: 321 mg/dL (ref 212.0–360.0)

## 2023-11-27 LAB — BASIC METABOLIC PANEL WITH GFR
BUN: 10 mg/dL (ref 6–23)
CO2: 26 meq/L (ref 19–32)
Calcium: 9 mg/dL (ref 8.4–10.5)
Chloride: 106 meq/L (ref 96–112)
Creatinine, Ser: 1.01 mg/dL (ref 0.40–1.50)
GFR: 78.58 mL/min (ref >60.00)
Glucose, Bld: 99 mg/dL (ref 70–99)
Potassium: 4.4 meq/L (ref 3.5–5.1)
Sodium: 138 meq/L (ref 135–145)

## 2023-11-27 LAB — TSH: TSH: 0.9 u[IU]/mL (ref 0.35–5.50)

## 2023-11-27 MED ORDER — AMLODIPINE BESYLATE 5 MG PO TABS: 5.0000 mg | ORAL_TABLET | Freq: Every day | ORAL | 1 refills | Status: DC

## 2023-11-27 NOTE — Progress Notes (Signed)
 Acute Office Visit  Subjective:    Patient ID: Jonathan Edwards, Jonathan Edwards    DOB: Feb 20, 1961, 63 y.o.   MRN: [id number]  Chief Complaint  Patient presents with   Dizziness    States it started getting worst Sunday (11/23/2023)   Dizziness This is a recurrent problem. The current episode started more than 1 year ago. The problem occurs intermittently. The problem has been gradually worsening (worse in last 5days). Associated symptoms include vertigo. Pertinent negatives include no abdominal pain, anorexia, arthralgias, change in bowel habit, chest pain, chills, congestion, coughing, diaphoresis, fatigue, fever, headaches, joint swelling, myalgias, nausea, neck pain, numbness, rash, sore throat, swollen glands, urinary symptoms, visual change, vomiting or weakness. Associated symptoms comments: No tinnitus, no SOB, no palpitation. The symptoms are aggravated by standing (laying down). He has tried rest for the symptoms. The treatment provided mild relief.  He drinks only 16oz of water daily. Mainly consumes sports drinks.   Outpatient Medications Prior to Visit  Medication Sig   Cholecalciferol (VITAMIN D) 50 MCG (2000 UT) tablet Take 2,000 Units by mouth daily.   Cyanocobalamin (B-12) 2500 MCG TABS Take 2,500 mcg by mouth daily.   cyclobenzaprine (FLEXERIL) 10 MG tablet Take 10 mg by mouth 3 (three) times daily as needed for muscle spasms.   lisinopril (ZESTRIL) 20 MG tablet TAKE 1 TABLET BY MOUTH EVERY DAY   Magnesium 250 MG CAPS Take 250 mg by mouth daily.   Multiple Vitamin (MULTIVITAMIN WITH MINERALS) TABS tablet Take 1 tablet by mouth daily.   Omega 3-6-9 Fatty Acids (TRIPLE OMEGA COMPLEX PO) Take 1 capsule by mouth daily.   zinc gluconate 50 MG tablet Take 50 mg by mouth daily.   [DISCONTINUED] amLODipine (NORVASC) 5 MG tablet TAKE 1 TABLET (5 MG TOTAL) BY MOUTH DAILY.   [DISCONTINUED] buPROPion (WELLBUTRIN XL) 150 MG 24 hr tablet Take 1 tablet (150 mg total) by mouth in the morning and at  bedtime.   buPROPion (WELLBUTRIN XL) 150 MG 24 hr tablet Take 1 tablet (150 mg total) by mouth daily.   [DISCONTINUED] Iron, Ferrous Sulfate, 325 (65 Fe) MG TABS Take 325 mg by mouth every other day. (Patient not taking: Reported on 11/27/2023)   No facility-administered medications prior to visit.   Reviewed past medical and social history.  Review of Systems  Constitutional:  Negative for chills, diaphoresis, fatigue and fever.  HENT:  Negative for congestion and sore throat.   Respiratory:  Negative for cough.   Cardiovascular:  Negative for chest pain.  Gastrointestinal:  Negative for abdominal pain, anorexia, change in bowel habit, nausea and vomiting.  Musculoskeletal:  Negative for arthralgias, joint swelling, myalgias and neck pain.  Skin:  Negative for rash.  Neurological:  Positive for dizziness and vertigo. Negative for weakness, numbness and headaches.   Per HPI     Objective:    Physical Exam Vitals and nursing note reviewed.  Eyes:     General: Lids are normal.     Extraocular Movements: Extraocular movements intact.     Right eye: Normal extraocular motion and no nystagmus.     Left eye: Normal extraocular motion and no nystagmus.     Conjunctiva/sclera: Conjunctivae normal.  Cardiovascular:     Rate and Rhythm: Normal rate and regular rhythm.     Pulses: Normal pulses.     Heart sounds: Normal heart sounds.  Pulmonary:     Effort: Pulmonary effort is normal.     Breath sounds: Normal breath sounds.  Musculoskeletal:  Cervical back: Normal range of motion and neck supple.     Right lower leg: No edema.     Left lower leg: No edema.  Neurological:     Mental Status: He is alert and oriented to person, place, and time.     Cranial Nerves: Cranial nerves 2-12 are intact.     Motor: Motor function is intact.     Coordination: Coordination is intact.  Psychiatric:        Mood and Affect: Mood normal.        Behavior: Behavior normal.        Thought Content:  Thought content normal.    BP (!) 130/90 (BP Location: Left Arm, Patient Position: Standing, Cuff Size: Normal)   Pulse 90   Temp 98.7 F (37.1 C) (Temporal)   Ht 6\' 2"  (1.88 m)   Wt 238 lb 9.6 oz (108.2 kg)   SpO2 93%   BMI 30.63 kg/m    No results found for any visits on 11/27/23.     Assessment & Plan:   Problem List Items Addressed This Visit     Dysthymia   He opted to decrease wellbutrin dose to 150mg  daily instead of 150mg  BID. Dose changed several months ago.      Relevant Medications   buPROPion (WELLBUTRIN XL) 150 MG 24 hr tablet   Essential hypertension   Compliant with lisinopril 20mg  daily He stopped taking amlodipine 5mg . BP Readings from Last 3 Encounters:  11/27/23 (!) 130/90  08/25/23 (!) 142/90  08/06/23 (!) 143/99    BP not at goal Advised to resume amlodipine dose and maintain lisinopril dose Repeat BMP Maintain f/up with pcp      Relevant Medications   amLODipine (NORVASC) 5 MG tablet   Other Relevant Orders   Basic metabolic panel with GFR   Iron deficiency   Repeat cbc and iron panel      Relevant Orders   CBC   IBC + Ferritin   Other Visit Diagnoses       Vertigo    -  Primary   Relevant Orders   CBC   Basic metabolic panel with GFR   IBC + Ferritin   TSH      Meds ordered this encounter  Medications   amLODipine (NORVASC) 5 MG tablet    Sig: Take 1 tablet (5 mg total) by mouth daily.    Dispense:  90 tablet    Refill:  1    Supervising Provider:   Tonna Frederic [[id number]]   Return for f/up with pcp as scheduled.    Kathrene Parents, NP

## 2023-11-27 NOTE — Assessment & Plan Note (Signed)
Repeat cbc and iron panel

## 2023-11-27 NOTE — Patient Instructions (Signed)
 Go to lab Start vertigo exercise, once a day'maintain at least 60oz of water daily Call office if no improvement in 1week  Vertigo Vertigo is the feeling that you or the things around you are moving or spinning when they're not. It's different than feeling dizzy. It can also cause: Loss of balance. Trouble standing or walking. Nausea and vomiting. This feeling can come and go at any time. It can last from a few seconds to minutes or even hours. It may go away on its own or be treated with medicine. What are the types of vertigo? There are two types of vertigo: Peripheral vertigo happens when parts of your inner ear don't work like they should. This is the more common type. Central vertigo happens when your brain and spinal cord don't work like they should. Your health care provider will do tests to find out what kind of vertigo you have. This will help them decide on the right treatment for you. Follow these instructions at home: Eating and drinking Drink enough fluid to keep your pee (urine) pale yellow. Do not drink alcohol. Activity When you get up in the morning, first sit up on the side of the bed. When you feel okay, stand slowly while holding onto something. Move slowly. Avoid sudden body or head movements. Avoid certain positions, as told by your provider. Use a cane if you have trouble standing or walking. Sit down right away if you feel unsteady. Place items in your home so they're easy for you to reach without bending or leaning over. Return to normal activities when you're told. Ask what things are safe for you to do. General instructions Take your medicines only as told by your provider. Contact a health care provider if: Your medicines don't help or make your vertigo worse. You get new symptoms. You have a fever. You have nausea or vomiting. Your family or friends spot any changes in how you're acting. A part of your body goes numb. You feel tingling and prickling in  a part of your body. You get very bad headaches. Get help right away if: You're always dizzy or you faint. You have a stiff neck. You have trouble moving or speaking. Your hands, arms, or legs feel weak. Your hearing or eyesight changes. These symptoms may be an emergency. Call 911 right away. Do not wait to see if the symptoms will go away. Do not drive yourself to the hospital. This information is not intended to replace advice given to you by your health care provider. Make sure you discuss any questions you have with your health care provider. Document Revised: 05/01/2023 Document Reviewed: 11/01/2022 Elsevier Patient Education  2024 [employer].

## 2023-11-27 NOTE — Assessment & Plan Note (Signed)
 He opted to decrease wellbutrin dose to 150mg  daily instead of 150mg  BID. Dose changed several months ago.

## 2023-11-27 NOTE — Assessment & Plan Note (Signed)
 Compliant with lisinopril 20mg  daily He stopped taking amlodipine 5mg . BP Readings from Last 3 Encounters:  11/27/23 (!) 130/90  08/25/23 (!) 142/90  08/06/23 (!) 143/99    BP not at goal Advised to resume amlodipine dose and maintain lisinopril dose Repeat BMP Maintain f/up with pcp

## 2023-11-30 ENCOUNTER — Encounter: Payer: Self-pay | Admitting: Nurse Practitioner

## 2024-02-05 NOTE — Progress Notes (Signed)
 [facility] [address] [place] [address] Phone: [phone] Subjective:   Jonathan Edwards, am serving as a scribe for Dr. Arthea Claudene.  I'm seeing this patient by the request  of:  Berneta Elsie Sayre, MD  CC: Knee pain follow-up  YEP:Dlagzrupcz  06/26/2023 Significant lateral compartment arthritis. Doing relatively well though with conservative therapy. Still affecting some daily activities. We do have bracing noted. Sometimes does have flares from time to time, sometimes does have evidence instability. Patient still wants to hold on any type of surgical intervention. Follow-up again in 3 to 4 months.   Update 02/09/2024 Jonathan Edwards is a 63 y.o. male coming in with complaint of R knee pain.  Known lateral compartment severe arthritis.  Patient states that he has sharp pain intermittently. Swelling seems chronic.   Since we have seen patient did have an abdominal hernia repair with complications of an abscess.    Past Medical History:  Diagnosis Date   Anxiety    Cancer [facility])    prostate  2021   Cataract    Depression    GERD (gastroesophageal reflux disease)    Glaucoma    Hypertension    Past Surgical History:  Procedure Laterality Date   COLONOSCOPY  2011   EYE SURGERY     hole repair left eye and detached retina surgery   HERNIA REPAIR     umbilical with mesh   INCISIONAL HERNIA REPAIR N/A 06/30/2023   Procedure: open incisional hernia repair with mesh;  Surgeon: Polly Cordella LABOR, MD;  Location: [facility];  Service: General;  Laterality: N/A;   IR RADIOLOGIST EVAL & MGMT  08/25/2023   LAPAROSCOPIC LYSIS OF ADHESIONS N/A 03/06/2020   Procedure: LAPAROSCOPIC LYSIS OF ADHESIONS;  Surgeon: Carolee Sherwood JONETTA DOUGLAS, MD;  Location: [facility];  Service: Urology;  Laterality: N/A;   LAPAROTOMY N/A 07/02/2023   Procedure: EXPLORATORY LAPAROTOMY WITH HEMATOMA WASH OUT;  Surgeon: Polly Cordella LABOR, MD;  Location: [facility];  Service: General;   Laterality: N/A;   PELVIC LYMPH NODE DISSECTION Bilateral 03/06/2020   Procedure: BILATERAL PELVIC LYMPH NODE DISSECTION;  Surgeon: Carolee Sherwood JONETTA DOUGLAS, MD;  Location: [facility];  Service: Urology;  Laterality: Bilateral;   ROBOT ASSISTED LAPAROSCOPIC RADICAL PROSTATECTOMY N/A 03/06/2020   Procedure: XI ROBOTIC ASSISTED LAPAROSCOPIC RADICAL PROSTATECTOMY;  Surgeon: Carolee Sherwood JONETTA DOUGLAS, MD;  Location: [facility];  Service: Urology;  Laterality: N/A;   TONSILLECTOMY     Social History   Socioeconomic History   Marital status: Married    Spouse name: Not on file   Number of children: Not on file   Years of education: Not on file   Highest education level: Not on file  Occupational History   Not on file  Tobacco Use   Smoking status: Former    Current packs/day: 0.00    Types: Cigarettes    Start date: 08/12/1968    Quit date: 08/12/1998    Years since quitting: 25.5   Smokeless tobacco: Never  Vaping Use   Vaping status: Never Used  Substance and Sexual Activity   Alcohol use: Yes    Comment: occasional   Drug use: No   Sexual activity: Yes    Birth control/protection: None  Other Topics Concern   Not on file  Social History Narrative   Not on file   Social Drivers of Health   Financial Resource Strain: Not on file  Food Insecurity: No Food Insecurity (08/03/2023)  Hunger Vital Sign    Worried About Running Out of Food in the Last Year: Never true    Ran Out of Food in the Last Year: Never true  Recent Concern: Food Insecurity - Food Insecurity Present (06/30/2023)   Hunger Vital Sign    Worried About Running Out of Food in the Last Year: Never true    Ran Out of Food in the Last Year: Sometimes true  Transportation Needs: No Transportation Needs (08/03/2023)   PRAPARE - Administrator, Civil Service (Medical): No    Lack of Transportation (Non-Medical): No  Physical Activity: Not on file  Stress: Not on file  Social Connections: Not on file   No Known  Allergies Family History  Problem Relation Age of Onset   Colon cancer Neg Hx    Colon polyps Neg Hx    Esophageal cancer Neg Hx    Rectal cancer Neg Hx    Stomach cancer Neg Hx      Current Outpatient Medications (Cardiovascular):    amLODipine  (NORVASC ) 5 MG tablet, Take 1 tablet (5 mg total) by mouth daily.   lisinopril  (ZESTRIL ) 20 MG tablet, TAKE 1 TABLET BY MOUTH EVERY DAY    Current Outpatient Medications (Hematological):    Cyanocobalamin (B-12) 2500 MCG TABS, Take 2,500 mcg by mouth daily.  Current Outpatient Medications (Other):    buPROPion  (WELLBUTRIN  XL) 150 MG 24 hr tablet, Take 1 tablet (150 mg total) by mouth daily.   Cholecalciferol (VITAMIN D ) 50 MCG (2000 UT) tablet, Take 2,000 Units by mouth daily.   cyclobenzaprine  (FLEXERIL ) 10 MG tablet, Take 10 mg by mouth 3 (three) times daily as needed for muscle spasms.   Magnesium 250 MG CAPS, Take 250 mg by mouth daily.   Multiple Vitamin (MULTIVITAMIN WITH MINERALS) TABS tablet, Take 1 tablet by mouth daily.   Omega 3-6-9 Fatty Acids (TRIPLE OMEGA COMPLEX PO), Take 1 capsule by mouth daily.   zinc gluconate 50 MG tablet, Take 50 mg by mouth daily.   Reviewed prior external information including notes and imaging from  primary care provider As well as notes that were available from care everywhere and other healthcare systems.  Past medical history, social, surgical and family history all reviewed in electronic medical record.  No pertanent information unless stated regarding to the chief complaint.   Review of Systems:  No headache, visual changes, nausea, vomiting, diarrhea, constipation, dizziness, abdominal pain, skin rash, fevers, chills, night sweats, weight loss, swollen lymph nodes, body aches, joint swelling, chest pain, shortness of breath, mood changes. POSITIVE muscle aches  Objective  Blood pressure 120/78, pulse 72, height 6' 2 (1.88 m), weight 243 lb (110.2 kg), SpO2 98%.   General: No apparent  distress alert and oriented x3 mood and affect normal, dressed appropriately.  HEENT: Pupils equal, extraocular movements intact  Respiratory: Patient's speak in full sentences and does not appear short of breath  Cardiovascular: No lower extremity edema, non tender, no erythema  Right knee exam shows that there is lateral compartment narrowing noted.  Crepitus noted.  Tender to palpation noted. Mild instability with valgus and varus force.  After informed written and verbal consent, patient was seated on exam table. Right knee was prepped with alcohol swab and utilizing anterolateral approach, patient's right knee space was injected with 4:1  marcaine  0.5%: Kenalog  40mg /dL. Patient tolerated the procedure well without immediate complications.   Impression and Recommendations:     The above documentation has been reviewed and is  accurate and complete Anthonny Schiller M Khalon Cansler, DO

## 2024-02-09 ENCOUNTER — Encounter: Payer: Self-pay | Admitting: Family Medicine

## 2024-02-09 ENCOUNTER — Ambulatory Visit: Admitting: Family Medicine

## 2024-02-09 VITALS — BP 120/78 | HR 72 | Ht 74.0 in | Wt 243.0 lb

## 2024-02-09 DIAGNOSIS — M1711 Unilateral primary osteoarthritis, right knee: Secondary | ICD-10-CM

## 2024-02-09 NOTE — Assessment & Plan Note (Signed)
 Chronic problem with worsening pain, some of it is some atrophy secondary to not being active after patient's hernia surgery that was complicated by an abscess.  No sign of any significant redness or warmth.  Given an injection and hopeful that this will be beneficial.  Discussed the importance of keeping BMI under 35 and if anything under 30 would be better.  Goal weight would be at least 225.  Follow-up with me again 2 to 3 months.  Could be a candidate for viscosupplementation.

## 2024-02-09 NOTE — Patient Instructions (Signed)
See me again in 3 months.

## 2024-02-17 ENCOUNTER — Encounter: Payer: Self-pay | Admitting: Family Medicine

## 2024-02-17 ENCOUNTER — Ambulatory Visit: Payer: Self-pay | Admitting: Family Medicine

## 2024-02-17 ENCOUNTER — Telehealth: Payer: Self-pay | Admitting: Family Medicine

## 2024-02-17 ENCOUNTER — Ambulatory Visit: Admitting: Family Medicine

## 2024-02-17 VITALS — BP 134/78 | HR 69 | Temp 96.7°F | Ht 74.0 in | Wt 239.2 lb

## 2024-02-17 DIAGNOSIS — I10 Essential (primary) hypertension: Secondary | ICD-10-CM | POA: Diagnosis not present

## 2024-02-17 DIAGNOSIS — Z8546 Personal history of malignant neoplasm of prostate: Secondary | ICD-10-CM

## 2024-02-17 DIAGNOSIS — Z131 Encounter for screening for diabetes mellitus: Secondary | ICD-10-CM | POA: Diagnosis not present

## 2024-02-17 DIAGNOSIS — Z0000 Encounter for general adult medical examination without abnormal findings: Secondary | ICD-10-CM

## 2024-02-17 DIAGNOSIS — E559 Vitamin D deficiency, unspecified: Secondary | ICD-10-CM

## 2024-02-17 DIAGNOSIS — Z13220 Encounter for screening for lipoid disorders: Secondary | ICD-10-CM

## 2024-02-17 DIAGNOSIS — E611 Iron deficiency: Secondary | ICD-10-CM

## 2024-02-17 DIAGNOSIS — N5234 Erectile dysfunction following simple prostatectomy: Secondary | ICD-10-CM

## 2024-02-17 DIAGNOSIS — N5201 Erectile dysfunction due to arterial insufficiency: Secondary | ICD-10-CM

## 2024-02-17 DIAGNOSIS — F341 Dysthymic disorder: Secondary | ICD-10-CM

## 2024-02-17 LAB — COMPREHENSIVE METABOLIC PANEL WITH GFR
ALT: 19 U/L (ref 0–53)
AST: 20 U/L (ref 0–37)
Albumin: 4.1 g/dL (ref 3.5–5.2)
Alkaline Phosphatase: 53 U/L (ref 39–117)
BUN: 13 mg/dL (ref 6–23)
CO2: 29 meq/L (ref 19–32)
Calcium: 8.9 mg/dL (ref 8.4–10.5)
Chloride: 104 meq/L (ref 96–112)
Creatinine, Ser: 1.18 mg/dL (ref 0.40–1.50)
GFR: 65.09 mL/min (ref >60.00)
Glucose, Bld: 93 mg/dL (ref 70–99)
Potassium: 4.3 meq/L (ref 3.5–5.1)
Sodium: 140 meq/L (ref 135–145)
Total Bilirubin: 0.6 mg/dL (ref 0.2–1.2)
Total Protein: 6.5 g/dL (ref 6.0–8.3)

## 2024-02-17 LAB — URINALYSIS, ROUTINE W REFLEX MICROSCOPIC
Bilirubin Urine: NEGATIVE
Hgb urine dipstick: NEGATIVE
Ketones, ur: NEGATIVE
Leukocytes,Ua: NEGATIVE
Nitrite: NEGATIVE
Specific Gravity, Urine: 1.015 (ref 1.000–1.030)
Total Protein, Urine: NEGATIVE
Urine Glucose: NEGATIVE
Urobilinogen, UA: 0.2 (ref 0.0–1.0)
pH: 7 (ref 5.0–8.0)

## 2024-02-17 LAB — LIPID PANEL
Cholesterol: 173 mg/dL (ref 0–200)
HDL: 42.6 mg/dL (ref >39.00)
LDL Cholesterol: 104 mg/dL — ABNORMAL HIGH (ref 0–99)
NonHDL: 129.98
Total CHOL/HDL Ratio: 4
Triglycerides: 129 mg/dL (ref 0.0–149.0)
VLDL: 25.8 mg/dL (ref 0.0–40.0)

## 2024-02-17 LAB — CBC
HCT: 45.1 % (ref 39.0–52.0)
Hemoglobin: 14.6 g/dL (ref 13.0–17.0)
MCHC: 32.4 g/dL (ref 30.0–36.0)
MCV: 77.7 fl — ABNORMAL LOW (ref 78.0–100.0)
Platelets: 213 K/uL (ref 150.0–400.0)
RBC: 5.81 Mil/uL (ref 4.22–5.81)
RDW: 17.4 % — ABNORMAL HIGH (ref 11.5–15.5)
WBC: 8.2 K/uL (ref 4.0–10.5)

## 2024-02-17 LAB — HEMOGLOBIN A1C: Hgb A1c MFr Bld: 6 % (ref 4.6–6.5)

## 2024-02-17 LAB — VITAMIN D 25 HYDROXY (VIT D DEFICIENCY, FRACTURES): VITD: 43.68 ng/mL (ref 30.00–100.00)

## 2024-02-17 LAB — PSA: PSA: 0 ng/mL — ABNORMAL LOW (ref 0.10–4.00)

## 2024-02-17 MED ORDER — TADALAFIL 5 MG PO TABS: 5.0000 mg | ORAL_TABLET | Freq: Every day | ORAL | 11 refills | Status: DC

## 2024-02-17 MED ORDER — TADALAFIL 5 MG PO TABS: ORAL_TABLET | ORAL | 5 refills | Status: AC

## 2024-02-17 NOTE — Telephone Encounter (Signed)
 Tadalafil  is OON with his insurance, Cialis  is a lot cheaper.

## 2024-02-17 NOTE — Progress Notes (Signed)
 Established Patient Office Visit   Subjective:  Patient ID: Jonathan Edwards, male    DOB: November 24, 1960  Age: 63 y.o. MRN: [id number]  Chief Complaint  Patient presents with   Erectile Dysfunction    Pt in office for Rx for Tadalafil .     Erectile Dysfunction   Encounter Diagnoses  Name Primary?   Healthcare maintenance Yes   Essential hypertension    Iron  deficiency    History of prostate cancer    Screening for diabetes mellitus    Erectile dysfunction due to arterial insufficiency    Screening for cholesterol level    Dysthymia    Vitamin D  deficiency    For physical and follow-up of above.  He is fasting this morning.  Continues to work a physical job but is planning his retirement.  He is also working-3 hours daily before he comes in to work.  Continues to struggle with the ED after prostatectomy for cancer 4 years ago.  He has used the injections but would be interested in trying tadalafil  again.  He has access to dental care but cannot afford to go at this time.  Wellbutrin  is definitely helping with his depression that is centered around his work stress and sequelae of prostate surgery.  Continues amlodipine  and lisinopril  blood pressure.  Continues Wellbutrin  for the   Review of Systems  Constitutional: Negative.   HENT: Negative.    Eyes:  Negative for blurred vision, discharge and redness.  Respiratory: Negative.    Cardiovascular: Negative.   Gastrointestinal:  Negative for abdominal pain.  Genitourinary: Negative.   Musculoskeletal: Negative.  Negative for myalgias.  Skin:  Negative for rash.  Neurological:  Negative for tingling, loss of consciousness and weakness.  Endo/Heme/Allergies:  Negative for polydipsia.     Current Outpatient Medications:    amLODipine  (NORVASC ) 5 MG tablet, Take 1 tablet (5 mg total) by mouth daily., Disp: 90 tablet, Rfl: 1   buPROPion  (WELLBUTRIN  XL) 150 MG 24 hr tablet, Take 1 tablet (150 mg total) by mouth daily., Disp: , Rfl:     Cholecalciferol (VITAMIN D ) 50 MCG (2000 UT) tablet, Take 2,000 Units by mouth daily., Disp: , Rfl:    Cyanocobalamin (B-12) 2500 MCG TABS, Take 2,500 mcg by mouth daily., Disp: , Rfl:    cyclobenzaprine  (FLEXERIL ) 10 MG tablet, Take 10 mg by mouth 3 (three) times daily as needed for muscle spasms., Disp: , Rfl:    lisinopril  (ZESTRIL ) 20 MG tablet, TAKE 1 TABLET BY MOUTH EVERY DAY, Disp: 90 tablet, Rfl: 3   Magnesium 250 MG CAPS, Take 250 mg by mouth daily., Disp: , Rfl:    Multiple Vitamin (MULTIVITAMIN WITH MINERALS) TABS tablet, Take 1 tablet by mouth daily., Disp: , Rfl:    Omega 3-6-9 Fatty Acids (TRIPLE OMEGA COMPLEX PO), Take 1 capsule by mouth daily., Disp: , Rfl:    tadalafil  (CIALIS ) 5 MG tablet, Take 1 tablet (5 mg total) by mouth daily., Disp: 30 tablet, Rfl: 11   zinc gluconate 50 MG tablet, Take 50 mg by mouth daily., Disp: , Rfl:    Objective:     BP 134/78 (BP Location: Left Arm, Patient Position: Sitting, Cuff Size: Large)   Pulse 69   Temp (!) 96.7 F (35.9 C) (Temporal)   Ht 6' 2 (1.88 m)   Wt 239 lb 3.2 oz (108.5 kg)   SpO2 92%   BMI 30.71 kg/m  Wt Readings from Last 3 Encounters:  02/17/24 239 lb 3.2 oz (108.5  kg)  02/09/24 243 lb (110.2 kg)  11/27/23 238 lb 9.6 oz (108.2 kg)      Physical Exam Constitutional:      General: He is not in acute distress.    Appearance: Normal appearance. He is not ill-appearing, toxic-appearing or diaphoretic.  HENT:     Head: Normocephalic and atraumatic.     Right Ear: Tympanic membrane, ear canal and external ear normal.     Left Ear: Tympanic membrane, ear canal and external ear normal.     Mouth/Throat:     Mouth: Mucous membranes are moist.     Pharynx: Oropharynx is clear. No oropharyngeal exudate or posterior oropharyngeal erythema.  Eyes:     General: No scleral icterus.       Right eye: No discharge.        Left eye: No discharge.     Extraocular Movements: Extraocular movements intact.      Conjunctiva/sclera: Conjunctivae normal.     Pupils: Pupils are equal, round, and reactive to light.  Cardiovascular:     Rate and Rhythm: Normal rate and regular rhythm.  Pulmonary:     Effort: Pulmonary effort is normal. No respiratory distress.     Breath sounds: Normal breath sounds.  Abdominal:     General: Bowel sounds are normal.     Tenderness: There is no abdominal tenderness. There is no guarding.  Musculoskeletal:     Cervical back: No rigidity or tenderness.  Skin:    General: Skin is warm and dry.  Neurological:     Mental Status: He is alert and oriented to person, place, and time.  Psychiatric:        Mood and Affect: Mood normal.        Behavior: Behavior normal.      No results found for any visits on 02/17/24.    The 10-year ASCVD risk score (Arnett DK, et al., 2019) is: 17.4%    Assessment & Plan:   Healthcare maintenance  Essential hypertension -     CBC -     Comprehensive metabolic panel with GFR -     Urinalysis, Routine w reflex microscopic  Iron  deficiency -     CBC -     Iron , TIBC and Ferritin Panel  History of prostate cancer -     PSA  Screening for diabetes mellitus -     Comprehensive metabolic panel with GFR -     Hemoglobin A1c  Erectile dysfunction due to arterial insufficiency -     Tadalafil ; Take 1 tablet (5 mg total) by mouth daily.  Dispense: 30 tablet; Refill: 11  Screening for cholesterol level -     Lipid panel  Dysthymia  Vitamin D  deficiency -     VITAMIN D  25 Hydroxy (Vit-D Deficiency, Fractures)    Return in about 3 months (around 05/19/2024), or Most likely we will need to be following up on what is being mentioned today..  Continue all medications as above.  Information was given on health invention.   Elsie Sim Lent, MD

## 2024-02-17 NOTE — Telephone Encounter (Signed)
 Pt is wanting Cialis  not the generic Tadalafil .

## 2024-02-18 ENCOUNTER — Telehealth: Payer: Self-pay

## 2024-02-18 LAB — IRON,TIBC AND FERRITIN PANEL
%SAT: 28 % (ref 20–48)
Ferritin: 39 ng/mL (ref 24–380)
Iron: 107 ug/dL (ref 50–180)
TIBC: 380 ug/dL (ref 250–425)

## 2024-02-18 NOTE — Telephone Encounter (Signed)
 Copied from CRM [id number]. Topic: Clinical - Prescription Issue >> Feb 17, 2024  4:18 PM Aisha D wrote: Reason for CRM: Pt stated that the his insurance will not cover the Tadalafil  and will only cover the Cialis . Pt stated that the medication keeps being sent to the pharmacy for Tadalafil  (Cialis ) and it needs to say Cialis  5MG  (Tadalafil ). Pt stated that his insurance will cover the name brand but not the generic. Pt would like for the medication to be resent and receive a callback with an update on this request. >> Feb 17, 2024  4:37 PM Lavanda D wrote: Patient said that the pharmacy only carries Cialis  and the copay isn't too expensive so he will go with this medication.

## 2024-02-18 NOTE — Telephone Encounter (Signed)
 Co-pay only $18 and he will pick it up. SABRAdm

## 2024-03-26 ENCOUNTER — Ambulatory Visit (INDEPENDENT_AMBULATORY_CARE_PROVIDER_SITE_OTHER): Payer: Federal, State, Local not specified - PPO | Admitting: Family Medicine

## 2024-03-26 ENCOUNTER — Encounter: Payer: Self-pay | Admitting: Family Medicine

## 2024-03-26 VITALS — BP 128/76 | HR 76 | Temp 97.4°F | Ht 74.0 in | Wt 241.8 lb

## 2024-03-26 DIAGNOSIS — I10 Essential (primary) hypertension: Secondary | ICD-10-CM

## 2024-03-26 DIAGNOSIS — E7800 Pure hypercholesterolemia, unspecified: Secondary | ICD-10-CM

## 2024-03-26 MED ORDER — ATORVASTATIN CALCIUM 20 MG PO TABS
20.0000 mg | ORAL_TABLET | Freq: Every day | ORAL | 3 refills | Status: AC
Start: 2024-03-26 — End: ?

## 2024-03-26 MED ORDER — LISINOPRIL 20 MG PO TABS: 20.0000 mg | ORAL_TABLET | Freq: Every day | ORAL | 3 refills | Status: AC

## 2024-03-26 NOTE — Progress Notes (Signed)
 Established Patient Office Visit   Subjective:  Patient ID: Jonathan Edwards, male    DOB: 09-11-1960  Age: 63 y.o. MRN: [id number]  Chief Complaint  Patient presents with   Annual Exam    CPE. Pt is fasting.     HPI Encounter Diagnoses  Name Primary?   Elevated LDL cholesterol level Yes   Essential hypertension    Doing well.  Continues to work second shift and gets off at midnight.  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  His triplets are all playing college.  Here to discuss elevated ASCVD risk score secondary to age and prediabetes.   Review of Systems  Constitutional: Negative.   HENT: Negative.    Eyes:  Negative for blurred vision, discharge and redness.  Respiratory: Negative.    Cardiovascular: Negative.   Gastrointestinal:  Negative for abdominal pain.  Genitourinary: Negative.   Musculoskeletal: Negative.  Negative for myalgias.  Skin:  Negative for rash.  Neurological:  Negative for tingling, loss of consciousness and weakness.  Endo/Heme/Allergies:  Negative for polydipsia.     Current Outpatient Medications:    amLODipine  (NORVASC ) 5 MG tablet, Take 1 tablet (5 mg total) by mouth daily., Disp: 90 tablet, Rfl: 1   atorvastatin  (LIPITOR) 20 MG tablet, Take 1 tablet (20 mg total) by mouth daily., Disp: 90 tablet, Rfl: 3   buPROPion  (WELLBUTRIN  XL) 150 MG 24 hr tablet, Take 1 tablet (150 mg total) by mouth daily., Disp: , Rfl:    Cholecalciferol (VITAMIN D ) 50 MCG (2000 UT) tablet, Take 2,000 Units by mouth daily., Disp: , Rfl:    Cyanocobalamin (B-12) 2500 MCG TABS, Take 2,500 mcg by mouth daily., Disp: , Rfl:    cyclobenzaprine  (FLEXERIL ) 10 MG tablet, Take 10 mg by mouth 3 (three) times daily as needed for muscle spasms., Disp: , Rfl:    Magnesium 250 MG CAPS, Take 250 mg by mouth daily., Disp: , Rfl:    Multiple Vitamin (MULTIVITAMIN WITH MINERALS) TABS tablet, Take 1 tablet by mouth daily., Disp: , Rfl:    Omega 3-6-9 Fatty Acids  (TRIPLE OMEGA COMPLEX PO), Take 1 capsule by mouth daily., Disp: , Rfl:    tadalafil  (CIALIS ) 5 MG tablet, May take 1 daily, Disp: 30 tablet, Rfl: 5   zinc gluconate 50 MG tablet, Take 50 mg by mouth daily., Disp: , Rfl:    lisinopril  (ZESTRIL ) 20 MG tablet, Take 1 tablet (20 mg total) by mouth daily., Disp: 90 tablet, Rfl: 3   Objective:     BP 128/76 (BP Location: Right Arm, Patient Position: Sitting, Cuff Size: Large)   Pulse 76   Temp (!) 97.4 F (36.3 C) (Temporal)   Ht 6' 2 (1.88 m)   Wt 241 lb 12.8 oz (109.7 kg)   SpO2 96%   BMI 31.05 kg/m  BP Readings from Last 3 Encounters:  03/26/24 128/76  02/17/24 134/78  02/09/24 120/78   Wt Readings from Last 3 Encounters:  03/26/24 241 lb 12.8 oz (109.7 kg)  02/17/24 239 lb 3.2 oz (108.5 kg)  02/09/24 243 lb (110.2 kg)      Physical Exam Constitutional:      General: He is not in acute distress.    Appearance: Normal appearance. He is not ill-appearing, toxic-appearing or diaphoretic.  HENT:     Head: Normocephalic and atraumatic.     Right Ear: External ear normal.     Left Ear: External ear normal.  Eyes:  General: No scleral icterus.       Right eye: No discharge.        Left eye: No discharge.     Extraocular Movements: Extraocular movements intact.     Conjunctiva/sclera: Conjunctivae normal.  Pulmonary:     Effort: Pulmonary effort is normal. No respiratory distress.  Skin:    General: Skin is warm and dry.  Neurological:     Mental Status: He is alert and oriented to person, place, and time.  Psychiatric:        Mood and Affect: Mood normal.        Behavior: Behavior normal.      No results found for any visits on 03/26/24.    The 10-year ASCVD risk score (Arnett DK, et al., 2019) is: 15.6%    Assessment & Plan:   Elevated LDL cholesterol level -     Atorvastatin  Calcium ; Take 1 tablet (20 mg total) by mouth daily.  Dispense: 90 tablet; Refill: 3  Essential hypertension -     Lisinopril ;  Take 1 tablet (20 mg total) by mouth daily.  Dispense: 90 tablet; Refill: 3    Return in about 3 months (around 06/26/2024).  Continue amlodipine  and lisinopril  for hypertension.  Will start atorvastatin  to lower LDL cholesterol with elevated 10-year risk score.  Continue weight loss efforts.  Information was given on atorvastatin .  He will let me know about any unusual myalgias.  Elsie Sim Lent, MD

## 2024-05-06 NOTE — Progress Notes (Signed)
 Jonathan Edwards [facility] [address] [place] [address] Phone: [phone] Subjective:   Jonathan Edwards, am serving as a scribe for Dr. Arthea Edwards.  I'm seeing this patient by the request  of:  Jonathan Elsie Sayre, MD  CC: Right knee pain  YEP:Dlagzrupcz  02-09-2024 Chronic problem with worsening pain, some of it is some atrophy secondary to not being active after patient's hernia surgery that was complicated by an abscess. No sign of any significant redness or warmth. Given an injection and hopeful that this will be beneficial. Discussed the importance of keeping BMI under 35 and if anything under 30 would be better. Goal weight would be at least 225. Follow-up with me again 2 to 3 months. Could be a candidate for viscosupplementation.   Update 05/11/2024 Jonathan Edwards is a 63 y.o. male coming in with complaint of R knee pain. Patient states that he is sore and tight but not any worse than last visit. Injec       Past Medical History:  Diagnosis Date   Anxiety    Cancer [facility])    prostate  2021   Cataract    Depression    GERD (gastroesophageal reflux disease)    Glaucoma    Hypertension    Past Surgical History:  Procedure Laterality Date   COLONOSCOPY  2011   EYE SURGERY     hole repair left eye and detached retina surgery   HERNIA REPAIR     umbilical with mesh   INCISIONAL HERNIA REPAIR N/A 06/30/2023   Procedure: open incisional hernia repair with mesh;  Surgeon: Polly Cordella LABOR, MD;  Location: [facility];  Service: General;  Laterality: N/A;   IR RADIOLOGIST EVAL & MGMT  08/25/2023   LAPAROSCOPIC LYSIS OF ADHESIONS N/A 03/06/2020   Procedure: LAPAROSCOPIC LYSIS OF ADHESIONS;  Surgeon: Carolee Sherwood JONETTA DOUGLAS, MD;  Location: [facility];  Service: Urology;  Laterality: N/A;   LAPAROTOMY N/A 07/02/2023   Procedure: EXPLORATORY LAPAROTOMY WITH HEMATOMA WASH OUT;  Surgeon: Polly Cordella LABOR, MD;  Location: [facility];  Service: General;  Laterality:  N/A;   PELVIC LYMPH NODE DISSECTION Bilateral 03/06/2020   Procedure: BILATERAL PELVIC LYMPH NODE DISSECTION;  Surgeon: Carolee Sherwood JONETTA DOUGLAS, MD;  Location: [facility];  Service: Urology;  Laterality: Bilateral;   ROBOT ASSISTED LAPAROSCOPIC RADICAL PROSTATECTOMY N/A 03/06/2020   Procedure: XI ROBOTIC ASSISTED LAPAROSCOPIC RADICAL PROSTATECTOMY;  Surgeon: Carolee Sherwood JONETTA DOUGLAS, MD;  Location: [facility];  Service: Urology;  Laterality: N/A;   TONSILLECTOMY     Social History   Socioeconomic History   Marital status: Married    Spouse name: Not on file   Number of children: Not on file   Years of education: Not on file   Highest education level: Not on file  Occupational History   Not on file  Tobacco Use   Smoking status: Former    Current packs/day: 0.00    Types: Cigarettes    Start date: 08/12/1968    Quit date: 08/12/1998    Years since quitting: 25.7   Smokeless tobacco: Never  Vaping Use   Vaping status: Never Used  Substance and Sexual Activity   Alcohol use: Yes    Comment: occasional   Drug use: No   Sexual activity: Yes    Birth control/protection: None  Other Topics Concern   Not on file  Social History Narrative   Not on file   Social Drivers of Health   Financial  Resource Strain: Not on file  Food Insecurity: No Food Insecurity (08/03/2023)   Hunger Vital Sign    Worried About Running Out of Food in the Last Year: Never true    Ran Out of Food in the Last Year: Never true  Recent Concern: Food Insecurity - Food Insecurity Present (06/30/2023)   Hunger Vital Sign    Worried About Running Out of Food in the Last Year: Never true    Ran Out of Food in the Last Year: Sometimes true  Transportation Needs: No Transportation Needs (08/03/2023)   PRAPARE - Administrator, Civil Service (Medical): No    Lack of Transportation (Non-Medical): No  Physical Activity: Not on file  Stress: Not on file  Social Connections: Not on file   No Known Allergies Family History   Problem Relation Age of Onset   Colon cancer Neg Hx    Colon polyps Neg Hx    Esophageal cancer Neg Hx    Rectal cancer Neg Hx    Stomach cancer Neg Hx      Current Outpatient Medications (Cardiovascular):    amLODipine  (NORVASC ) 5 MG tablet, Take 1 tablet (5 mg total) by mouth daily.   atorvastatin  (LIPITOR) 20 MG tablet, Take 1 tablet (20 mg total) by mouth daily.   lisinopril  (ZESTRIL ) 20 MG tablet, Take 1 tablet (20 mg total) by mouth daily.   tadalafil  (CIALIS ) 5 MG tablet, May take 1 daily    Current Outpatient Medications (Hematological):    Cyanocobalamin (B-12) 2500 MCG TABS, Take 2,500 mcg by mouth daily.  Current Outpatient Medications (Other):    buPROPion  (WELLBUTRIN  XL) 150 MG 24 hr tablet, Take 1 tablet (150 mg total) by mouth daily.   Cholecalciferol (VITAMIN D ) 50 MCG (2000 UT) tablet, Take 2,000 Units by mouth daily.   cyclobenzaprine  (FLEXERIL ) 10 MG tablet, Take 10 mg by mouth 3 (three) times daily as needed for muscle spasms.   Magnesium 250 MG CAPS, Take 250 mg by mouth daily.   Multiple Vitamin (MULTIVITAMIN WITH MINERALS) TABS tablet, Take 1 tablet by mouth daily.   Omega 3-6-9 Fatty Acids (TRIPLE OMEGA COMPLEX PO), Take 1 capsule by mouth daily.   zinc gluconate 50 MG tablet, Take 50 mg by mouth daily.   Reviewed prior external information including notes and imaging from  primary care provider As well as notes that were available from care everywhere and other healthcare systems.  Past medical history, social, surgical and family history all reviewed in electronic medical record.  No pertanent information unless stated regarding to the chief complaint.   Review of Systems:  No headache, visual changes, nausea, vomiting, diarrhea, constipation, dizziness, abdominal pain, skin rash, fevers, chills, night sweats, weight loss, swollen lymph nodes, body aches, joint swelling, chest pain, shortness of breath, mood changes. POSITIVE muscle aches  Objective   Blood pressure 128/88, pulse 86, height 6' 2 (1.88 m), weight 238 lb (108 kg), SpO2 97%.   General: No apparent distress alert and oriented x3 mood and affect normal, dressed appropriately.  HEENT: Pupils equal, extraocular movements intact  Respiratory: Patient's speak in full sentences and does not appear short of breath  Cardiovascular: No lower extremity edema, non tender, no erythema  Right knee exam shows arthritic changes noted.  Antalgic gait noted.  Varus deformity of the knee noted.  Limited lacking the last 5 degrees of extension in the last 10 degrees of flexion of the right knee.  Limited muscular skeletal ultrasound was performed  and interpreted by Edwards HUSSAR, M  Limited ultrasound shows the patient does have some hypoechoic changes noted seems like a fairly large effusion noted of the patellofemoral joint space.   After informed written and verbal consent, patient was seated on exam table. Right knee was prepped with alcohol swab and utilizing anterolateral approach, patient's right knee space was injected with 4:1  marcaine  0.5%: Kenalog  40mg /dL. Patient tolerated the procedure well without immediate complications. Impression and Recommendations:     The above documentation has been reviewed and is accurate and complete Jsoeph Podesta M Leanthony Rhett, DO

## 2024-05-11 ENCOUNTER — Telehealth: Payer: Self-pay

## 2024-05-11 ENCOUNTER — Encounter: Payer: Self-pay | Admitting: Family Medicine

## 2024-05-11 ENCOUNTER — Other Ambulatory Visit: Payer: Self-pay

## 2024-05-11 ENCOUNTER — Ambulatory Visit: Admitting: Family Medicine

## 2024-05-11 VITALS — BP 128/88 | HR 86 | Ht 74.0 in | Wt 238.0 lb

## 2024-05-11 DIAGNOSIS — M25561 Pain in right knee: Secondary | ICD-10-CM | POA: Diagnosis not present

## 2024-05-11 DIAGNOSIS — M1711 Unilateral primary osteoarthritis, right knee: Secondary | ICD-10-CM | POA: Diagnosis not present

## 2024-05-11 NOTE — Assessment & Plan Note (Signed)
 Severe arthritis noted.  Still holding off on any surgical intervention.  Given another injection that I am hoping will make significant improvement.  Discussed icing regimen and home exercises, increase activity slowly.  Follow-up with me again in 3 months time.

## 2024-05-11 NOTE — Telephone Encounter (Signed)
-----   Message from [name] sent at 05/11/2024  9:34 AM EDT ----- Regarding: visco Can you please run patient for visco for R knee?  Thanks

## 2024-05-11 NOTE — Patient Instructions (Signed)
 Injected R knee today We will get gel approved See me in 8-10 weeks

## 2024-05-11 NOTE — Telephone Encounter (Signed)
 Patient ran for Synvisc via fax on 05/11/24. Pending approval.

## 2024-05-18 ENCOUNTER — Ambulatory Visit: Admitting: Family Medicine

## 2024-05-27 ENCOUNTER — Telehealth: Payer: Self-pay | Admitting: Family Medicine

## 2024-05-27 NOTE — Telephone Encounter (Signed)
 error

## 2024-06-23 NOTE — Telephone Encounter (Addendum)
 Called insurance to find out what the status is on the authorization for Synvisc for this patient. Checked multiple times with our prior authorization coordinator and she did not get any information back from the insurance.  Representative stated that they did receive the fax on 05/11/24. No prior authorization was required for [id number] through medication insurance. Today there is no prior authorization needed under the medical insurance for [id number] and [phone].  No paperwork is available to fax with this information attached to it per the rep.  Reference number: [id number] 05/11/24 Reference number: [id number] 06/23/24

## 2024-06-24 ENCOUNTER — Other Ambulatory Visit: Payer: Self-pay | Admitting: Family Medicine

## 2024-06-24 DIAGNOSIS — F341 Dysthymic disorder: Secondary | ICD-10-CM

## 2024-06-24 MED ORDER — BUPROPION HCL ER (XL) 150 MG PO TB24: 150.0000 mg | ORAL_TABLET | Freq: Every day | ORAL | 1 refills | Status: AC

## 2024-06-24 NOTE — Telephone Encounter (Signed)
 Copied from CRM [id number]. Topic: Clinical - Medication Refill >> Jun 24, 2024  8:10 AM Rosina BIRCH wrote: Medication: buPROPion    Has the patient contacted their pharmacy? No (Agent: If no, request that the patient contact the pharmacy for the refill. If patient does not wish to contact the pharmacy document the reason why and proceed with request.) (Agent: If yes, when and what did the pharmacy advise?)  This is the patient's preferred pharmacy:  [id number] [address], [place] - [address] [place] [address] Phone: [phone] Fax: [phone]  Is this the correct pharmacy for this prescription? Yes If no, delete pharmacy and type the correct one.   Has the prescription been filled recently? No  Is the patient out of the medication? No  Has the patient been seen for an appointment in the last year OR does the patient have an upcoming appointment? Yes  Can we respond through MyChart? Yes  Agent: Please be advised that Rx refills may take up to 3 business days. We ask that you follow-up with your pharmacy.

## 2024-06-25 ENCOUNTER — Ambulatory Visit: Admitting: Family Medicine

## 2024-08-09 ENCOUNTER — Ambulatory Visit: Admitting: Family Medicine

## 2024-08-09 ENCOUNTER — Encounter: Payer: Self-pay | Admitting: Family Medicine

## 2024-08-09 VITALS — BP 132/84 | HR 88 | Temp 98.4°F | Ht 74.0 in | Wt 242.8 lb

## 2024-08-09 DIAGNOSIS — Z566 Other physical and mental strain related to work: Secondary | ICD-10-CM

## 2024-08-09 DIAGNOSIS — R7303 Prediabetes: Secondary | ICD-10-CM | POA: Diagnosis not present

## 2024-08-09 DIAGNOSIS — E7800 Pure hypercholesterolemia, unspecified: Secondary | ICD-10-CM

## 2024-08-09 DIAGNOSIS — I10 Essential (primary) hypertension: Secondary | ICD-10-CM

## 2024-08-09 MED ORDER — AMLODIPINE BESYLATE 5 MG PO TABS: 5.0000 mg | ORAL_TABLET | Freq: Every day | ORAL | 1 refills | Status: AC

## 2024-08-09 MED ORDER — AMLODIPINE BESYLATE 10 MG PO TABS: 10.0000 mg | ORAL_TABLET | Freq: Every day | ORAL | 1 refills | Status: DC

## 2024-08-09 NOTE — Progress Notes (Unsigned)
 " [facility] Claud[facility] [address] [place] [address] Phone: [phone] Subjective:   Jonathan Edwards, am serving as a scribe for Dr. Arthea Claudene.  I'm seeing this patient by the request  of:  Berneta Elsie Sayre, MD  CC: Right knee pain  YEP:Dlagzrupcz  Jonathan Edwards is a 63 y.o. male coming in with complaint of R knee pain. Here for Synvisc One injection. Patient states that his R knee is doing ok.        Past Medical History:  Diagnosis Date   Anxiety    Cancer [facility])    prostate  2021   Cataract    Depression    GERD (gastroesophageal reflux disease)    Glaucoma    Hypertension    Past Surgical History:  Procedure Laterality Date   COLONOSCOPY  2011   EYE SURGERY     hole repair left eye and detached retina surgery   HERNIA REPAIR     umbilical with mesh   INCISIONAL HERNIA REPAIR N/A 06/30/2023   Procedure: open incisional hernia repair with mesh;  Surgeon: Polly Cordella LABOR, MD;  Location: [facility];  Service: General;  Laterality: N/A;   IR RADIOLOGIST EVAL & MGMT  08/25/2023   LAPAROSCOPIC LYSIS OF ADHESIONS N/A 03/06/2020   Procedure: LAPAROSCOPIC LYSIS OF ADHESIONS;  Surgeon: Carolee Sherwood JONETTA DOUGLAS, MD;  Location: [facility];  Service: Urology;  Laterality: N/A;   LAPAROTOMY N/A 07/02/2023   Procedure: EXPLORATORY LAPAROTOMY WITH HEMATOMA WASH OUT;  Surgeon: Polly Cordella LABOR, MD;  Location: [facility];  Service: General;  Laterality: N/A;   PELVIC LYMPH NODE DISSECTION Bilateral 03/06/2020   Procedure: BILATERAL PELVIC LYMPH NODE DISSECTION;  Surgeon: Carolee Sherwood JONETTA DOUGLAS, MD;  Location: [facility];  Service: Urology;  Laterality: Bilateral;   ROBOT ASSISTED LAPAROSCOPIC RADICAL PROSTATECTOMY N/A 03/06/2020   Procedure: XI ROBOTIC ASSISTED LAPAROSCOPIC RADICAL PROSTATECTOMY;  Surgeon: Carolee Sherwood JONETTA DOUGLAS, MD;  Location: [facility];  Service: Urology;  Laterality: N/A;   TONSILLECTOMY     Social History   Socioeconomic History   Marital status:  Married    Spouse name: Not on file   Number of children: Not on file   Years of education: Not on file   Highest education level: Not on file  Occupational History   Not on file  Tobacco Use   Smoking status: Former    Current packs/day: 0.00    Types: Cigarettes    Start date: 08/12/1968    Quit date: 08/12/1998    Years since quitting: 26.0   Smokeless tobacco: Never  Vaping Use   Vaping status: Never Used  Substance and Sexual Activity   Alcohol use: Yes    Comment: occasional   Drug use: No   Sexual activity: Yes    Birth control/protection: None  Other Topics Concern   Not on file  Social History Narrative   Not on file   Social Drivers of Health   Tobacco Use: Medium Risk (08/09/2024)   Patient History    Smoking Tobacco Use: Former    Smokeless Tobacco Use: Never    Passive Exposure: Not on Actuary Strain: Not on file  Food Insecurity: No Food Insecurity (08/03/2023)   Hunger Vital Sign    Worried About Running Out of Food in the Last Year: Never true    Ran Out of Food in the Last Year: Never true  Recent Concern: Food Insecurity - Food Insecurity Present (  06/30/2023)   Hunger Vital Sign    Worried About Running Out of Food in the Last Year: Never true    Ran Out of Food in the Last Year: Sometimes true  Transportation Needs: No Transportation Needs (08/03/2023)   PRAPARE - Administrator, Civil Service (Medical): No    Lack of Transportation (Non-Medical): No  Physical Activity: Not on file  Stress: Not on file  Social Connections: Not on file  Depression (PHQ2-9): Low Risk (08/09/2024)   Depression (PHQ2-9)    PHQ-2 Score: 4  Alcohol Screen: Not on file  Housing: Unknown (08/19/2023)   Received from [facility]   Epic    At any time in the past 12 months, were you homeless or living in a shelter (including now)?: No    Number of Times Moved in the Last Year: Not on file    Unable to Pay for Housing in the  Last Year: Not on file  Utilities: Not At Risk (08/03/2023)   [employer] Utilities    Threatened with loss of utilities: No  Recent Concern: Utilities - At Risk (06/30/2023)   [employer] Utilities    Threatened with loss of utilities: Yes  Health Literacy: Not on file   Allergies[1] Family History  Problem Relation Age of Onset   Colon cancer Neg Hx    Colon polyps Neg Hx    Esophageal cancer Neg Hx    Rectal cancer Neg Hx    Stomach cancer Neg Hx     Current Outpatient Medications (Cardiovascular):    amLODipine  (NORVASC ) 5 MG tablet, Take 1 tablet (5 mg total) by mouth daily.   atorvastatin  (LIPITOR) 20 MG tablet, Take 1 tablet (20 mg total) by mouth daily.   lisinopril  (ZESTRIL ) 20 MG tablet, Take 1 tablet (20 mg total) by mouth daily.   tadalafil  (CIALIS ) 5 MG tablet, May take 1 daily  Current Outpatient Medications (Hematological):    Cyanocobalamin  (B-12) 2500 MCG TABS, Take 2,500 mcg by mouth daily.  Current Outpatient Medications (Other):    buPROPion  (WELLBUTRIN  XL) 150 MG 24 hr tablet, Take 1 tablet (150 mg total) by mouth daily.   Cholecalciferol (VITAMIN D ) 50 MCG (2000 UT) tablet, Take 2,000 Units by mouth daily.   cyclobenzaprine  (FLEXERIL ) 10 MG tablet, Take 10 mg by mouth 3 (three) times daily as needed for muscle spasms.   Magnesium 250 MG CAPS, Take 250 mg by mouth daily.   Multiple Vitamin (MULTIVITAMIN WITH MINERALS) TABS tablet, Take 1 tablet by mouth daily.   Omega 3-6-9 Fatty Acids (TRIPLE OMEGA COMPLEX PO), Take 1 capsule by mouth daily.   zinc gluconate 50 MG tablet, Take 50 mg by mouth daily.   Objective  Blood pressure 108/79, pulse 68, height 6' 2 (1.88 m), weight 243 lb (110.2 kg), SpO2 98%.   General: No apparent distress alert and oriented x3 mood and affect normal, dressed appropriately.  HEENT: Pupils equal, extraocular movements intact  Respiratory: Patient's speak in full sentences and does not appear short of breath  Cardiovascular: No lower  extremity edema, non tender, no erythema  Right knee exam shows changes noted in the medial knee but nothing significant with any effusion.  Actually improvement in range of motion from patient's usual baseline.  Patient able to ambulate without any significant difficulty but does have some mild antalgic gait noted.    Impression and Recommendations:    The above documentation has been reviewed and is accurate and complete Kirah Stice M  Graceann Boileau, DO        [1] No Known Allergies  "

## 2024-08-09 NOTE — Progress Notes (Signed)
 "  Established Patient Office Visit   Subjective:  Patient ID: Jonathan Edwards, male    DOB: [birth year]  Age: 63 y.o. MRN: [id number]  Chief Complaint  Patient presents with   Medical Management of Chronic Issues    3 mon f/u, No questions or concerns. Pt states when he does stuff he sometimes he does a little stumble. State he is not swimmy head nor does he have any dizziness.     HPI Encounter Diagnoses  Name Primary?   Essential hypertension Yes   Elevated LDL cholesterol level    Prediabetes    Work-related stress    For follow-up of above.  Has yet to start the atorvastatin  after his wife suggested that they changed their diet.  Admits that there has not been any dietary changes.  Assures compliance with amlodipine  5 mg daily and lisinopril  20.  Things at work continue to be stressful.  The stress has gotten to the point to where he is ready to retire and plans on doing so in the next 3 to 4 months.   Review of Systems  Constitutional: Negative.   HENT: Negative.    Eyes:  Negative for blurred vision, discharge and redness.  Respiratory: Negative.    Cardiovascular: Negative.   Gastrointestinal:  Negative for abdominal pain.  Genitourinary: Negative.   Musculoskeletal: Negative.  Negative for myalgias.  Skin:  Negative for rash.  Neurological:  Negative for tingling, loss of consciousness and weakness.  Endo/Heme/Allergies:  Negative for polydipsia.      08/09/2024   10:48 AM 03/26/2024   10:43 AM 02/17/2024    9:57 AM  Depression screen PHQ 2/9  Decreased Interest 1 0 1  Down, Depressed, Hopeless 1 0 0  PHQ - 2 Score 2 0 1  Altered sleeping 0 1 3  Tired, decreased energy 1 1 1   Change in appetite 0 0 0  Feeling bad or failure about yourself  1 0 1  Trouble concentrating 0 1 0  Moving slowly or fidgety/restless 0 0 0  Suicidal thoughts 0 0 0  PHQ-9 Score 4 3  6    Difficult doing work/chores Very difficult Not difficult at all Somewhat difficult     Data saved  with a previous flowsheet row definition     Current Medications[1]   Objective:     BP 132/84   Pulse 88   Temp 98.4 F (36.9 C)   Ht 6' 2 (1.88 m)   Wt 242 lb 12.8 oz (110.1 kg)   SpO2 96%   BMI 31.17 kg/m  BP Readings from Last 3 Encounters:  08/09/24 132/84  05/11/24 128/88  03/26/24 128/76   Wt Readings from Last 3 Encounters:  08/09/24 242 lb 12.8 oz (110.1 kg)  05/11/24 238 lb (108 kg)  03/26/24 241 lb 12.8 oz (109.7 kg)      Physical Exam Constitutional:      General: He is not in acute distress.    Appearance: Normal appearance. He is not ill-appearing, toxic-appearing or diaphoretic.  HENT:     Head: Normocephalic and atraumatic.     Right Ear: External ear normal.     Left Ear: External ear normal.  Eyes:     General: No scleral icterus.       Right eye: No discharge.        Left eye: No discharge.     Extraocular Movements: Extraocular movements intact.     Conjunctiva/sclera: Conjunctivae normal.  Pulmonary:  Effort: Pulmonary effort is normal. No respiratory distress.  Skin:    General: Skin is warm and dry.  Neurological:     Mental Status: He is alert and oriented to person, place, and time.  Psychiatric:        Mood and Affect: Mood normal.        Behavior: Behavior normal.      No results found for any visits on 08/09/24.    The 10-year ASCVD risk score (Arnett DK, et al., 2019) is: 17.1%    Assessment & Plan:   Essential hypertension -     amLODIPine  Besylate; Take 1 tablet (5 mg total) by mouth daily.  Dispense: 90 tablet; Refill: 1  Elevated LDL cholesterol level  Prediabetes  Work-related stress    Return in about 8 weeks (around 10/04/2024) for chronic disease follow-up.  We discussed the rationale of adding a statin to his medical regimen concerning his elevated ASCVD risk score.  He now understands that with his history of prediabetes and hypertension, he is at higher risk for vascular disease.  He will go ahead  and start the statin.  Continue amlodipine  5 mg with lisinopril  20.  Information was given on preventing type 2 diabetes, managing hypertension and managing elevated cholesterol.  Elsie Sim Lent, MD    [1]  Current Outpatient Medications:    buPROPion  (WELLBUTRIN  XL) 150 MG 24 hr tablet, Take 1 tablet (150 mg total) by mouth daily., Disp: 180 tablet, Rfl: 1   Cholecalciferol (VITAMIN D ) 50 MCG (2000 UT) tablet, Take 2,000 Units by mouth daily., Disp: , Rfl:    Cyanocobalamin  (B-12) 2500 MCG TABS, Take 2,500 mcg by mouth daily., Disp: , Rfl:    cyclobenzaprine  (FLEXERIL ) 10 MG tablet, Take 10 mg by mouth 3 (three) times daily as needed for muscle spasms., Disp: , Rfl:    lisinopril  (ZESTRIL ) 20 MG tablet, Take 1 tablet (20 mg total) by mouth daily., Disp: 90 tablet, Rfl: 3   Magnesium 250 MG CAPS, Take 250 mg by mouth daily., Disp: , Rfl:    Multiple Vitamin (MULTIVITAMIN WITH MINERALS) TABS tablet, Take 1 tablet by mouth daily., Disp: , Rfl:    Omega 3-6-9 Fatty Acids (TRIPLE OMEGA COMPLEX PO), Take 1 capsule by mouth daily., Disp: , Rfl:    tadalafil  (CIALIS ) 5 MG tablet, May take 1 daily, Disp: 30 tablet, Rfl: 5   zinc gluconate 50 MG tablet, Take 50 mg by mouth daily., Disp: , Rfl:    amLODipine  (NORVASC ) 5 MG tablet, Take 1 tablet (5 mg total) by mouth daily., Disp: 90 tablet, Rfl: 1   atorvastatin  (LIPITOR) 20 MG tablet, Take 1 tablet (20 mg total) by mouth daily. (Patient not taking: Reported on 08/09/2024), Disp: 90 tablet, Rfl: 3  "

## 2024-08-10 ENCOUNTER — Ambulatory Visit: Admitting: Family Medicine

## 2024-08-10 VITALS — BP 108/79 | HR 68 | Ht 74.0 in | Wt 243.0 lb

## 2024-08-10 DIAGNOSIS — M1711 Unilateral primary osteoarthritis, right knee: Secondary | ICD-10-CM

## 2024-08-10 NOTE — Patient Instructions (Signed)
 Hold on gel injection See me again in 3 months

## 2024-08-10 NOTE — Assessment & Plan Note (Addendum)
 Arthritic changes noted, patient is doing well though at this time.  No other changes.  Held on viscosupplementation.  Patient will make a follow-up in 2 to 3 months to make sure he continues to do well and then can follow-up with me as needed.  Given a handicap sticker in case.

## 2024-10-05 ENCOUNTER — Ambulatory Visit: Admitting: Family Medicine

## 2024-11-15 ENCOUNTER — Ambulatory Visit: Admitting: Family Medicine
